# Patient Record
Sex: Male | Born: 1940 | Race: White | Hispanic: No | Marital: Married | Smoking: Former smoker
Health system: Southern US, Community
[De-identification: ages and names within clinical notes are randomized; demographics above are authoritative.]

## PROBLEM LIST (undated history)

## (undated) DIAGNOSIS — R351 Nocturia: Secondary | ICD-10-CM

## (undated) DIAGNOSIS — L719 Rosacea, unspecified: Secondary | ICD-10-CM

## (undated) DIAGNOSIS — E785 Hyperlipidemia, unspecified: Secondary | ICD-10-CM

## (undated) DIAGNOSIS — I10 Essential (primary) hypertension: Secondary | ICD-10-CM

## (undated) DIAGNOSIS — K7030 Alcoholic cirrhosis of liver without ascites: Secondary | ICD-10-CM

## (undated) DIAGNOSIS — C4490 Unspecified malignant neoplasm of skin, unspecified: Secondary | ICD-10-CM

## (undated) HISTORY — DX: Hyperlipidemia, unspecified: E78.5

## (undated) HISTORY — DX: Rosacea, unspecified: L71.9

## (undated) HISTORY — DX: Essential (primary) hypertension: I10

## (undated) HISTORY — DX: Unspecified malignant neoplasm of skin, unspecified: C44.90

## (undated) HISTORY — DX: Alcoholic cirrhosis of liver without ascites: K70.30

## (undated) HISTORY — DX: Nocturia: R35.1

## (undated) HISTORY — PX: TONSILLECTOMY: SHX5217

---

## 1998-02-15 ENCOUNTER — Ambulatory Visit (HOSPITAL_COMMUNITY): Admission: RE | Admit: 1998-02-15 | Discharge: 1998-02-15 | Payer: Self-pay | Admitting: Gastroenterology

## 2001-08-18 ENCOUNTER — Encounter (INDEPENDENT_AMBULATORY_CARE_PROVIDER_SITE_OTHER): Payer: Self-pay | Admitting: *Deleted

## 2001-08-18 ENCOUNTER — Ambulatory Visit (HOSPITAL_COMMUNITY): Admission: RE | Admit: 2001-08-18 | Discharge: 2001-08-18 | Payer: Self-pay | Admitting: Gastroenterology

## 2007-06-06 ENCOUNTER — Inpatient Hospital Stay (HOSPITAL_BASED_OUTPATIENT_CLINIC_OR_DEPARTMENT_OTHER): Admission: RE | Admit: 2007-06-06 | Discharge: 2007-06-06 | Payer: Self-pay | Admitting: Cardiovascular Disease

## 2009-06-22 ENCOUNTER — Ambulatory Visit: Admission: RE | Admit: 2009-06-22 | Discharge: 2009-07-11 | Payer: Self-pay | Admitting: Radiation Oncology

## 2009-07-18 ENCOUNTER — Inpatient Hospital Stay (HOSPITAL_COMMUNITY): Admission: RE | Admit: 2009-07-18 | Discharge: 2009-07-19 | Payer: Self-pay | Admitting: Urology

## 2009-07-18 ENCOUNTER — Encounter (INDEPENDENT_AMBULATORY_CARE_PROVIDER_SITE_OTHER): Payer: Self-pay | Admitting: Urology

## 2010-01-29 HISTORY — PX: OTHER SURGICAL HISTORY: SHX169

## 2010-04-16 LAB — TYPE AND SCREEN: ABO/RH(D): B POS

## 2010-04-16 LAB — HEMOGLOBIN AND HEMATOCRIT, BLOOD
HCT: 32.6 % — ABNORMAL LOW (ref 39.0–52.0)
Hemoglobin: 13.1 g/dL (ref 13.0–17.0)

## 2010-04-16 LAB — ABO/RH: ABO/RH(D): B POS

## 2010-04-17 LAB — BASIC METABOLIC PANEL
BUN: 18 mg/dL (ref 6–23)
Calcium: 9.8 mg/dL (ref 8.4–10.5)
Glucose, Bld: 106 mg/dL — ABNORMAL HIGH (ref 70–99)
Potassium: 3.7 mEq/L (ref 3.5–5.1)
Sodium: 139 mEq/L (ref 135–145)

## 2010-04-17 LAB — CBC
HCT: 41.7 % (ref 39.0–52.0)
Hemoglobin: 14.7 g/dL (ref 13.0–17.0)
MCHC: 35.4 g/dL (ref 30.0–36.0)
RBC: 4.23 MIL/uL (ref 4.22–5.81)
RDW: 12 % (ref 11.5–15.5)
WBC: 5.5 10*3/uL (ref 4.0–10.5)

## 2010-06-13 NOTE — Cardiovascular Report (Signed)
NAME:  [name], [name] NO.:  [id number]   MEDICAL RECORD NO.:  [id number]          PATIENT TYPE:  OIB   LOCATION:  1965                         FACILITY:  [facility]   PHYSICIAN:  Vesta Mixer, M.D. DATE OF BIRTH:  [birth year]   DATE OF PROCEDURE:  DATE OF DISCHARGE:                            CARDIAC CATHETERIZATION   HISTORY:  Devin Reeves is a 70 year old gentleman with a recent onset  of chest pain with exertion.  He is referred for heart catheterization  for further evaluation.   PROCEDURE:  Left heart catheterization with coronary angiography.   The right femoral artery was easily cannulated using a modified  Seldinger technique.   HEMODYNAMIC RESULTS:  LV pressure was 130/12 with an aortic pressure of  126/60.   Angiography, left main.  The left main is fairly normal.  There is a  small amount of calcification.   Left anterior descending artery is mildly to moderately calcified.  There is mild irregularities in the proximal segment between 20 and 30%.  There is a relatively small diagonal artery, which is unremarkable.  The  remainder of LAD is unremarkable.   The left circumflex artery is large.  There are minor luminal  irregularities.  There is a large first obtuse marginal system.   Right coronary artery is large and dominant.  There is a 20-30% mid  stent stenosis followed by a 20% stenosis more distally.  The posterior  descending artery is a moderate-sized vessel, which is unremarkable.   Left ventriculogram was performed in the 30 RAO position.  It reveals  normal left ventricular systolic function.  Ejection fraction is about  55%.  There is no significant mitral regurgitation.   COMPLICATIONS:  None.   CONCLUSION:  Minimal coronary artery irregularities.  I did not see any  culprit lesion to explain his episodes of chest pain.  We will continue  with medical therapy.           ______________________________  Vesta Mixer,  M.D.     PJN/MEDQ  D:  06/06/2007  T:  06/06/2007  Job:  [id number]   cc:   Marjory Lies, M.D.

## 2010-06-13 NOTE — H&P (Signed)
NAME:  Devin Reeves, Devin Reeves NO.:  [id number]   MEDICAL RECORD NO.:  [id number]           PATIENT TYPE:   LOCATION:                                 FACILITY:   PHYSICIAN:  Vesta Mixer, M.D.      DATE OF BIRTH:   DATE OF ADMISSION:  DATE OF DISCHARGE:                              HISTORY & PHYSICAL   [name] is a middle-aged gentleman with a recent onset of chest  pain.  He has been referred for heart catheterization for further  evaluation.   [name] is a 70 year old gentleman with a history of hypertension  and hypercholesterolemia.  He has been quite active and has been quite  healthy for many years.  He typically walks approximately 4 miles 5 days  a week.  He has done this for a year, tells he has never had any  problems.   Starting this past Sunday, he developed intense chest pressure with  walking.  He walks to the golf course at [facility] and really pushes it  going up to 18th hole.  On Sunday, he developed some intense chest  pressure.  It eased off when he stopped walking.  He has walked several  times since that time, but really has not pushed it quite the same way.  He has noticed similar episodes of chest discomfort, but not as intense.  He does not have any episodes of chest pain at rest.  He denies any PND  or orthopnea.  He denies any heat or cold intolerance.   CURRENT MEDICATIONS:  1. Altace 10 mg a day.  2. Hydrochlorothiazide 25 mg a day.  3. Aspirin 81 mg a day.  4. Multivitamin once a day.   ALLERGIES:  He is allergic to STEROIDS and is allergic to OYSTERS.   PAST MEDICAL HISTORY:  1. Hypertension.  2. Hypercholesterolemia.   SOCIAL HISTORY:  The patient used to smoke, but quit 3-4 years ago.  He  drinks a bottle wine per day.  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  He walks 4 miles a day 5 days a week.   FAMILY HISTORY:  Father died in his 41s due to unknown causes.  His  mother is 60 years  old, who is relatively healthy.   REVIEW OF SYSTEMS:  Reviewed and is essentially negative, except for as  noted in the HPI.   PHYSICAL EXAMINATION:  On exam,  GENERAL:  He is a middle-aged gentleman in no acute distress.  He is  alert and oriented x3, and his mood and affect are normal.  VITAL SIGNS:  His weight is 173, blood pressure 140/70 with a heart rate  of 72.  HEENT EXAM:  Reveals 2+ carotids.  He has no bruits, no JVD, and no  thyromegaly.  LUNGS:  Clear to auscultation.  HEART:  Regular rate S1-S2.  ABDOMINAL EXAM:  Reveals good bowel sounds and is nontender.  EXTREMITIES:  He has no clubbing, cyanosis or edema.  NEUROLOGICAL EXAM:  Nonfocal.   His EKG from Dr. Lucie Leather office reveals sinus bradycardia.  There  are no ST or T-wave changes.   [name] presents with episodes of chest pain that are somewhat worrisome for  unstable angina.  He had intense chest pain while walking briskly and  has had lesser episodes of chest pain while walking slower.  At this  point, I think that we should refer him for heart catheterization.  I do  not think that a stress test would help as much since he is having  angina every time he walks.  We will schedule him for an outpatient  heart catheterization.  We have discussed the risks, benefits and  options of heart catheterization.  He understands and agrees to proceed.  All of his other medical problems remain stable.           ______________________________  Vesta Mixer, M.D.     PJN/MEDQ  D:  06/05/2007  T:  06/06/2007  Job:  [id number]   cc:   Marjory Lies, M.D.

## 2010-06-16 NOTE — Procedures (Signed)
[facility]  Patient:    Devin Reeves, Devin Reeves Visit Number: [id number] MRN: [id number]          Service Type: END Location: ENDO Attending Physician:  Rich Brave Dictated by:   Florencia Reasons, M.D. Proc. Date: 08/18/01 Admit Date:  08/18/2001 Discharge Date: 08/18/2001   CC:         Delorse Lek, M.D.   Procedure Report  PROCEDURE:  Colonoscopy with biopsies.  SURGEON:  Florencia Reasons, M.D.  INDICATIONS:  A 70 year old for follow up of prior history of a small colonic adenoma, having been removed approximately four or five years ago.  His most recent surveillance examination, 3-1/2 years ago, was negative for any adenomas.  FINDINGS:  Scattered pancolonic diverticulosis.  DESCRIPTION OF PROCEDURE:  The nature, purpose, and risks of the procedure were familiar to the patient from prior examination, and he provided written consent.  Sedation was fentanyl 50 mcg and Versed 6 mg IV without arrhythmias or desaturation.  The Olympus adult video colonoscope was advanced to the cecum without difficulty following an unremarkable digital exam of the prostate.  There was some caution required in the sigmoid region due to diverticular there, and on at least one occasion, there was a diverticular orifice that looked like the lumen.  Pull back was then performed.  The quality of the prep was very good and it was felt that all areas were well-seen.  The patient had several (approximately six) diminutive 2 mm sessile polyps in the rectum, starting at 20 cm, and continuing down to the anal canal.  These were removed by one or more cold biopsies each.  No large polyps, cancer, colitis, or vascular malformations were observed.  Antegrade viewing disclosed no large or worrisome distal rectal pathology. Reinspection of the rectosigmoid disclosed no additional findings.  The patient did have moderate sigmoid diverticulosis and mild  right-sided diverticulosis.  The patient tolerated the procedure well and there were no apparent complications.  IMPRESSION: 1. Several diminutive rectal polyps, removed.  None of these were worrisome in    appearance. 2. Pancolonic diverticulosis.  PLAN:  Await pathology.  Probable colonoscopic follow up in five years. Dictated by:   Florencia Reasons, M.D. Attending Physician:  Rich Brave DD:  08/18/01 TD:  08/21/01 Job: [id number]

## 2011-05-10 ENCOUNTER — Encounter: Payer: Self-pay | Admitting: *Deleted

## 2011-05-10 DIAGNOSIS — R351 Nocturia: Secondary | ICD-10-CM | POA: Insufficient documentation

## 2011-07-10 ENCOUNTER — Other Ambulatory Visit: Payer: Self-pay

## 2011-07-16 ENCOUNTER — Other Ambulatory Visit: Payer: Self-pay | Admitting: Dermatology

## 2013-12-02 ENCOUNTER — Emergency Department (HOSPITAL_COMMUNITY)
Admission: EM | Admit: 2013-12-02 | Discharge: 2013-12-02 | Disposition: A | Discharge status: Home / Self Care | Payer: Medicare Other | Attending: Emergency Medicine | Admitting: Emergency Medicine

## 2013-12-02 ENCOUNTER — Encounter (HOSPITAL_COMMUNITY): Payer: Self-pay

## 2013-12-02 DIAGNOSIS — W228XXA Striking against or struck by other objects, initial encounter: Secondary | ICD-10-CM | POA: Insufficient documentation

## 2013-12-02 DIAGNOSIS — Z23 Encounter for immunization: Secondary | ICD-10-CM | POA: Insufficient documentation

## 2013-12-02 DIAGNOSIS — Y9389 Activity, other specified: Secondary | ICD-10-CM | POA: Insufficient documentation

## 2013-12-02 DIAGNOSIS — Z7982 Long term (current) use of aspirin: Secondary | ICD-10-CM | POA: Diagnosis not present

## 2013-12-02 DIAGNOSIS — Z872 Personal history of diseases of the skin and subcutaneous tissue: Secondary | ICD-10-CM | POA: Diagnosis not present

## 2013-12-02 DIAGNOSIS — S0181XA Laceration without foreign body of other part of head, initial encounter: Secondary | ICD-10-CM

## 2013-12-02 DIAGNOSIS — Z8639 Personal history of other endocrine, nutritional and metabolic disease: Secondary | ICD-10-CM | POA: Diagnosis not present

## 2013-12-02 DIAGNOSIS — I10 Essential (primary) hypertension: Secondary | ICD-10-CM | POA: Insufficient documentation

## 2013-12-02 DIAGNOSIS — Y9289 Other specified places as the place of occurrence of the external cause: Secondary | ICD-10-CM | POA: Insufficient documentation

## 2013-12-02 DIAGNOSIS — S0511XA Contusion of eyeball and orbital tissues, right eye, initial encounter: Secondary | ICD-10-CM

## 2013-12-02 DIAGNOSIS — Z87891 Personal history of nicotine dependence: Secondary | ICD-10-CM | POA: Diagnosis not present

## 2013-12-02 DIAGNOSIS — Z79899 Other long term (current) drug therapy: Secondary | ICD-10-CM | POA: Diagnosis not present

## 2013-12-02 DIAGNOSIS — S0591XA Unspecified injury of right eye and orbit, initial encounter: Secondary | ICD-10-CM | POA: Diagnosis present

## 2013-12-02 MED ORDER — TETRACAINE HCL 0.5 % OP SOLN
2.0000 [drp] | Freq: Once | OPHTHALMIC | Status: AC
  Administered 2013-12-02: 2 [drp] via OPHTHALMIC
  Filled 2013-12-02: qty 2

## 2013-12-02 MED ORDER — FLUORESCEIN SODIUM 1 MG OP STRP
1.0000 | ORAL_STRIP | Freq: Once | OPHTHALMIC | Status: AC
  Administered 2013-12-02: 1 via OPHTHALMIC
  Filled 2013-12-02: qty 1

## 2013-12-02 MED ORDER — TETANUS-DIPHTH-ACELL PERTUSSIS 5-2.5-18.5 LF-MCG/0.5 IM SUSP
0.5000 mL | Freq: Once | INTRAMUSCULAR | Status: AC
  Administered 2013-12-02: 0.5 mL via INTRAMUSCULAR
  Filled 2013-12-02: qty 0.5

## 2013-12-02 NOTE — Discharge Instructions (Signed)
Eye Contusion An eye contusion is a deep bruise of the eye. This is often called a "black eye." Contusions are the result of an injury that caused bleeding under the skin. The contusion may turn blue, purple, or yellow. Minor injuries will give you a painless contusion, but more severe contusions may stay painful and swollen for a few weeks. If the eye contusion only involves the eyelids and tissues around the eye, the injured area will get better within a few days to weeks. However, eye contusions can be serious and affect the eyeball and sight. CAUSES   Blunt injury or trauma to the face or eye area.  A forehead injury that causes the blood under the skin to work its way down to the eyelids.  Rubbing the eyes due to irritation. SYMPTOMS   Swelling and redness around the eye.  Bruising around the eye.  Tenderness, soreness, or pain around the eye.  Blurry vision.  Tearing.  Eyeball redness. DIAGNOSIS  A diagnosis is usually based on a thorough exam of the eye and surrounding area. The eye must be looked at carefully to make sure it is not injured and to make sure nothing else will threaten your vision. A vision test may be done. An X-ray or computed tomography (CT) scan may be needed to determine if there are any associated injuries, such as broken bones (fractures). TREATMENT  If there is an injury to the eye, treatment will be determined by the nature of the injury. HOME CARE INSTRUCTIONS   Put ice on the injured area.  Put ice in a plastic bag.  Place a towel between your skin and the bag.  Leave the ice on for 15-20 minutes, 03-04 times a day.  If it is determined that there is no injury to the eye, you may continue normal activities.  Sunglasses may be worn to protect your eyes from bright light if light is uncomfortable.  Sleep with your head elevated. You can put an extra pillow under your head. This may help with discomfort.  Only take over-the-counter or  prescription medicines for pain, discomfort, or fever as directed by your caregiver. Do not take aspirin for the first few days. This may increase bruising. SEEK IMMEDIATE MEDICAL CARE IF:   You have any form of vision loss.  You have double vision.  You feel nauseous.  You feel dizzy, sleepy, or like you will faint.  You have any fluid discharge from the eye or your nose.  You have swelling and discoloration that does not fade. MAKE SURE YOU:   Understand these instructions.  Will watch your condition.  Will get help right away if you are not doing well or get worse. Document Released: 01/13/2000 Document Revised: 04/09/2011 Document Reviewed: 12/01/2010 [facility] Patient Information 2015 [place], [place]. This information is not intended to replace advice given to you by your health care provider. Make sure you discuss any questions you have with your health care provider.  Tissue Adhesive Wound Care Some cuts, wounds, lacerations, and incisions can be repaired by using tissue adhesive. Tissue adhesive is like glue. It holds the skin together, allowing for faster healing. It forms a strong bond on the skin in about 1 minute and reaches its full strength in about 2 or 3 minutes. The adhesive disappears naturally while the wound is healing. It is important to take proper care of your wound at home while it heals.  HOME CARE INSTRUCTIONS   Showers are allowed. Do not soak  the area containing the tissue adhesive. Do not take baths, swim, or use hot tubs. Do not use any soaps or ointments on the wound. Certain ointments can weaken the glue.  If a bandage (dressing) has been applied, follow your health care provider's instructions for how often to change the dressing.   Keep the dressing dry if one has been applied.   Do not scratch, pick, or rub the adhesive.   Do not place tape over the adhesive. The adhesive could come off when pulling the tape off.   Protect the wound from  further injury until it is healed.   Protect the wound from sun and tanning bed exposure while it is healing and for several weeks after healing.   Only take over-the-counter or prescription medicines as directed by your health care provider.   Keep all follow-up appointments as directed by your health care provider. SEEK IMMEDIATE MEDICAL CARE IF:   Your wound becomes red, swollen, hot, or tender.   You develop a rash after the glue is applied.  You have increasing pain in the wound.   You have a red streak that goes away from the wound.   You have pus coming from the wound.   You have increased bleeding.  You have a fever.  You have shaking chills.   You notice a bad smell coming from the wound.   Your wound or adhesive breaks open.  MAKE SURE YOU:   Understand these instructions.  Will watch your condition.  Will get help right away if you are not doing well or get worse. Document Released: 07/11/2000 Document Revised: 11/05/2012 Document Reviewed: 08/06/2012 [facility] Patient Information 2015 [place], [place]. This information is not intended to replace advice given to you by your health care provider. Make sure you discuss any questions you have with your health care provider.

## 2013-12-02 NOTE — ED Notes (Signed)
Pt was working with wood item.  Attempting to pull dowel from wood.  Pulled out and it hit eye and upper cheek area to right face.   Pt states some blurry vision.  Redness noted but no bleeding or laceration visualized.

## 2013-12-02 NOTE — ED Provider Notes (Signed)
CSN: [id number]     Arrival date & time 12/02/13  1237 History   First MD Initiated Contact with Patient 12/02/13 1339     Chief Complaint  Patient presents with  . Facial Injury  . Eye Injury      HPI Comments: Devin Reeves presents for evaluation of eye injury.  Devin Reeves was pulling back on a wooden dowel and it struck him in the right periorbital region. Devin Reeves sustained a laceration to the right infraorbital region and Devin Reeves reports pain on the right upper eye itself not the orbit. Devin Reeves has moderate pain in the eye and pain with range of motion. Devin Reeves denies change in his vision, diplopia, headaches, nausea, vomiting, or any additional symptoms. Symptoms are moderate and constant.  Patient is a 72 y.o. male presenting with facial injury and eye injury. The history is provided by the patient.  Facial Injury Mechanism of injury:  Direct blow Injury location: right periorbital region. Time since incident:  2 hours Pain details:    Severity:  Moderate   Duration:  2 hours   Timing:  Constant   Progression:  Unchanged Chronicity:  New Foreign body present:  No foreign bodies Associated symptoms: no altered mental status, no double vision, no headaches, no loss of consciousness and no neck pain   Eye Injury Pertinent negatives include no headaches.    Past Medical History  Diagnosis Date  . Hypertension   . Hyperlipidemia   . Nocturia   . Acne rosacea    Past Surgical History  Procedure Laterality Date  . Tonsillectomy     History reviewed. No pertinent family history. History  Substance Use Topics  . Smoking status: Former Research scientist (life sciences)  . Smokeless tobacco: Not on file  . Alcohol Use: Yes    Review of Systems  Eyes: Negative for double vision.  Musculoskeletal: Negative for neck pain.  Neurological: Negative for loss of consciousness and headaches.  All other systems reviewed and are negative.     Allergies  Oysters  Home Medications   Prior to Admission medications    Medication Sig Start Date End Date Taking? Authorizing Provider  aspirin 81 MG tablet Take 81 mg by mouth daily.    Historical Provider, MD  hydrochlorothiazide (HYDRODIURIL) 25 MG tablet Take 25 mg by mouth daily.    Historical Provider, MD  Multiple Vitamin (MULTIVITAMIN) capsule Take 1 capsule by mouth daily.    Historical Provider, MD  ramipril (ALTACE) 10 MG capsule Take 10 mg by mouth daily.    Historical Provider, MD   There were no vitals taken for this visit. Physical Exam  Constitutional: Devin Reeves is oriented to person, place, and time. Devin Reeves appears well-developed and well-nourished.  HENT:  There is approximate 1 cm laceration in the right infraorbital region that is hemostatic and well approximated. There is no bony tenderness to the facial region.  Eyes: Pupils are equal, round, and reactive to light.  Ocular pressures on the right 23 and on the left 20.  There is mild conjunctival injection on the right. There are no corneal abrasions on fluorescein staining. Extraocular movements intact. There is no proptosis. Patient did have relief of his eye pain with tetracaine drops.  Neck: Neck supple.  Pulmonary/Chest:  No respiratory distress  Neurological: Devin Reeves is alert and oriented to person, place, and time.  Skin: Skin is warm and dry.  Psychiatric: Devin Reeves has a normal mood and affect.  Nursing note and vitals reviewed.   ED Course  Procedures (including  critical care time) LACERATION REPAIR Performed by: Quintella Reichert Authorized by: Quintella Reichert Consent: Verbal consent obtained. Risks and benefits: risks, benefits and alternatives were discussed Consent given by: patient Patient identity confirmed: provided demographic data Prepped and Draped in normal sterile fashion Wound explored  Laceration Location: right infraorbital region  Laceration Length: 1cm  No Foreign Bodies seen or palpated  Anesthesia: none  Irrigation method: syringe, normal saline.  Wound clean and with  chlorhexidine Amount of cleaning: standard  Skin closure: Dermabond  Patient tolerance: Patient tolerated the procedure well with no immediate complications.  Labs Review Labs Reviewed - No data to display  Imaging Review No results found.   EKG Interpretation None      MDM   Final diagnoses:  Eye contusion, right, initial encounter  Facial laceration, initial encounter    Clinical picture not consistent with significant closed head injury. There is no evidence of hyphema on exam. History and exam is not consistent with retrobulbar hematoma or open globe.  Discussed home care for facial laceration as well as return precautions for eye contusion and importance of outpatient ophtho follow-up.    Quintella Reichert, MD 12/02/13 [phone]

## 2016-01-24 ENCOUNTER — Other Ambulatory Visit: Payer: Self-pay | Admitting: Physician Assistant

## 2016-01-24 DIAGNOSIS — R0989 Other specified symptoms and signs involving the circulatory and respiratory systems: Secondary | ICD-10-CM

## 2016-01-26 ENCOUNTER — Ambulatory Visit
Admission: RE | Admit: 2016-01-26 | Discharge: 2016-01-26 | Disposition: A | Discharge status: Home / Self Care | Payer: Medicare Other | Source: Ambulatory Visit | Attending: Physician Assistant | Admitting: Physician Assistant

## 2016-01-26 DIAGNOSIS — R0989 Other specified symptoms and signs involving the circulatory and respiratory systems: Secondary | ICD-10-CM

## 2017-09-10 ENCOUNTER — Emergency Department (HOSPITAL_COMMUNITY): Payer: Medicare Other

## 2017-09-10 ENCOUNTER — Emergency Department (HOSPITAL_COMMUNITY)
Admission: EM | Admit: 2017-09-10 | Discharge: 2017-09-10 | Disposition: A | Discharge status: Home / Self Care | Payer: Medicare Other | Attending: Emergency Medicine | Admitting: Emergency Medicine

## 2017-09-10 ENCOUNTER — Encounter (HOSPITAL_COMMUNITY): Payer: Self-pay | Admitting: Family Medicine

## 2017-09-10 DIAGNOSIS — T675XXA Heat exhaustion, unspecified, initial encounter: Secondary | ICD-10-CM | POA: Diagnosis not present

## 2017-09-10 DIAGNOSIS — Z87891 Personal history of nicotine dependence: Secondary | ICD-10-CM | POA: Insufficient documentation

## 2017-09-10 DIAGNOSIS — I10 Essential (primary) hypertension: Secondary | ICD-10-CM | POA: Diagnosis not present

## 2017-09-10 DIAGNOSIS — R410 Disorientation, unspecified: Secondary | ICD-10-CM | POA: Diagnosis not present

## 2017-09-10 DIAGNOSIS — Z79899 Other long term (current) drug therapy: Secondary | ICD-10-CM | POA: Diagnosis not present

## 2017-09-10 DIAGNOSIS — R4182 Altered mental status, unspecified: Secondary | ICD-10-CM | POA: Diagnosis present

## 2017-09-10 LAB — RAPID URINE DRUG SCREEN, HOSP PERFORMED
AMPHETAMINES: NOT DETECTED
Barbiturates: NOT DETECTED
Benzodiazepines: NOT DETECTED
Cocaine: NOT DETECTED
Tetrahydrocannabinol: NOT DETECTED

## 2017-09-10 LAB — URINALYSIS, ROUTINE W REFLEX MICROSCOPIC
BACTERIA UA: NONE SEEN
BILIRUBIN URINE: NEGATIVE
Glucose, UA: NEGATIVE mg/dL
Ketones, ur: 5 mg/dL — AB
Leukocytes, UA: NEGATIVE
NITRITE: NEGATIVE
Protein, ur: NEGATIVE mg/dL
Specific Gravity, Urine: 1.015 (ref 1.005–1.030)
pH: 6 (ref 5.0–8.0)

## 2017-09-10 LAB — COMPREHENSIVE METABOLIC PANEL
ALBUMIN: 2.8 g/dL — AB (ref 3.5–5.0)
ALK PHOS: 131 U/L — AB (ref 38–126)
ALT: 99 U/L — ABNORMAL HIGH (ref 0–44)
ANION GAP: 8 (ref 5–15)
AST: 108 U/L — AB (ref 15–41)
BILIRUBIN TOTAL: 2.6 mg/dL — AB (ref 0.3–1.2)
BUN: 17 mg/dL (ref 8–23)
CALCIUM: 9.2 mg/dL (ref 8.9–10.3)
CO2: 29 mmol/L (ref 22–32)
Chloride: 102 mmol/L (ref 98–111)
Creatinine, Ser: 0.9 mg/dL (ref 0.61–1.24)
GFR calc Af Amer: >60 mL/min (ref >60)
GFR calc non Af Amer: >60 mL/min (ref >60)
Glucose, Bld: 90 mg/dL (ref 70–99)
Potassium: 4.2 mmol/L (ref 3.5–5.1)
Sodium: 139 mmol/L (ref 135–145)
TOTAL PROTEIN: 6.5 g/dL (ref 6.5–8.1)

## 2017-09-10 LAB — CBC
HCT: 40.5 % (ref 39.0–52.0)
Hemoglobin: 13.6 g/dL (ref 13.0–17.0)
MCH: 33.8 pg (ref 26.0–34.0)
MCHC: 33.6 g/dL (ref 30.0–36.0)
MCV: 100.7 fL — ABNORMAL HIGH (ref 78.0–100.0)
PLATELETS: 145 10*3/uL — AB (ref 150–400)
RBC: 4.02 MIL/uL — AB (ref 4.22–5.81)
RDW: 14.1 % (ref 11.5–15.5)
WBC: 7.3 10*3/uL (ref 4.0–10.5)

## 2017-09-10 LAB — I-STAT TROPONIN, ED: Troponin i, poc: 0.03 ng/mL (ref 0.00–0.08)

## 2017-09-10 LAB — PROTIME-INR
INR: 1.21
PROTHROMBIN TIME: 15.2 s (ref 11.4–15.2)

## 2017-09-10 LAB — APTT: aPTT: 34 seconds (ref 24–36)

## 2017-09-10 LAB — ETHANOL: Alcohol, Ethyl (B): <10 mg/dL (ref <10)

## 2017-09-10 LAB — DIFFERENTIAL
BASOS ABS: 0 10*3/uL (ref 0.0–0.1)
Basophils Relative: 0 %
EOS ABS: 0 10*3/uL (ref 0.0–0.7)
Eosinophils Relative: 0 %
Lymphocytes Relative: 11 %
Lymphs Abs: 0.8 10*3/uL (ref 0.7–4.0)
Monocytes Absolute: 0.6 10*3/uL (ref 0.1–1.0)
Monocytes Relative: 8 %
Neutro Abs: 5.9 10*3/uL (ref 1.7–7.7)
Neutrophils Relative %: 81 %

## 2017-09-10 LAB — CBG MONITORING, ED: GLUCOSE-CAPILLARY: 76 mg/dL (ref 70–99)

## 2017-09-10 MED ORDER — SODIUM CHLORIDE 0.9 % IV BOLUS
1000.0000 mL | Freq: Once | INTRAVENOUS | Status: AC
  Administered 2017-09-10: 1000 mL via INTRAVENOUS

## 2017-09-10 NOTE — ED Triage Notes (Addendum)
Patient is from home and transported via [facility]. Patient was seen by Harlow Mares, DPM today for left lower extremity pain and swelling. A duplex venous ultrasound performed for possible DVT. Unknown about results. Patient returned home with spouse from appointment. Later, patient was found by his spouse in the car with no air condition about 12:30. At this time, spouse reported to EMS that he was altered, had a shuffling gait, and extremely hot. She placed in bed and attempted to perform cooling measures. At 16:30, patient still was altered. EMS reports he did answer orientation questions appropriate and is alert.

## 2017-09-10 NOTE — ED Notes (Signed)
ED Provider at bedside. 

## 2017-09-10 NOTE — Discharge Instructions (Addendum)
Please read and follow all provided instructions.  Your diagnoses today include:  1. Heat exhaustion, initial encounter   2. Delirium    Work up here was reassuring. Please follow up with your primary care provider. Please make sure you are drinking about 6 glasses of water a day. Make sure you are staying cool in this heat.   Tests performed today include: Vital signs. See below for your results today.   Medications prescribed:  Take as prescribed   Home care instructions:  Follow any educational materials contained in this packet.  Follow-up instructions: Please follow-up with your primary care provider for further evaluation of symptoms and treatment   Return instructions:  Please return to the Emergency Department if you do not get better, if you get worse, or new symptoms OR  - Fever (temperature greater than 101.32F)  - Bleeding that does not stop with holding pressure to the area    -Severe pain (please note that you may be more sore the day after your accident)  - Chest Pain  - Difficulty breathing  - Severe nausea or vomiting  - Inability to tolerate food and liquids  - Passing out  - Skin becoming red around your wounds  - Change in mental status (confusion or lethargy)  - New numbness or weakness    Please return if you have any other emergent concerns.  Additional Information:  Your vital signs today were: BP (!) 147/60    Pulse 76    Temp 99.2 F (37.3 C) (Oral)    Resp (!) 22    SpO2 93%  If your blood pressure (BP) was elevated above 135/85 this visit, please have this repeated by your doctor within one month. ---------------

## 2017-09-10 NOTE — ED Provider Notes (Signed)
Devin Reeves Note   CSN: [id number] Arrival date & time: 09/10/17  1715     History   Chief Complaint Chief Complaint  Patient presents with  . Altered Mental Status    HPI Devin Reeves is a 77 y.o. male.  Devin Reeves is a 77 y.o. Male who presents to the emergency department via EMS with altered mental status and heat exhaustion today.  Patient tells me he went to see his podiatrist today and afterwards was waiting in his car with a car turned off to find a restaurant to eat with his friend.  He became very hot while in the car.  When asked why he was sitting in the car with the vehicle turned off he tells me because he was "being stupid."  His wife reports that she found him in the driveway at home with the car turned off very flushed.  She helped him out of the car and he had trouble walking to the house due to pain all over.  She did not notice any focal weakness.  He was not confused, but she reports he was less talkative than usual.  He had no seizure-like activity. This was all around 12:30 pm today.  The wife brought him inside and placed cold packs on him to cool him off. She also gave him apple juice. She reports he was still very warm to touch around 4 pm today and she decided to call 911. EMS reports he was oriented on arrival. Patient tells me he is feeling much better now. He reports he was feeling very hot earlier.  He denies any complaints currently.  He did have a DVT ultrasound today and according to medical records this was negative.  He reports he had chronic bilateral lower extremity edema for quite a while now.  Patient denies fevers, abdominal pain, nausea, vomiting, diarrhea, headache, numbness, tingling, weakness, dysuria, chest pain or shortness of breath.   The history is provided by the patient, medical records, the spouse and the EMS personnel. No language interpreter was used.  Altered Mental Status   Associated  symptoms include weakness (generalized ).    Past Medical History:  Diagnosis Date  . Acne rosacea   . Hyperlipidemia   . Hypertension   . Nocturia     Patient Active Problem List   Diagnosis Date Noted  . Nocturia     Past Surgical History:  Procedure Laterality Date  . TONSILLECTOMY          Home Medications    Prior to Admission medications   Medication Sig Start Date End Date Taking? Authorizing Reeves  Ascorbic Acid (VITAMIN C) 100 MG tablet Take 300 mg by mouth daily.   Yes Reeves, Historical, MD  furosemide (LASIX) 40 MG tablet Take 40 mg by mouth daily. 08/06/17  Yes Reeves, Historical, MD  hydrochlorothiazide (HYDRODIURIL) 25 MG tablet Take 25 mg by mouth daily.   Yes Reeves, Historical, MD  triamcinolone cream (KENALOG) 0.1 % Apply 1 application topically 2 (two) times daily.   Yes Reeves, Historical, MD    Family History History reviewed. No pertinent family history.  Social History Social History   Tobacco Use  . Smoking status: Former Research scientist (life sciences)  . Smokeless tobacco: Never Used  Substance Use Topics  . Alcohol use: Yes  . Drug use: No     Allergies   Oysters [shellfish allergy]; Doxycycline; and Hydroxyzine hcl   Review of Systems Review of Systems  Constitutional:  Positive for fatigue. Negative for chills and fever.  HENT: Negative for congestion and sore throat.   Eyes: Negative for visual disturbance.  Respiratory: Negative for cough, shortness of breath and wheezing.   Cardiovascular: Negative for chest pain and palpitations.  Gastrointestinal: Negative for abdominal pain, diarrhea, nausea and vomiting.  Genitourinary: Negative for dysuria.  Musculoskeletal: Negative for back pain and neck pain.  Skin: Negative for rash.  Neurological: Positive for weakness (generalized ). Negative for headaches.     Physical Exam Updated Vital Signs BP (!) 147/60   Pulse 76   Temp 99.2 F (37.3 C) (Oral)   Resp (!) 22   SpO2 93%    Physical Exam  Constitutional: He is oriented to person, place, and time. He appears well-developed and well-nourished. No distress.  Nontoxic-appearing.  HENT:  Head: Normocephalic and atraumatic.  Mouth/Throat: Oropharynx is clear and moist.  Eyes: Pupils are equal, round, and reactive to light. Conjunctivae are normal. Right eye exhibits no discharge. Left eye exhibits no discharge.  Neck: Neck supple.  Cardiovascular: Normal rate, regular rhythm, normal heart sounds and intact distal pulses. Exam reveals no gallop and no friction rub.  No murmur heard. Pulmonary/Chest: Effort normal and breath sounds normal. No respiratory distress. He has no wheezes. He has no rales.  Abdominal: Soft. There is no tenderness. There is no guarding.  Musculoskeletal: Normal range of motion. He exhibits edema. He exhibits no tenderness.  Bilateral lower extremity edema.  Lymphadenopathy:    He has no cervical adenopathy.  Neurological: He is alert and oriented to person, place, and time. No cranial nerve deficit or sensory deficit. He exhibits normal muscle tone. Coordination normal.  Patient is alert and oriented 3. Speech is clear and coherent. Cranial nerves are intact. Sensation and strength is intact to bilateral upper and lower extremities. No pronator drift.  Finger-to-nose intact bilaterally.  EOMs are intact.  Vision is grossly intact.  Skin: Skin is warm and dry. Capillary refill takes less than 2 seconds. No rash noted. He is not diaphoretic. No erythema. No pallor.  Psychiatric: He has a normal mood and affect. His behavior is normal.  Nursing note and vitals reviewed.    ED Treatments / Results  Labs (all labs ordered are listed, but only abnormal results are displayed) Labs Reviewed  COMPREHENSIVE METABOLIC PANEL - Abnormal; Notable for the following components:      Result Value   Albumin 2.8 (*)    AST 108 (*)    ALT 99 (*)    Alkaline Phosphatase 131 (*)    Total Bilirubin  2.6 (*)    All other components within normal limits  CBC - Abnormal; Notable for the following components:   RBC 4.02 (*)    MCV 100.7 (*)    Platelets 145 (*)    All other components within normal limits  RAPID URINE DRUG SCREEN, HOSP PERFORMED - Abnormal; Notable for the following components:   Opiates   (*)    Value: Result not available. Reagent lot number recalled by manufacturer.   All other components within normal limits  URINALYSIS, ROUTINE W REFLEX MICROSCOPIC - Abnormal; Notable for the following components:   Hgb urine dipstick SMALL (*)    Ketones, ur 5 (*)    All other components within normal limits  ETHANOL  PROTIME-INR  APTT  DIFFERENTIAL  CBG MONITORING, ED  I-STAT TROPONIN, ED    EKG EKG Interpretation  Date/Time:  Tuesday September 10 2017 17:38:13 EDT Ventricular Rate:  76 PR Interval:    QRS Duration: 105 QT Interval:  414 QTC Calculation: 466 R Axis:   65 Text Interpretation:  Sinus rhythm No significant change since last tracing Normal ECG Confirmed by Carmin Muskrat [phone]) on 09/10/2017 7:26:58 PM   Radiology Ct Head Wo Contrast  Result Date: 09/10/2017 CLINICAL DATA:  Altered level of consciousness and gait disturbance EXAM: CT HEAD WITHOUT CONTRAST TECHNIQUE: Contiguous axial images were obtained from the base of the skull through the vertex without intravenous contrast. COMPARISON:  None. FINDINGS: Brain: There is mild to moderate diffuse atrophy. There is no intracranial mass, hemorrhage, extra-axial fluid collection or midline shift. There is patchy small vessel disease in the centra semiovale bilaterally. Elsewhere gray-white compartments appear normal. No evident acute infarct. Vascular: No hyperdense vessels. There is calcification in each cavernous carotid artery region. Skull: The bony calvarium appears intact. Sinuses/Orbits: There is mucosal thickening in the inferior left maxillary antrum. There is mucosal thickening in several ethmoid air  cells. There is a retention cyst in the anterior left sphenoid sinus. Orbits appear symmetric bilaterally. Other: Mastoid air cells are clear. 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 No acute infarct evident. No mass or hemorrhage. Mild arterial vascular calcification noted. Multiple foci of paranasal sinus disease. Electronically Signed   By: Lowella Grip III M.D.   On: 09/10/2017 18:55    Procedures Procedures (including critical care time)  Medications Ordered in ED Medications  sodium chloride 0.9 % bolus 1,000 mL (0 mLs Intravenous Stopped 09/10/17 2025)     Initial Impression / Assessment and Plan / ED Course  I have reviewed the triage vital signs and the nursing notes.  Pertinent labs & imaging results that were available during my care of the patient were reviewed by me and considered in my medical decision making (see chart for details).     This is a 77 y.o. Male who presents to the emergency department via EMS with altered mental status and heat exhaustion today.  Patient tells me he went to see his podiatrist today and afterwards was waiting in his car with a car turned off to find a restaurant to eat with his friend.  He became very hot while in the car.  When asked why he was sitting in the car with the vehicle turned off he tells me because he was "being stupid."  His wife reports that she found him in the driveway at home with the car turned off very flushed.  She helped him out of the car and he had trouble walking to the house due to pain all over.  She did not notice any focal weakness.  He was not confused, but she reports he was less talkative than usual.  He had no seizure-like activity. This was all around 12:30 pm today.  The wife brought him inside and placed cold packs on him to cool him off. She also gave him apple juice. She reports he was still very warm to touch around 4 pm today and she decided to call 911. EMS reports he was oriented  on arrival. Patient tells me he is feeling much better now. He reports he was feeling very hot earlier.  He denies any complaints currently.  He did have a DVT ultrasound today and according to medical records this was negative.  He reports he had chronic bilateral lower extremity edema for quite a while now. On exam the patient is afebrile and nontoxic-appearing.  His speech is clear  and coherent.  He has no focal neurological deficits.  Lungs are clear to auscultation bilaterally.  Abdomen is soft and nontender to palpation. While patient seems to be at his baseline now my concern is that he was sitting in a hot car in the heat today and this is bizzare behavior. Will check labs and CT head.  EKG shows normal sinus rhythm and is unchanged from his last tracing. Troponin is not elevated.  CMP is normal for mildly elevated AST and ALT of 189 on respectively.  On chart review patient has similar labs on care everywhere.  This is not acute. CBC shows no leukocytosis.  Alcohol level is undetectable.  UDS is unremarkable.  Urinalysis is without sign of infection.  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 No acute infarct evident. No mass or hemorrhage. Mild arterial vascular calcification noted.  At recheck patient is resting comfortably in bed.  He feels ready for discharge.  He has no complaints.  Will discharge with close follow-up with primary care.  I encouraged him to stay cool in the seton to push oral hydration.  Return precautions discussed. I advised the patient to follow-up with their primary care Reeves this week. I advised the patient to return to the emergency department with new or worsening symptoms or new concerns. The patient verbalized understanding and agreement with plan.   This patient was discussed with and evaluated by Dr. Vanita Panda who agrees with assessment and plan.   Final Clinical Impressions(s) / ED Diagnoses   Final diagnoses:  Heat  exhaustion, initial encounter  Delirium    ED Discharge Orders    None       Sharmaine Base 09/10/17 2159    Carmin Muskrat, MD 09/10/17 [phone]

## 2017-09-10 NOTE — ED Notes (Signed)
Patient transported to CT 

## 2017-09-10 NOTE — ED Notes (Signed)
Bed: WA20 Expected date: 09/10/17 Expected time: 5:06 PM Means of arrival: Ambulance Comments: 77 yo found sitting in car, wife concerned

## 2017-09-16 ENCOUNTER — Emergency Department (HOSPITAL_COMMUNITY): Payer: Medicare Other

## 2017-09-16 ENCOUNTER — Inpatient Hospital Stay (HOSPITAL_COMMUNITY): Payer: Medicare Other

## 2017-09-16 ENCOUNTER — Inpatient Hospital Stay (HOSPITAL_COMMUNITY)
Admission: EM | Admit: 2017-09-16 | Discharge: 2017-09-22 | DRG: 871 | Disposition: A | Discharge status: Home Health | Payer: Medicare Other | Attending: Internal Medicine | Admitting: Internal Medicine

## 2017-09-16 ENCOUNTER — Encounter (HOSPITAL_COMMUNITY): Payer: Self-pay | Admitting: Pharmacy Technician

## 2017-09-16 ENCOUNTER — Other Ambulatory Visit: Payer: Self-pay

## 2017-09-16 DIAGNOSIS — Z91013 Allergy to seafood: Secondary | ICD-10-CM

## 2017-09-16 DIAGNOSIS — Z87891 Personal history of nicotine dependence: Secondary | ICD-10-CM

## 2017-09-16 DIAGNOSIS — A419 Sepsis, unspecified organism: Secondary | ICD-10-CM | POA: Diagnosis present

## 2017-09-16 DIAGNOSIS — I2510 Atherosclerotic heart disease of native coronary artery without angina pectoris: Secondary | ICD-10-CM | POA: Diagnosis present

## 2017-09-16 DIAGNOSIS — R4189 Other symptoms and signs involving cognitive functions and awareness: Secondary | ICD-10-CM | POA: Diagnosis present

## 2017-09-16 DIAGNOSIS — Z9089 Acquired absence of other organs: Secondary | ICD-10-CM | POA: Diagnosis not present

## 2017-09-16 DIAGNOSIS — J189 Pneumonia, unspecified organism: Secondary | ICD-10-CM | POA: Diagnosis present

## 2017-09-16 DIAGNOSIS — E785 Hyperlipidemia, unspecified: Secondary | ICD-10-CM | POA: Diagnosis present

## 2017-09-16 DIAGNOSIS — R945 Abnormal results of liver function studies: Secondary | ICD-10-CM

## 2017-09-16 DIAGNOSIS — K7460 Unspecified cirrhosis of liver: Secondary | ICD-10-CM | POA: Diagnosis present

## 2017-09-16 DIAGNOSIS — Z6822 Body mass index (BMI) 22.0-22.9, adult: Secondary | ICD-10-CM | POA: Diagnosis not present

## 2017-09-16 DIAGNOSIS — C220 Liver cell carcinoma: Secondary | ICD-10-CM | POA: Diagnosis present

## 2017-09-16 DIAGNOSIS — I10 Essential (primary) hypertension: Secondary | ICD-10-CM | POA: Diagnosis not present

## 2017-09-16 DIAGNOSIS — E43 Unspecified severe protein-calorie malnutrition: Secondary | ICD-10-CM | POA: Diagnosis present

## 2017-09-16 DIAGNOSIS — Z79899 Other long term (current) drug therapy: Secondary | ICD-10-CM

## 2017-09-16 DIAGNOSIS — R188 Other ascites: Secondary | ICD-10-CM | POA: Diagnosis present

## 2017-09-16 DIAGNOSIS — R7989 Other specified abnormal findings of blood chemistry: Secondary | ICD-10-CM | POA: Diagnosis present

## 2017-09-16 DIAGNOSIS — G929 Unspecified toxic encephalopathy: Secondary | ICD-10-CM | POA: Diagnosis present

## 2017-09-16 DIAGNOSIS — Z888 Allergy status to other drugs, medicaments and biological substances status: Secondary | ICD-10-CM

## 2017-09-16 DIAGNOSIS — L719 Rosacea, unspecified: Secondary | ICD-10-CM | POA: Diagnosis present

## 2017-09-16 DIAGNOSIS — R531 Weakness: Secondary | ICD-10-CM

## 2017-09-16 DIAGNOSIS — I340 Nonrheumatic mitral (valve) insufficiency: Secondary | ICD-10-CM | POA: Diagnosis not present

## 2017-09-16 DIAGNOSIS — K766 Portal hypertension: Secondary | ICD-10-CM | POA: Diagnosis present

## 2017-09-16 DIAGNOSIS — K5730 Diverticulosis of large intestine without perforation or abscess without bleeding: Secondary | ICD-10-CM | POA: Diagnosis present

## 2017-09-16 DIAGNOSIS — Z881 Allergy status to other antibiotic agents status: Secondary | ICD-10-CM

## 2017-09-16 DIAGNOSIS — R160 Hepatomegaly, not elsewhere classified: Secondary | ICD-10-CM | POA: Diagnosis not present

## 2017-09-16 DIAGNOSIS — Z7289 Other problems related to lifestyle: Secondary | ICD-10-CM

## 2017-09-16 DIAGNOSIS — R748 Abnormal levels of other serum enzymes: Secondary | ICD-10-CM | POA: Diagnosis present

## 2017-09-16 DIAGNOSIS — J90 Pleural effusion, not elsewhere classified: Secondary | ICD-10-CM

## 2017-09-16 DIAGNOSIS — I700 Atherosclerosis of aorta: Secondary | ICD-10-CM | POA: Diagnosis present

## 2017-09-16 DIAGNOSIS — G92 Toxic encephalopathy: Secondary | ICD-10-CM | POA: Diagnosis present

## 2017-09-16 DIAGNOSIS — I119 Hypertensive heart disease without heart failure: Secondary | ICD-10-CM | POA: Diagnosis present

## 2017-09-16 LAB — CBC WITH DIFFERENTIAL/PLATELET
Abs Immature Granulocytes: 0 10*3/uL (ref 0.0–0.1)
Basophils Absolute: 0 10*3/uL (ref 0.0–0.1)
Basophils Relative: 1 %
Eosinophils Absolute: 0 10*3/uL (ref 0.0–0.7)
Eosinophils Relative: 0 %
HEMATOCRIT: 40.4 % (ref 39.0–52.0)
HEMOGLOBIN: 13.5 g/dL (ref 13.0–17.0)
IMMATURE GRANULOCYTES: 0 %
LYMPHS ABS: 0.5 10*3/uL — AB (ref 0.7–4.0)
LYMPHS PCT: 7 %
MCH: 34.4 pg — ABNORMAL HIGH (ref 26.0–34.0)
MCHC: 33.4 g/dL (ref 30.0–36.0)
MCV: 103.1 fL — ABNORMAL HIGH (ref 78.0–100.0)
Monocytes Absolute: 0.8 10*3/uL (ref 0.1–1.0)
Monocytes Relative: 10 %
NEUTROS PCT: 82 %
Neutro Abs: 5.9 10*3/uL (ref 1.7–7.7)
Platelets: 157 10*3/uL (ref 150–400)
RBC: 3.92 MIL/uL — AB (ref 4.22–5.81)
RDW: 14.1 % (ref 11.5–15.5)
WBC: 7.3 10*3/uL (ref 4.0–10.5)

## 2017-09-16 LAB — CBC
HCT: 35.2 % — ABNORMAL LOW (ref 39.0–52.0)
Hemoglobin: 11.7 g/dL — ABNORMAL LOW (ref 13.0–17.0)
MCH: 34 pg (ref 26.0–34.0)
MCHC: 33.2 g/dL (ref 30.0–36.0)
MCV: 102.3 fL — ABNORMAL HIGH (ref 78.0–100.0)
PLATELETS: 141 10*3/uL — AB (ref 150–400)
RBC: 3.44 MIL/uL — ABNORMAL LOW (ref 4.22–5.81)
RDW: 14 % (ref 11.5–15.5)
WBC: 5.8 10*3/uL (ref 4.0–10.5)

## 2017-09-16 LAB — CREATININE, SERUM
CREATININE: 0.85 mg/dL (ref 0.61–1.24)
GFR calc Af Amer: >60 mL/min (ref >60)

## 2017-09-16 LAB — URINALYSIS, ROUTINE W REFLEX MICROSCOPIC
Bilirubin Urine: NEGATIVE
Glucose, UA: NEGATIVE mg/dL
Hgb urine dipstick: NEGATIVE
KETONES UR: NEGATIVE mg/dL
LEUKOCYTES UA: NEGATIVE
NITRITE: NEGATIVE
PH: 5 (ref 5.0–8.0)
Protein, ur: NEGATIVE mg/dL
SPECIFIC GRAVITY, URINE: 1.021 (ref 1.005–1.030)

## 2017-09-16 LAB — COMPREHENSIVE METABOLIC PANEL
ALT: 122 U/L — ABNORMAL HIGH (ref 0–44)
ANION GAP: 7 (ref 5–15)
AST: 106 U/L — ABNORMAL HIGH (ref 15–41)
Albumin: 2.4 g/dL — ABNORMAL LOW (ref 3.5–5.0)
Alkaline Phosphatase: 158 U/L — ABNORMAL HIGH (ref 38–126)
BUN: 11 mg/dL (ref 8–23)
CHLORIDE: 105 mmol/L (ref 98–111)
CO2: 27 mmol/L (ref 22–32)
Calcium: 8.7 mg/dL — ABNORMAL LOW (ref 8.9–10.3)
Creatinine, Ser: 0.84 mg/dL (ref 0.61–1.24)
Glucose, Bld: 113 mg/dL — ABNORMAL HIGH (ref 70–99)
Potassium: 4.1 mmol/L (ref 3.5–5.1)
Sodium: 139 mmol/L (ref 135–145)
Total Bilirubin: 2 mg/dL — ABNORMAL HIGH (ref 0.3–1.2)
Total Protein: 6 g/dL — ABNORMAL LOW (ref 6.5–8.1)

## 2017-09-16 LAB — AMMONIA: Ammonia: 20 umol/L (ref 9–35)

## 2017-09-16 LAB — CBG MONITORING, ED: GLUCOSE-CAPILLARY: 106 mg/dL — AB (ref 70–99)

## 2017-09-16 LAB — LIPASE, BLOOD: LIPASE: 68 U/L — AB (ref 11–51)

## 2017-09-16 LAB — BRAIN NATRIURETIC PEPTIDE: B Natriuretic Peptide: 713.9 pg/mL — ABNORMAL HIGH (ref 0.0–100.0)

## 2017-09-16 LAB — I-STAT CG4 LACTIC ACID, ED: LACTIC ACID, VENOUS: 1.84 mmol/L (ref 0.5–1.9)

## 2017-09-16 LAB — TSH: TSH: 1.178 u[IU]/mL (ref 0.350–4.500)

## 2017-09-16 MED ORDER — IBUPROFEN 800 MG PO TABS
800.0000 mg | ORAL_TABLET | Freq: Once | ORAL | Status: AC
  Administered 2017-09-16: 800 mg via ORAL
  Filled 2017-09-16: qty 1

## 2017-09-16 MED ORDER — IBUPROFEN 600 MG PO TABS: 600.0000 mg | ORAL_TABLET | Freq: Four times a day (QID) | ORAL | Status: DC | PRN

## 2017-09-16 MED ORDER — SODIUM CHLORIDE 0.9 % IV SOLN: 500.0000 mg | INTRAVENOUS | Status: DC

## 2017-09-16 MED ORDER — SODIUM CHLORIDE 0.9 % IV SOLN
2.0000 g | INTRAVENOUS | Status: DC
  Administered 2017-09-16: 2 g via INTRAVENOUS
  Filled 2017-09-16: qty 20

## 2017-09-16 MED ORDER — SODIUM CHLORIDE 0.9 % IV SOLN
2.0000 g | INTRAVENOUS | Status: DC
  Administered 2017-09-17 – 2017-09-19 (×3): 2 g via INTRAVENOUS
  Filled 2017-09-16 (×4): qty 20

## 2017-09-16 MED ORDER — SODIUM CHLORIDE 0.9 % IV SOLN
500.0000 mg | INTRAVENOUS | Status: DC
  Administered 2017-09-16 – 2017-09-18 (×3): 500 mg via INTRAVENOUS
  Filled 2017-09-16 (×4): qty 500

## 2017-09-16 MED ORDER — FUROSEMIDE 10 MG/ML IJ SOLN
40.0000 mg | Freq: Two times a day (BID) | INTRAMUSCULAR | Status: DC
  Administered 2017-09-16 – 2017-09-19 (×6): 40 mg via INTRAVENOUS
  Filled 2017-09-16 (×6): qty 4

## 2017-09-16 MED ORDER — ENOXAPARIN SODIUM 40 MG/0.4ML ~~LOC~~ SOLN
40.0000 mg | SUBCUTANEOUS | Status: DC
  Administered 2017-09-16 – 2017-09-21 (×6): 40 mg via SUBCUTANEOUS
  Filled 2017-09-16 (×6): qty 0.4

## 2017-09-16 NOTE — ED Provider Notes (Addendum)
[facility] EMERGENCY DEPARTMENT Provider Note   CSN: [id number] Arrival date & time: 09/16/17  1543     History   Chief Complaint Chief Complaint  Patient presents with  . Weakness    HPI Nahuel Wilbert is a 77 y.o. male.  HPI  Is a 77 year old male, he has a known history of hypertension and hyperlipidemia, he has chronic peripheral edema and in fact had been seen in the emergency department 6 days ago with some transient altered mental status after becoming overheated in a vehicle that he was sitting in.  Review of the medical record shows that at that visit he had a rather unremarkable work-up other than some elevated liver function testing however care everywhere laboratory follow-up showed that this was very close to baseline.  He presents today from the vein clinic where he had a DVT study performed which was negative, this was due to his bilateral lower extremity edema.  Of note the patient has a very low albumin at baseline which is just above 2-1/2.  When the paramedics were called to the facility because the patient had generalized weakness which seem to get worse while he was there.  His blood sugar was 160 and his vital signs were unremarkable except for a fever which they measured at 104.  The patient endorses feeling cold "all damn weak" however he denies any dysuria diarrhea or rashes coughing shortness of breath sore throat headache stiff neck earache or any other symptoms.  He denies any tick bites or sick exposures.  This weakness has been persistent in route to the hospital, nothing seems to make it better or worse, there is no collaborative information at this time as the family member is not here.  The patient states he does not know where he is but otherwise is alert and oriented to all other questions.  Past Medical History:  Diagnosis Date  . Acne rosacea   . Hyperlipidemia   . Hypertension   . Nocturia     Patient Active Problem List    Diagnosis Date Noted  . Nocturia     Past Surgical History:  Procedure Laterality Date  . TONSILLECTOMY          Home Medications    Prior to Admission medications   Medication Sig Start Date End Date Taking? Authorizing Provider  Ascorbic Acid (VITAMIN C) 100 MG tablet Take 300 mg by mouth daily.   Yes [provider]  furosemide (LASIX) 40 MG tablet Take 40 mg by mouth daily. 08/06/17  Yes [provider]  triamcinolone cream (KENALOG) 0.1 % Apply 1 application topically 2 (two) times daily.   Yes [provider]    Family History No family history on file.  Social History Social History   Tobacco Use  . Smoking status: Former Research scientist (life sciences)  . Smokeless tobacco: Never Used  Substance Use Topics  . Alcohol use: Yes  . Drug use: No     Allergies   Oysters [shellfish allergy]; Doxycycline; and Hydroxyzine hcl   Review of Systems Review of Systems  All other systems reviewed and are negative.    Physical Exam Updated Vital Signs BP (!) 118/51   Pulse 69   Temp (!) 102.4 F (39.1 C) (Rectal)   Resp 19   Ht 5\' 8"  (1.727 m)   Wt 68 kg   SpO2 96%   BMI 22.81 kg/m   Physical Exam  Constitutional: He appears well-developed and well-nourished. No distress.  Somnolent  but easily arousable  HENT:  Head: Normocephalic and atraumatic.  Mouth/Throat: No oropharyngeal exudate.  Mucous membranes are dry  Eyes: Pupils are equal, round, and reactive to light. Conjunctivae and EOM are normal. Right eye exhibits no discharge. Left eye exhibits no discharge. No scleral icterus.  Neck: Normal range of motion. Neck supple. No JVD present. No thyromegaly present.  Very supple neck, no lymphadenopathy  Cardiovascular: Normal rate, regular rhythm, normal heart sounds and intact distal pulses. Exam reveals no gallop and no friction rub.  No murmur heard. No tachycardia, normal pulses, no JVD  Pulmonary/Chest: Effort normal. No respiratory distress. He  has no wheezes. He has rales ( Rales at the bases that clear with deep breathing, no increased work of breathing).  Abdominal: Soft. Bowel sounds are normal. He exhibits no distension and no mass. There is tenderness.  Mild diffuse abdominal tenderness but soft without guarding.  No dullness to percussion, no fluid wave  Musculoskeletal: Normal range of motion. He exhibits edema ( He has pitting edema extending up to his proximal thighs, it does not extend onto the abdominal wall.  Symmetrical). He exhibits no tenderness.  Lymphadenopathy:    He has no cervical adenopathy.  Neurological: He is alert. Coordination normal.  Generally weak, the patient is unable to get from the stretcher onto the gurney, that being said he is able to move all 4 extremities with equal strength, just generally weak.  Cranial nerves III through XII appear to be intact, his speech is clear, he appears mildly somnolent  Skin: Skin is warm and dry. No rash noted. No erythema.  Psychiatric: He has a normal mood and affect. His behavior is normal.  Nursing note and vitals reviewed.    ED Treatments / Results  Labs (all labs ordered are listed, but only abnormal results are displayed) Labs Reviewed  COMPREHENSIVE METABOLIC PANEL - Abnormal; Notable for the following components:      Result Value   Glucose, Bld 113 (*)    Calcium 8.7 (*)    Total Protein 6.0 (*)    Albumin 2.4 (*)    AST 106 (*)    ALT 122 (*)    Alkaline Phosphatase 158 (*)    Total Bilirubin 2.0 (*)    All other components within normal limits  CBC WITH DIFFERENTIAL/PLATELET - Abnormal; Notable for the following components:   RBC 3.92 (*)    MCV 103.1 (*)    MCH 34.4 (*)    Lymphs Abs 0.5 (*)    All other components within normal limits  URINALYSIS, ROUTINE W REFLEX MICROSCOPIC - Abnormal; Notable for the following components:   Color, Urine AMBER (*)    All other components within normal limits  BRAIN NATRIURETIC PEPTIDE - Abnormal;  Notable for the following components:   B Natriuretic Peptide 713.9 (*)    All other components within normal limits  LIPASE, BLOOD - Abnormal; Notable for the following components:   Lipase 68 (*)    All other components within normal limits  CBG MONITORING, ED - Abnormal; Notable for the following components:   Glucose-Capillary 106 (*)    All other components within normal limits  CULTURE, BLOOD (ROUTINE X 2)  CULTURE, BLOOD (ROUTINE X 2)  AMMONIA  I-STAT CG4 LACTIC ACID, ED  I-STAT CG4 LACTIC ACID, ED    EKG None  Radiology Dg Chest Port 1 View  Result Date: 09/16/2017 CLINICAL DATA:  Generalized weakness. EXAM: PORTABLE CHEST 1 VIEW COMPARISON:  07/06/2009 FINDINGS:  Artifact overlies the chest. The heart is mildly enlarged. There is aortic atherosclerosis. There is abnormal density in the mid and lower lungs bilaterally. I think this could either be due to congestive heart failure with early pulmonary edema or bilateral pneumonia left worse than right. No significant bone finding. IMPRESSION: Abnormal radiography with interstitial and alveolar density that could be either congestive heart failure/pulmonary edema or infectious pneumonia. Electronically Signed   By: Nelson Chimes M.D.   On: 09/16/2017 16:50    Procedures .Critical Care Performed by: Noemi Chapel, MD Authorized by: Noemi Chapel, MD   Critical care provider statement:    Critical care time (minutes):  35   Critical care time was exclusive of:  Separately billable procedures and treating other patients and teaching time   Critical care was necessary to treat or prevent imminent or life-threatening deterioration of the following conditions:  Sepsis   Critical care was time spent personally by me on the following activities:  Blood draw for specimens, development of treatment plan with patient or surrogate, discussions with consultants, evaluation of patient's response to treatment, examination of patient, obtaining  history from patient or surrogate, ordering and performing treatments and interventions, ordering and review of laboratory studies, ordering and review of radiographic studies, pulse oximetry, re-evaluation of patient's condition and review of old charts   (including critical care time)  Medications Ordered in ED Medications  cefTRIAXone (ROCEPHIN) 2 g in sodium chloride 0.9 % 100 mL IVPB (has no administration in time range)  azithromycin (ZITHROMAX) 500 mg in sodium chloride 0.9 % 250 mL IVPB (has no administration in time range)  ibuprofen (ADVIL,MOTRIN) tablet 800 mg (800 mg Oral Given 09/16/17 1631)     Initial Impression / Assessment and Plan / ED Course  I have reviewed the triage vital signs and the nursing notes.  Pertinent labs & imaging results that were available during my care of the patient were reviewed by me and considered in my medical decision making (see chart for details).  Clinical Course as of Sep 16 1740  Mon Sep 16, 2017  1556 On repeat exam, the patient has been verified to have a fever of 102.4, he does not have a tachycardia his pulse is only at 80, he is not hypotensive however he is still somnolent but arousable.  He is able to tell me that he is been seen at [facility], he does not have a history of hepatitis, cirrhosis or significant alcohol use since his 2s or 30s.  Reinspection of his entire skin exam shows no rash, no hernias in the groin.  No obvious source of fever on exam.  We will continue with urinalysis by in and out catheterization, chest x-ray and labs.   [BM]    Clinical Course User Index [BM] Noemi Chapel, MD   The patient will need formal vital signs, EKG, work-up including a chest x-ray to urinalysis.  He recently had a CT scan of the brain less than 1 week ago due to altered mental status which did not show any acute findings.  He does not appear formally jaundiced but if he has trending upwards of his liver function testing he may  need further evaluation of his abdomen.  Rule out pneumonia, urinary infection or other source of fever.  The patient has a pneumonia on his x-ray, I have personally viewed the images and there does appear to be some infiltrates.  Thankfully the rest of the lab work is unremarkable but he  continues to be altered making this a clinical description of sepsis.  He has been given antibiotics to treat for pneumonia, his lactic acid and blood pressure are reassuring and he does not need a fluid bolus at this time.  I discussed his care with the hospitalist, Dr. Wyline Copas who will admit.  Family was updated and states that he has been off of all alcohol for the last 6 weeks though he does have a history of chronic alcohol intake.  Final Clinical Impressions(s) / ED Diagnoses   Final diagnoses:  Sepsis, due to unspecified organism [facility])      Noemi Chapel, MD 09/16/17 1742    Noemi Chapel, MD 09/28/17 [phone]

## 2017-09-16 NOTE — ED Notes (Signed)
X-ray at bedside

## 2017-09-16 NOTE — ED Notes (Signed)
Patient transported to Ultrasound 

## 2017-09-16 NOTE — ED Triage Notes (Signed)
Pt arrives via EMS with reports of generalized weakness from vein specialist. Per EMS DVT study negative. EMS was called due to fatigue and weakness. Pt a&oX3. 104F, 70, 145/59. 98% RA, cbg 160.

## 2017-09-16 NOTE — H&P (Signed)
History and Physical    Linton Stolp LEX:[id number] DOB: 1940-06-14 DOA: 09/16/2017  PCP: System, Pcp Not In  Patient coming from: Home  Chief Complaint: Weakness  HPI: Devin Reeves is a 77 y.o. male with medical history significant of HTN, HLD who presents to the ED with increased weakness and fevers. Patient was seen in the ED 6 days prior to this admission for complaints of confusion and weakness. Work up in ED was unremarkable with exception of mildly elevated LFT's and patient was discharged home. Confusion and weakness worsened gradually. Patient also noted to have worsening bilateral LE edema over the past several weeks prior to ED visit. Patient was seen by PCP who recommended "fluid pill" with little resultant urine output. Patient was then instructed to increase fluid intake in addition to "fluid pill." Patient later noted to have worsening bilateral edema and was referred to a "vein specialist" by patient's wife. Per wife, vascular work up was found to be unremarkable. While at vein specialist, patient became acutely more confused and markedly weak. EMS was called and pt was found to be febrile in the field. Patient brought to ED for further work up.  On further questioning, patient's wife reports gradually worsening confusion and forgetfulness over the past several months at least, associated with shuffling gait.  ED Course: In the ED, pt was noted to be febrile rectally with normal WBC. LFT's were noted to be higher with AST and ALT into the 100's with alk phos up to 158 and bili of 2.0. BNP of 713.9. When seen, patient denied chest pain, orthopnea  Review of Systems:  Review of Systems  Constitutional: Positive for chills, fever and malaise/fatigue.  HENT: Negative for congestion, ear discharge, ear pain and nosebleeds.   Eyes: Negative for double vision, photophobia and pain.  Respiratory: Negative for hemoptysis, sputum production and shortness of breath.   Cardiovascular:  Positive for leg swelling. Negative for chest pain, palpitations, orthopnea and PND.  Gastrointestinal: Negative for abdominal pain, nausea and vomiting.  Genitourinary: Negative for frequency, hematuria and urgency.  Musculoskeletal: Negative for back pain, joint pain and neck pain.  Neurological: Positive for weakness. Negative for tingling, tremors, seizures, loss of consciousness and headaches.  Psychiatric/Behavioral: Negative for substance abuse. The patient is not nervous/anxious and does not have insomnia.     Past Medical History:  Diagnosis Date  . Acne rosacea   . Hyperlipidemia   . Hypertension   . Nocturia     Past Surgical History:  Procedure Laterality Date  . TONSILLECTOMY       reports that he has quit smoking. He has never used smokeless tobacco. He reports that he drinks alcohol. He reports that he does not use drugs.  Allergies  Allergen Reactions  . Oysters [Shellfish Allergy]   . Doxycycline Rash  . Hydroxyzine Hcl Rash    Family hx Grandfather with "bone cancer" States family otherwise "healthy" with no other medical issues known  Prior to Admission medications   Medication Sig Start Date End Date Taking? Authorizing Provider  Ascorbic Acid (VITAMIN C) 100 MG tablet Take 300 mg by mouth daily.   Yes [provider]  furosemide (LASIX) 40 MG tablet Take 40 mg by mouth daily. 08/06/17  Yes [provider]  triamcinolone cream (KENALOG) 0.1 % Apply 1 application topically 2 (two) times daily.   Yes [provider]    Physical Exam: Vitals:   09/16/17 1615 09/16/17 1700 09/16/17 1715 09/16/17 1730  BP: (!) 149/62 Marland Kitchen)  137/54 (!) 124/49 (!) 118/51  Pulse: 76 73 72 69  Resp: _0 Temp:      TempSrc:      SpO2: 96% 97% 96% 96%  Weight:      Height:        Constitutional: NAD, calm, comfortable Vitals:   09/16/17 1615 09/16/17 1700 09/16/17 1715 09/16/17 1730  BP: (!) 149/62 (!) 137/54 (!) 124/49 (!) 118/51    Pulse: 76 73 72 69  Resp: _1 Temp:      TempSrc:      SpO2: 96% 97% 96% 96%  Weight:      Height:       Eyes: PERRL, lids and conjunctivae normal ENMT: Mucous membranes are moist. Posterior pharynx clear of any exudate or lesions.Normal dentition.  Neck: normal, supple, no masses, no thyromegaly Respiratory: clear to auscultation bilaterally, no wheezing, no crackles. Normal respiratory effort. No accessory muscle use.  Cardiovascular: Regular rate and rhythm, B LE edema Abdomen: no tenderness, no masses palpated. No hepatosplenomegaly. Bowel sounds positive.  Musculoskeletal: no clubbing / cyanosis. No joint deformity upper and lower extremities. Good ROM, no contractures. Normal muscle tone.  Skin: generalized rash across face, arms, LE, No induration Neurologic: CN 2-12 grossly intact. Sensation intact, DTR normal. Strength 5/5 in all 4.  Psychiatric: Normal judgment and insight. Alert and oriented x 3. Normal mood.    Labs on Admission: I have personally reviewed following labs and imaging studies  CBC: Recent Labs  Lab 09/10/17 1832 09/16/17 1558  WBC 7.3 7.3  NEUTROABS 5.9 5.9  HGB 13.6 13.5  HCT 40.5 40.4  MCV 100.7* 103.1*  PLT 145* 132   Basic Metabolic Panel: Recent Labs  Lab 09/10/17 1832 09/16/17 1558  NA 139 139  K 4.2 4.1  CL 102 105  CO2 29 27  GLUCOSE 90 113*  BUN 17 11  CREATININE 0.90 0.84  CALCIUM 9.2 8.7*   GFR: Estimated Creatinine Clearance: 70.8 mL/min (by C-G formula based on SCr of 0.84 mg/dL). Liver Function Tests: Recent Labs  Lab 09/10/17 1832 09/16/17 1558  AST 108* 106*  ALT 99* 122*  ALKPHOS 131* 158*  BILITOT 2.6* 2.0*  PROT 6.5 6.0*  ALBUMIN 2.8* 2.4*   Recent Labs  Lab 09/16/17 1558  LIPASE 68*   Recent Labs  Lab 09/16/17 1558  AMMONIA 20   Coagulation Profile: Recent Labs  Lab 09/10/17 1832  INR 1.21   Cardiac Enzymes: No results for input(s): CKTOTAL, CKMB, CKMBINDEX, TROPONINI in the last  168 hours. BNP (last 3 results) No results for input(s): PROBNP in the last 8760 hours. HbA1C: No results for input(s): HGBA1C in the last 72 hours. CBG: Recent Labs  Lab 09/10/17 1750 09/16/17 1553  GLUCAP 76 106*   Lipid Profile: No results for input(s): CHOL, HDL, LDLCALC, TRIG, CHOLHDL, LDLDIRECT in the last 72 hours. Thyroid Function Tests: No results for input(s): TSH, T4TOTAL, FREET4, T3FREE, THYROIDAB in the last 72 hours. Anemia Panel: No results for input(s): VITAMINB12, FOLATE, FERRITIN, TIBC, IRON, RETICCTPCT in the last 72 hours. Urine analysis:    Component Value Date/Time   COLORURINE AMBER (A) 09/16/2017 [place] 09/16/2017 1645   LABSPEC 1.021 09/16/2017 1645   PHURINE 5.0 09/16/2017 1645   GLUCOSEU NEGATIVE 09/16/2017 1645   HGBUR NEGATIVE 09/16/2017 [place] NEGATIVE 09/16/2017 1645   KETONESUR NEGATIVE 09/16/2017 1645   PROTEINUR NEGATIVE 09/16/2017 1645   NITRITE NEGATIVE 09/16/2017  [place] 09/16/2017 1645   Sepsis Labs: !!!!!!!!!!!!!!!!!!!!!!!!!!!!!!!!!!!!!!!!!!!! _0 (procalcitonin:4,lacticidven:4) )No results found for this or any previous visit (from the past 240 hour(s)).   Radiological Exams on Admission: xray personally reviewed Dg Chest Port 1 View  Result Date: 09/16/2017 CLINICAL DATA:  Generalized weakness. EXAM: PORTABLE CHEST 1 VIEW COMPARISON:  07/06/2009 FINDINGS: Artifact overlies the chest. The heart is mildly enlarged. There is aortic atherosclerosis. There is abnormal density in the mid and lower lungs bilaterally. I think this could either be due to congestive heart failure with early pulmonary edema or bilateral pneumonia left worse than right. No significant bone finding. IMPRESSION: Abnormal radiography with interstitial and alveolar density that could be either congestive heart failure/pulmonary edema or infectious pneumonia. Electronically Signed   By: Nelson Chimes M.D.   On:  09/16/2017 16:50    EKG: Independently reviewed. NSR  Assessment/Plan Principal Problem:   Sepsis due to pneumonia [facility]) Active Problems:   Elevated liver function tests   Weakness   Toxic encephalopathy   1. Sepsis with Pneumonia, present on admission 1. Presents with fevers, acutely worsened confusion with CXR findings of possible underlying PNA 2. Blood cx pending 3. UA reviewed, unremarkable 4. Will continue patient on empiric azithromycin and rocephin 5. Lactate <2 at time of presentation 6. Given concerns of volume overload, will hold off on IVF hydration 2. LE edema 1. Elevated BNP on admission 2. BLE pitting edema noted, suspicion for new CHF 3. 2d echo ordered, pending 4. Will check TSH 3. Elevated LFT 1. Uncertain etiology 2. Will check acute hepatitis panel 3. Given concerns of volume overload, can consider hepatic congestion from CHF 4. Will check RUQ [place] 4. Weakness 1. Suspect related to sepsis vs possible acute CHF 5. Toxic metabolic encephalopathy 1. Likely secondary to presenting sepsis 6. Possible underlying dementia 1. Family reports gradually worsening confusion and forgetfulness over the past several months 2. Continue to monitor for now 7. Acne rosacea 1. Patient with generalized rash, noted to be chronic per family with known rosacea. Stable  DVT prophylaxis: Lovenox subq  Code Status: Full Family Communication: Pt in room, family at bedside  Disposition Plan: Uncertain at this time  Consults called:  Admission status: INpatient as would likely require greater than 2 midnight stay to address sepsis and volume overload, CHF work up   Marylu Lund MD [facility] Pager [phone]  If 7PM-7AM, please contact night-coverage [email or web address] Password [email or web address]  09/16/2017, 6:01 PM

## 2017-09-16 NOTE — Progress Notes (Signed)
Report obtained from ED RN. Room ready for patient. Deveon Kisiel Joselita, RN 

## 2017-09-16 NOTE — ED Notes (Signed)
Pt returned from US

## 2017-09-17 ENCOUNTER — Inpatient Hospital Stay (HOSPITAL_COMMUNITY): Payer: Medicare Other

## 2017-09-17 ENCOUNTER — Other Ambulatory Visit: Payer: Self-pay

## 2017-09-17 ENCOUNTER — Encounter (HOSPITAL_COMMUNITY): Payer: Self-pay | Admitting: *Deleted

## 2017-09-17 DIAGNOSIS — R160 Hepatomegaly, not elsewhere classified: Secondary | ICD-10-CM | POA: Diagnosis present

## 2017-09-17 DIAGNOSIS — G92 Toxic encephalopathy: Secondary | ICD-10-CM

## 2017-09-17 DIAGNOSIS — R531 Weakness: Secondary | ICD-10-CM

## 2017-09-17 DIAGNOSIS — I340 Nonrheumatic mitral (valve) insufficiency: Secondary | ICD-10-CM

## 2017-09-17 LAB — COMPREHENSIVE METABOLIC PANEL
ALK PHOS: 140 U/L — AB (ref 38–126)
ALT: 104 U/L — AB (ref 0–44)
ANION GAP: 4 — AB (ref 5–15)
AST: 91 U/L — ABNORMAL HIGH (ref 15–41)
Albumin: 2.2 g/dL — ABNORMAL LOW (ref 3.5–5.0)
BUN: 12 mg/dL (ref 8–23)
CO2: 28 mmol/L (ref 22–32)
Calcium: 8.1 mg/dL — ABNORMAL LOW (ref 8.9–10.3)
Chloride: 105 mmol/L (ref 98–111)
Creatinine, Ser: 0.92 mg/dL (ref 0.61–1.24)
GFR calc non Af Amer: >60 mL/min (ref >60)
GLUCOSE: 108 mg/dL — AB (ref 70–99)
Potassium: 3.7 mmol/L (ref 3.5–5.1)
SODIUM: 137 mmol/L (ref 135–145)
Total Bilirubin: 1.9 mg/dL — ABNORMAL HIGH (ref 0.3–1.2)
Total Protein: 5.5 g/dL — ABNORMAL LOW (ref 6.5–8.1)

## 2017-09-17 LAB — CBC
HEMATOCRIT: 39.7 % (ref 39.0–52.0)
Hemoglobin: 13.5 g/dL (ref 13.0–17.0)
MCH: 34.6 pg — ABNORMAL HIGH (ref 26.0–34.0)
MCHC: 34 g/dL (ref 30.0–36.0)
MCV: 101.8 fL — AB (ref 78.0–100.0)
Platelets: 151 10*3/uL (ref 150–400)
RBC: 3.9 MIL/uL — ABNORMAL LOW (ref 4.22–5.81)
RDW: 14.1 % (ref 11.5–15.5)
WBC: 5.6 10*3/uL (ref 4.0–10.5)

## 2017-09-17 LAB — PROTIME-INR
INR: 1.32
Prothrombin Time: 16.3 seconds — ABNORMAL HIGH (ref 11.4–15.2)

## 2017-09-17 LAB — ECHOCARDIOGRAM COMPLETE
Height: 68 in
WEIGHTICAEL: 2400 [oz_av]

## 2017-09-17 LAB — HIV ANTIBODY (ROUTINE TESTING W REFLEX): HIV SCREEN 4TH GENERATION: NONREACTIVE

## 2017-09-17 LAB — PROCALCITONIN: Procalcitonin: 0.52 ng/mL

## 2017-09-17 MED ORDER — IOPAMIDOL (ISOVUE-300) INJECTION 61%: 100.0000 mL | Freq: Once | INTRAVENOUS | Status: DC | PRN

## 2017-09-17 MED ORDER — IOPAMIDOL (ISOVUE-300) INJECTION 61%: 30.0000 mL | Freq: Once | INTRAVENOUS | Status: DC | PRN

## 2017-09-17 MED ORDER — IOPAMIDOL (ISOVUE-300) INJECTION 61%
INTRAVENOUS | Status: AC
  Filled 2017-09-17: qty 100

## 2017-09-17 MED ORDER — IOPAMIDOL (ISOVUE-300) INJECTION 61%
INTRAVENOUS | Status: AC
  Filled 2017-09-17: qty 30

## 2017-09-17 MED ORDER — IOPAMIDOL (ISOVUE-300) INJECTION 61%
100.0000 mL | Freq: Once | INTRAVENOUS | Status: AC | PRN
  Administered 2017-09-17: 100 mL via INTRAVENOUS

## 2017-09-17 NOTE — Progress Notes (Signed)
09/17/2017 4:15 PM  CT results reviewed with patient.  Will ask IR to consider US thoracentesis and send fluid for cytology testing.     Pt and wife updated and agree to proceed.    Murvin Natal MD

## 2017-09-17 NOTE — Progress Notes (Signed)
PROGRESS NOTE  Devin Reeves  JSH:[id number]  DOB: 1940/08/22  DOA: 09/16/2017 PCP: System, Pcp Not In   Brief Admission Hx: Devin Reeves is a 76 y.o. male with medical history significant of HTN, HLD who presents to the ED with increased weakness and fevers.   MDM/Assessment & Plan:   1. Hepatic masses - these lesions are very concerning for malignancy.  He will need a biopsy done at some point for definitive diagnosis.  Obtain CT chest abdomen pelvis to look for a primary source.  Given his liver cirrhosis this could be a primary liver cancer.  I spoke with patient and wife and they agree to proceed with CT scan studies.   2. Sepsis with pneumonia - resolved now.  He is clinically much improved, continue antibiotics and supportive care.  3. LE edema - improving with IV lasix.  4. Liver cirrhosis - pt has a heavy alcohol history when he was younger, he reports only drinking wine occasionally now.  CT pending.  5. Elevated liver enzymes - likely secondary to chronic liver disease and liver masses.  Following.  6. Generalized weakness - multifactorial.  Treating acute issues now.  PT eval pending.   7. Presumed dementia - Family reports progressive symptoms of memory loss for past several months.  He needs outpatient work up for this.  8. Acne rosacea - stable.    DVT prophylaxis: lovenox Code Status: Full  Family Communication: wife telephone Disposition Plan: home when medically stabilized   Antimicrobials:  Ceftriaxone 8/19  Azithromycin 8/19  Subjective: Pt says that he is feeling a lot better this morning.  He is oriented and cooperative.   Objective: Vitals:   09/16/17 2215 09/16/17 2246 09/17/17 0518 09/17/17 0745  BP: (!) 119/51 (!) 129/55 (!) 124/56 (!) 139/57  Pulse: 62 63 68 (!) 59  Resp: 18 18 18 18   Temp:  97.9 F (36.6 C) 98.2 F (36.8 C) 98.3 F (36.8 C)  TempSrc:  Oral Oral Oral  SpO2: 95% 96% 98% 97%  Weight:      Height:        Intake/Output  Summary (Last 24 hours) at 09/17/2017 0920 Last data filed at 09/17/2017 8502 Gross per 24 hour  Intake 750.22 ml  Output 1325 ml  Net -574.78 ml   Filed Weights   09/16/17 1550  Weight: 68 kg   REVIEW OF SYSTEMS  As per history otherwise all reviewed and reported negative  Exam:  General exam: elderly male, thin, emaciated, NAD, cooperative.  Respiratory system: BBS with some basilar crackles.  No increased work of breathing. Cardiovascular system: normal S1 & S2 heard. No JVD.  Gastrointestinal system: Abdomen is nondistended, soft and RUQ and RLQ tenderness. Hepatomegaly. Normal bowel sounds heard. Central nervous system: Alert and oriented. No focal neurological deficits. Extremities: no CCE.  Data Reviewed: Basic Metabolic Panel: Recent Labs  Lab 09/10/17 1832 09/16/17 1558 09/16/17 2003 09/17/17 0503  NA 139 139  --  137  K 4.2 4.1  --  3.7  CL 102 105  --  105  CO2 29 27  --  28  GLUCOSE 90 113*  --  108*  BUN 17 11  --  12  CREATININE 0.90 0.84 0.85 0.92  CALCIUM 9.2 8.7*  --  8.1*   Liver Function Tests: Recent Labs  Lab 09/10/17 1832 09/16/17 1558 09/17/17 0503  AST 108* 106* 91*  ALT 99* 122* 104*  ALKPHOS 131* 158* 140*  BILITOT 2.6* 2.0* 1.9*  PROT 6.5 6.0* 5.5*  ALBUMIN 2.8* 2.4* 2.2*   Recent Labs  Lab 09/16/17 1558  LIPASE 68*   Recent Labs  Lab 09/16/17 1558  AMMONIA 20   CBC: Recent Labs  Lab 09/10/17 1832 09/16/17 1558 09/16/17 2003 09/17/17 0503  WBC 7.3 7.3 5.8 5.6  NEUTROABS 5.9 5.9  --   --   HGB 13.6 13.5 11.7* 13.5  HCT 40.5 40.4 35.2* 39.7  MCV 100.7* 103.1* 102.3* 101.8*  PLT 145* 157 141* 151   Cardiac Enzymes: No results for input(s): CKTOTAL, CKMB, CKMBINDEX, TROPONINI in the last 168 hours. CBG (last 3)  Recent Labs    09/16/17 1553  GLUCAP 106*   No results found for this or any previous visit (from the past 240 hour(s)).   Studies: Dg Chest Port 1 View  Result Date: 09/16/2017 CLINICAL DATA:   Generalized weakness. EXAM: PORTABLE CHEST 1 VIEW COMPARISON:  07/06/2009 FINDINGS: Artifact overlies the chest. The heart is mildly enlarged. There is aortic atherosclerosis. There is abnormal density in the mid and lower lungs bilaterally. I think this could either be due to congestive heart failure with early pulmonary edema or bilateral pneumonia left worse than right. No significant bone finding. IMPRESSION: Abnormal radiography with interstitial and alveolar density that could be either congestive heart failure/pulmonary edema or infectious pneumonia. Electronically Signed   By: Nelson Chimes M.D.   On: 09/16/2017 16:50   US Abdomen Limited Ruq  Result Date: 09/16/2017 CLINICAL DATA:  Elevated liver enzymes EXAM: ULTRASOUND ABDOMEN LIMITED RIGHT UPPER QUADRANT COMPARISON:  None. FINDINGS: Gallbladder: Stones and sludge are present within the gallbladder, largest measured stone at 8 mm. There is no gallbladder wall thickening. No sonographic Murphy's sign elicited. Common bile duct: Diameter: 4 mm. Liver: Diffusely heterogeneous parenchyma suggesting cirrhosis and/or fatty infiltration. Multiple masses are identified within the liver, the 2 largest are both located within the RIGHT liver lobe measuring 8 cm and 7.3 cm greatest dimension respectively. Portal vein is patent on color Doppler imaging with normal direction of blood flow towards the liver. Small amount of free fluid is seen within the RIGHT upper quadrant. IMPRESSION: 1. At least 2 large hepatic masses, both located within the RIGHT liver lobe, measuring 8 cm and 7.3 cm respectively. These are highly suspicious for neoplastic masses. If/when patient is clinically stable and able to follow directions and hold their breath (preferably as an outpatient) further evaluation with dedicated liver MRI should be considered. 2. Suspect underlying liver cirrhosis. 3. Small amount of ascites within the RIGHT upper quadrant. 4. Cholelithiasis without  convincing evidence of acute cholecystitis. No bile duct dilatation. Electronically Signed   By: Franki Cabot M.D.   On: 09/16/2017 19:26   Scheduled Meds: . enoxaparin (LOVENOX) injection  40 mg Subcutaneous Q24H  . furosemide  40 mg Intravenous Q12H   Continuous Infusions: . azithromycin Stopped (09/16/17 2203)  . cefTRIAXone (ROCEPHIN)  IV      Principal Problem:   Sepsis due to pneumonia [facility]) Active Problems:   Elevated liver function tests   Weakness   Toxic encephalopathy  Time spent:   Irwin Brakeman, MD, FAAFP [facility] Pager [phone]  If 7PM-7AM, please contact night-coverage [email or web address] Password [email or web address] 09/17/2017, 9:20 AM    LOS: 1 day

## 2017-09-17 NOTE — Progress Notes (Signed)
  Echocardiogram 2D Echocardiogram has been performed.  Gillian Kluever L Androw 09/17/2017, 3:15 PM

## 2017-09-18 ENCOUNTER — Inpatient Hospital Stay (HOSPITAL_COMMUNITY): Payer: Medicare Other

## 2017-09-18 DIAGNOSIS — R945 Abnormal results of liver function studies: Secondary | ICD-10-CM

## 2017-09-18 DIAGNOSIS — R748 Abnormal levels of other serum enzymes: Secondary | ICD-10-CM

## 2017-09-18 LAB — AMYLASE, PLEURAL OR PERITONEAL FLUID: AMYLASE FL: 21 U/L

## 2017-09-18 LAB — BODY FLUID CELL COUNT WITH DIFFERENTIAL
Eos, Fluid: 0 %
Lymphs, Fluid: 44 %
Monocyte-Macrophage-Serous Fluid: 37 % — ABNORMAL LOW (ref 50–90)
NEUTROPHIL FLUID: 19 % (ref 0–25)
Total Nucleated Cell Count, Fluid: 47 cu mm (ref 0–1000)

## 2017-09-18 LAB — BASIC METABOLIC PANEL
Anion gap: 5 (ref 5–15)
BUN: 11 mg/dL (ref 8–23)
CHLORIDE: 103 mmol/L (ref 98–111)
CO2: 27 mmol/L (ref 22–32)
Calcium: 7.9 mg/dL — ABNORMAL LOW (ref 8.9–10.3)
Creatinine, Ser: 0.82 mg/dL (ref 0.61–1.24)
Glucose, Bld: 85 mg/dL (ref 70–99)
POTASSIUM: 3.5 mmol/L (ref 3.5–5.1)
SODIUM: 135 mmol/L (ref 135–145)

## 2017-09-18 LAB — HEPATITIS PANEL, ACUTE
HCV Ab: <0.1 s/co ratio (ref 0.0–0.9)
HEP B C IGM: NEGATIVE
Hep A IgM: NEGATIVE
Hepatitis B Surface Ag: NEGATIVE

## 2017-09-18 LAB — TSH: TSH: 2.61 u[IU]/mL (ref 0.350–4.500)

## 2017-09-18 LAB — CBC
HEMATOCRIT: 38 % — AB (ref 39.0–52.0)
Hemoglobin: 13 g/dL (ref 13.0–17.0)
MCH: 34 pg (ref 26.0–34.0)
MCHC: 34.2 g/dL (ref 30.0–36.0)
MCV: 99.5 fL (ref 78.0–100.0)
PLATELETS: 155 10*3/uL (ref 150–400)
RBC: 3.82 MIL/uL — AB (ref 4.22–5.81)
RDW: 13.6 % (ref 11.5–15.5)
WBC: 6 10*3/uL (ref 4.0–10.5)

## 2017-09-18 LAB — MAGNESIUM: Magnesium: 1.9 mg/dL (ref 1.7–2.4)

## 2017-09-18 LAB — GRAM STAIN

## 2017-09-18 LAB — PROTEIN, PLEURAL OR PERITONEAL FLUID

## 2017-09-18 LAB — GLUCOSE, PLEURAL OR PERITONEAL FLUID: Glucose, Fluid: 118 mg/dL

## 2017-09-18 LAB — ALBUMIN, PLEURAL OR PERITONEAL FLUID: Albumin, Fluid: <1 g/dL

## 2017-09-18 LAB — VITAMIN B12: VITAMIN B 12: 1155 pg/mL — AB (ref 180–914)

## 2017-09-18 MED ORDER — POTASSIUM CHLORIDE CRYS ER 20 MEQ PO TBCR
40.0000 meq | EXTENDED_RELEASE_TABLET | Freq: Two times a day (BID) | ORAL | Status: DC
  Administered 2017-09-18 – 2017-09-21 (×7): 40 meq via ORAL
  Filled 2017-09-18 (×7): qty 2

## 2017-09-18 MED ORDER — LIDOCAINE HCL (PF) 1 % IJ SOLN
INTRAMUSCULAR | Status: AC
  Filled 2017-09-18: qty 30

## 2017-09-18 NOTE — Procedures (Signed)
PROCEDURE SUMMARY:  Successful image-guided right thoracentesis. Yielded 680 milliliters of clear yellow fluid. Patient tolerated procedure well. No immediate complications.  Specimen was sent for labs. CXR ordered.  Joaquim Nam PA-C 09/18/2017 10:36 AM

## 2017-09-18 NOTE — Progress Notes (Addendum)
PROGRESS NOTE  Edyn Qazi  UXN:[id number]  DOB: May 02, 1940  DOA: 09/16/2017 PCP: System, Pcp Not In   Brief Admission Hx: Rachel Samples is a 77 y.o. male with medical history significant of HTN, HLD who presents to the ED with increased weakness and fevers.   MDM/Assessment & Plan:   1. Large right liver mass    -Highly concerning for HCC   -CT chest abdomen pelvis noted, nodular opacity noted in the lung which is concerning for distant metastasis - Underwent paracentesis today, await cytology -If this is negative will order a CT-guided liver biopsy, also check alpha-fetoprotein  2. Fever/pleural effusions -could be tumor fever, for now continue empiric antibiotics for pneumonia. After 5 day course -CT chest without convincing evidence for pneumonia  3. Pleural effusions/edema - likely secondary to liver cirrhosis, worsened by hypoalbuminemia from ? Malignancy -continue IV Lasix today -2-D echocardiogram with EF of 50-55%, unable to assess diastolic parameters  4. Liver cirrhosis - pt has a heavy alcohol history when he was younger, he reports only drinking wine occasionally now.        -Will need diuretics, check hepatitis C serology  5. Generalized weakness - multifactorial.  Treating acute issues now.  PT eval pending.    6. Cognitive decline - Family reports progressive symptoms of memory loss for past several months.  He needs outpatient work up for this.  - Check B 12, TSH  7. Acne rosacea - stable.    DVT prophylaxis: lovenox Code Status: Full  Family Communication: wife  At bedside Disposition Plan: home when medically stabilized  Procedures: Right thoracentesis 8/21 with 680 mL of clear fluid drained  Antimicrobials:  Ceftriaxone 8/19  Azithromycin 8/19  Subjective: -her breathing better, underwent thoracentesis  Objective: Vitals:   09/18/17 0505 09/18/17 0812 09/18/17 1020 09/18/17 1034  BP: (!) 141/58 139/61 (!) 144/56 (!) 125/54  Pulse:  70 64    Resp: 18 20    Temp: 97.7 F (36.5 C) (!) 97.4 F (36.3 C)    TempSrc: Oral Oral    SpO2: 97% 97%    Weight:      Height:        Intake/Output Summary (Last 24 hours) at 09/18/2017 1429 Last data filed at 09/18/2017 1415 Gross per 24 hour  Intake 580 ml  Output 3700 ml  Net -3120 ml   Filed Weights   09/16/17 1550  Weight: 68 kg   REVIEW OF SYSTEMS  As per history otherwise all reviewed and reported negative  Exam: Gen: Awake, Alert, Oriented X 3, chronically ill-appearing male HEENT: PERRLA, Neck supple, no JVD Lungs: decreased breath sounds both bases, left greater than right CVS: RRR,No Gallops,Rubs or new Murmurs Abd: soft, Non tender, non distended, BS present Extremities: 1+ edema Skin: no new rashes  Data Reviewed: Basic Metabolic Panel: Recent Labs  Lab 09/16/17 1558 09/16/17 2003 09/17/17 0503 09/18/17 0425  NA 139  --  137 135  K 4.1  --  3.7 3.5  CL 105  --  105 103  CO2 27  --  28 27  GLUCOSE 113*  --  108* 85  BUN 11  --  12 11  CREATININE 0.84 0.85 0.92 0.82  CALCIUM 8.7*  --  8.1* 7.9*  MG  --   --   --  1.9   Liver Function Tests: Recent Labs  Lab 09/16/17 1558 09/17/17 0503  AST 106* 91*  ALT 122* 104*  ALKPHOS 158* 140*  BILITOT 2.0* 1.9*  PROT 6.0* 5.5*  ALBUMIN 2.4* 2.2*   Recent Labs  Lab 09/16/17 1558  LIPASE 68*   Recent Labs  Lab 09/16/17 1558  AMMONIA 20   CBC: Recent Labs  Lab 09/16/17 1558 09/16/17 2003 09/17/17 0503 09/18/17 0425  WBC 7.3 5.8 5.6 6.0  NEUTROABS 5.9  --   --   --   HGB 13.5 11.7* 13.5 13.0  HCT 40.4 35.2* 39.7 38.0*  MCV 103.1* 102.3* 101.8* 99.5  PLT 157 141* 151 155   Cardiac Enzymes: No results for input(s): CKTOTAL, CKMB, CKMBINDEX, TROPONINI in the last 168 hours. CBG (last 3)  Recent Labs    09/16/17 1553  GLUCAP 106*   Recent Results (from the past 240 hour(s))  Blood Culture (routine x 2)     Status: None (Preliminary result)   Collection Time: 09/16/17  3:58  PM  Result Value Ref Range Status   Specimen Description BLOOD LEFT ANTECUBITAL  Final   Special Requests   Final    BOTTLES DRAWN AEROBIC AND ANAEROBIC Blood Culture results may not be optimal due to an inadequate volume of blood received in culture bottles   Culture   Final    NO GROWTH 2 DAYS Performed at [facility] [address] [place], [place] [address]    Report Status PENDING  Incomplete  Blood Culture (routine x 2)     Status: None (Preliminary result)   Collection Time: 09/16/17  4:16 PM  Result Value Ref Range Status   Specimen Description BLOOD RIGHT ANTECUBITAL  Final   Special Requests   Final    BOTTLES DRAWN AEROBIC AND ANAEROBIC Blood Culture results may not be optimal due to an inadequate volume of blood received in culture bottles   Culture   Final    NO GROWTH 2 DAYS Performed at [facility], [place] [address] [place], [place] [address]    Report Status PENDING  Incomplete  Gram stain     Status: None   Collection Time: 09/18/17 10:52 AM  Result Value Ref Range Status   Specimen Description PLEURAL RIGHT  Final   Special Requests NONE  Final   Gram Stain   Final    FEW WBC PRESENT, PREDOMINANTLY MONONUCLEAR NO ORGANISMS SEEN Performed at [facility], [address] [place], [place] [address]    Report Status 09/18/2017 FINAL  Final     Studies: Dg Chest 1 View  Result Date: 09/18/2017 CLINICAL DATA:  Post right thoracentesis EXAM: CHEST  1 VIEW COMPARISON:  09/16/2017 FINDINGS: Layering left pleural effusion. Left lower lobe atelectasis or infiltrate. Heart is normal size. Right lung clear. No acute bony abnormality. IMPRESSION: Layering left pleural effusion with left lower lobe atelectasis or infiltrate. Electronically Signed   By: Rolm Baptise M.D.   On: 09/18/2017 10:54   Ct Chest W Contrast  Result Date: 09/17/2017 CLINICAL DATA:  77 year old male with history of abdominal pain. Cirrhosis. Known hepatic mass. Sepsis with  pneumonia. Remote history of prostate cancer status post prostatectomy. EXAM: CT CHEST, ABDOMEN, AND PELVIS WITH CONTRAST TECHNIQUE: Multidetector CT imaging of the chest, abdomen and pelvis was performed following the standard protocol during bolus administration of intravenous contrast. CONTRAST:  111mL ISOVUE-300 IOPAMIDOL (ISOVUE-300) INJECTION 61%, <See Chart> ISOVUE-300 IOPAMIDOL (ISOVUE-300) INJECTION 61% COMPARISON:  None. FINDINGS: CT CHEST FINDINGS Cardiovascular: Heart size is mildly enlarged. There is no significant pericardial fluid, thickening or pericardial calcification. There is aortic atherosclerosis, as well as atherosclerosis of  the great vessels of the mediastinum and the coronary arteries, including calcified atherosclerotic plaque in the left main, left anterior descending, left circumflex and right coronary arteries. Severe thickening calcifications of the aortic valve. Calcifications of the mitral annulus. Mediastinum/Nodes: No pathologically enlarged mediastinal or hilar lymph nodes. Esophagus is unremarkable in appearance. No axillary lymphadenopathy. Lungs/Pleura: Moderate to large bilateral pleural effusions lying dependently. This is associated with areas of passive atelectasis in the lower lobes of both lungs. No consolidative airspace disease. In the posterior aspect of the left upper lobe (axial image 53 of series 4) there is a 1.3 x 0.7 x 1.3 cm nodular area of architectural distortion. A few patchy areas of ground-glass attenuation are also noted in the lungs, most evident in the left upper lobe on axial image 49 of series 4. Musculoskeletal: There are no aggressive appearing lytic or blastic lesions noted in the visualized portions of the skeleton. CT ABDOMEN PELVIS FINDINGS Hepatobiliary: In the right lobe of the liver there is a very large heterogeneously enhancing mass which appears bilobed, and overall measures 7.5 x 10.0 x 12.7 cm (axial image 63 of series 3 and coronal image  84 of series 6). Liver has a shrunken appearance and nodular contour, indicative of cirrhosis. No intra or extrahepatic biliary ductal dilatation. 7 mm calcified gallstone lying dependently in the gallbladder. Pancreas: No pancreatic mass. No pancreatic ductal dilatation. No pancreatic or peripancreatic fluid or inflammatory changes. Spleen: Unremarkable. Adrenals/Urinary Tract: Multiple well-defined low-attenuation nonenhancing lesions in both kidneys are compatible with simple cysts, largest of which measures 3.9 cm in the interpolar region of the left kidney. No hydroureteronephrosis. In the dependent portion of the urinary bladder there is a 7 mm calculus. Urinary bladder is otherwise unremarkable. Stomach/Bowel: Normal appearance of the stomach. No pathologic dilatation of small bowel or colon. Several colonic diverticulae are noted. Appendix is not confidently identified and may be surgically absent. Vascular/Lymphatic: Aortic atherosclerosis, without evidence of aneurysm or dissection in the abdominal or pelvic vasculature. Portal vein is dilated measuring 16 mm in the porta hepatis. Intrahepatic branches of the portal vein are diminutive. Numerous portosystemic collateral pathways are noted, most evident by a large left splenorenal shunt. Borderline enlarged and mildly enlarged aortocaval lymph nodes noted, measuring up to 11 mm in short axis (axial image 76 of series 3). Reproductive: Status post radical prostatectomy. Other: Small to moderate volume of ascites.  No pneumoperitoneum. Musculoskeletal: There are no aggressive appearing lytic or blastic lesions noted in the visualized portions of the skeleton. IMPRESSION: 1. Large malignant-appearing mass in the right lobe of the liver measuring 7.5 x 10.0 x 12.7 cm. Given the underlying cirrhosis, this presumably reflects a primary hepatocellular carcinoma. 2. 1.3 x 0.7 x 1.3 cm nodule in the left upper lobe posteriorly. The possibility of a metastatic lesion  should be considered, however, this may alternatively reflect an infectious or inflammatory nodule. Close attention on future follow-up imaging is recommended. 3. Small to moderate volume of ascites. 4. Moderate to large bilateral pleural effusions lying dependently with associated passive subsegmental atelectasis in the lower lobes of the lungs bilaterally. 5. Mild cardiomegaly. 6. Aortic atherosclerosis, in addition to left main and 3 vessel coronary artery disease. Assessment for potential risk factor modification, dietary therapy or pharmacologic therapy may be warranted, if clinically indicated. 7. There are calcifications of the aortic valve and mitral annulus. Echocardiographic correlation for evaluation of potential valvular dysfunction may be warranted if clinically indicated. 8. Colonic diverticulosis. 9. Additional incidental findings, as  above. Electronically Signed   By: Vinnie Langton M.D.   On: 09/17/2017 15:07   Ct Abdomen Pelvis W Contrast  Result Date: 09/17/2017 CLINICAL DATA:  77 year old male with history of abdominal pain. Cirrhosis. Known hepatic mass. Sepsis with pneumonia. Remote history of prostate cancer status post prostatectomy. EXAM: CT CHEST, ABDOMEN, AND PELVIS WITH CONTRAST TECHNIQUE: Multidetector CT imaging of the chest, abdomen and pelvis was performed following the standard protocol during bolus administration of intravenous contrast. CONTRAST:  111mL ISOVUE-300 IOPAMIDOL (ISOVUE-300) INJECTION 61%, <See Chart> ISOVUE-300 IOPAMIDOL (ISOVUE-300) INJECTION 61% COMPARISON:  None. FINDINGS: CT CHEST FINDINGS Cardiovascular: Heart size is mildly enlarged. There is no significant pericardial fluid, thickening or pericardial calcification. There is aortic atherosclerosis, as well as atherosclerosis of the great vessels of the mediastinum and the coronary arteries, including calcified atherosclerotic plaque in the left main, left anterior descending, left circumflex and right  coronary arteries. Severe thickening calcifications of the aortic valve. Calcifications of the mitral annulus. Mediastinum/Nodes: No pathologically enlarged mediastinal or hilar lymph nodes. Esophagus is unremarkable in appearance. No axillary lymphadenopathy. Lungs/Pleura: Moderate to large bilateral pleural effusions lying dependently. This is associated with areas of passive atelectasis in the lower lobes of both lungs. No consolidative airspace disease. In the posterior aspect of the left upper lobe (axial image 53 of series 4) there is a 1.3 x 0.7 x 1.3 cm nodular area of architectural distortion. A few patchy areas of ground-glass attenuation are also noted in the lungs, most evident in the left upper lobe on axial image 49 of series 4. Musculoskeletal: There are no aggressive appearing lytic or blastic lesions noted in the visualized portions of the skeleton. CT ABDOMEN PELVIS FINDINGS Hepatobiliary: In the right lobe of the liver there is a very large heterogeneously enhancing mass which appears bilobed, and overall measures 7.5 x 10.0 x 12.7 cm (axial image 63 of series 3 and coronal image 84 of series 6). Liver has a shrunken appearance and nodular contour, indicative of cirrhosis. No intra or extrahepatic biliary ductal dilatation. 7 mm calcified gallstone lying dependently in the gallbladder. Pancreas: No pancreatic mass. No pancreatic ductal dilatation. No pancreatic or peripancreatic fluid or inflammatory changes. Spleen: Unremarkable. Adrenals/Urinary Tract: Multiple well-defined low-attenuation nonenhancing lesions in both kidneys are compatible with simple cysts, largest of which measures 3.9 cm in the interpolar region of the left kidney. No hydroureteronephrosis. In the dependent portion of the urinary bladder there is a 7 mm calculus. Urinary bladder is otherwise unremarkable. Stomach/Bowel: Normal appearance of the stomach. No pathologic dilatation of small bowel or colon. Several colonic  diverticulae are noted. Appendix is not confidently identified and may be surgically absent. Vascular/Lymphatic: Aortic atherosclerosis, without evidence of aneurysm or dissection in the abdominal or pelvic vasculature. Portal vein is dilated measuring 16 mm in the porta hepatis. Intrahepatic branches of the portal vein are diminutive. Numerous portosystemic collateral pathways are noted, most evident by a large left splenorenal shunt. Borderline enlarged and mildly enlarged aortocaval lymph nodes noted, measuring up to 11 mm in short axis (axial image 76 of series 3). Reproductive: Status post radical prostatectomy. Other: Small to moderate volume of ascites.  No pneumoperitoneum. Musculoskeletal: There are no aggressive appearing lytic or blastic lesions noted in the visualized portions of the skeleton. IMPRESSION: 1. Large malignant-appearing mass in the right lobe of the liver measuring 7.5 x 10.0 x 12.7 cm. Given the underlying cirrhosis, this presumably reflects a primary hepatocellular carcinoma. 2. 1.3 x 0.7 x 1.3 cm nodule in  the left upper lobe posteriorly. The possibility of a metastatic lesion should be considered, however, this may alternatively reflect an infectious or inflammatory nodule. Close attention on future follow-up imaging is recommended. 3. Small to moderate volume of ascites. 4. Moderate to large bilateral pleural effusions lying dependently with associated passive subsegmental atelectasis in the lower lobes of the lungs bilaterally. 5. Mild cardiomegaly. 6. Aortic atherosclerosis, in addition to left main and 3 vessel coronary artery disease. Assessment for potential risk factor modification, dietary therapy or pharmacologic therapy may be warranted, if clinically indicated. 7. There are calcifications of the aortic valve and mitral annulus. Echocardiographic correlation for evaluation of potential valvular dysfunction may be warranted if clinically indicated. 8. Colonic diverticulosis. 9.  Additional incidental findings, as above. Electronically Signed   By: Vinnie Langton M.D.   On: 09/17/2017 15:07   Dg Chest Port 1 View  Result Date: 09/16/2017 CLINICAL DATA:  Generalized weakness. EXAM: PORTABLE CHEST 1 VIEW COMPARISON:  07/06/2009 FINDINGS: Artifact overlies the chest. The heart is mildly enlarged. There is aortic atherosclerosis. There is abnormal density in the mid and lower lungs bilaterally. I think this could either be due to congestive heart failure with early pulmonary edema or bilateral pneumonia left worse than right. No significant bone finding. IMPRESSION: Abnormal radiography with interstitial and alveolar density that could be either congestive heart failure/pulmonary edema or infectious pneumonia. Electronically Signed   By: Nelson Chimes M.D.   On: 09/16/2017 16:50   US Abdomen Limited Ruq  Result Date: 09/16/2017 CLINICAL DATA:  Elevated liver enzymes EXAM: ULTRASOUND ABDOMEN LIMITED RIGHT UPPER QUADRANT COMPARISON:  None. FINDINGS: Gallbladder: Stones and sludge are present within the gallbladder, largest measured stone at 8 mm. There is no gallbladder wall thickening. No sonographic Murphy's sign elicited. Common bile duct: Diameter: 4 mm. Liver: Diffusely heterogeneous parenchyma suggesting cirrhosis and/or fatty infiltration. Multiple masses are identified within the liver, the 2 largest are both located within the RIGHT liver lobe measuring 8 cm and 7.3 cm greatest dimension respectively. Portal vein is patent on color Doppler imaging with normal direction of blood flow towards the liver. Small amount of free fluid is seen within the RIGHT upper quadrant. IMPRESSION: 1. At least 2 large hepatic masses, both located within the RIGHT liver lobe, measuring 8 cm and 7.3 cm respectively. These are highly suspicious for neoplastic masses. If/when patient is clinically stable and able to follow directions and hold their breath (preferably as an outpatient) further  evaluation with dedicated liver MRI should be considered. 2. Suspect underlying liver cirrhosis. 3. Small amount of ascites within the RIGHT upper quadrant. 4. Cholelithiasis without convincing evidence of acute cholecystitis. No bile duct dilatation. Electronically Signed   By: Franki Cabot M.D.   On: 09/16/2017 19:26   US Thoracentesis Asp Pleural Space W/img Guide  Result Date: 09/18/2017 INDICATION: Bilateral asymptomatic pleural effusions - recent history of sepsis due to PNA; worsening BLE edema, hepatic masses on recent CT. Request for diagnostic thoracentesis. EXAM: ULTRASOUND GUIDED RIGHT THORACENTESIS MEDICATIONS: 10 mL 1% lidocaine. COMPLICATIONS: None immediate. PROCEDURE: An ultrasound guided thoracentesis was thoroughly discussed with the patient and questions answered. The benefits, risks, alternatives and complications were also discussed. The patient understands and wishes to proceed with the procedure. Written consent was obtained. Ultrasound was performed to localize and mark an adequate pocket of fluid in the right chest. The area was then prepped and draped in the normal sterile fashion. 1% Lidocaine was used for local anesthesia. Under ultrasound guidance  a 6 Fr Safe-T-Centesis catheter was introduced. Thoracentesis was performed. The catheter was removed and a dressing applied. FINDINGS: A total of approximately 680 mL of clear yellow fluid was removed. Samples were sent to the laboratory as requested by the clinical team. IMPRESSION: Successful ultrasound guided right thoracentesis yielding 680 mL of pleural fluid. Read by Candiss Norse, PA-C Electronically Signed   By: Jacqulynn Cadet M.D.   On: 09/18/2017 11:04   Scheduled Meds: . enoxaparin (LOVENOX) injection  40 mg Subcutaneous Q24H  . furosemide  40 mg Intravenous Q12H  . lidocaine (PF)       Continuous Infusions: . azithromycin 500 mg (09/17/17 1858)  . cefTRIAXone (ROCEPHIN)  IV 2 g (09/17/17 1812)    Principal  Problem:   Sepsis due to pneumonia ([place]) Active Problems:   Elevated liver function tests   Weakness   Toxic encephalopathy   Liver masses  Time spent:   Domenic Polite, MD,  [facility]  Please page via [email or web address]  If 7PM-7AM, please contact night-coverage [email or web address] Password [email or web address] 09/18/2017, 2:29 PM    LOS: 2 days

## 2017-09-19 LAB — CBC
HCT: 42.4 % (ref 39.0–52.0)
Hemoglobin: 14.5 g/dL (ref 13.0–17.0)
MCH: 34.3 pg — ABNORMAL HIGH (ref 26.0–34.0)
MCHC: 34.2 g/dL (ref 30.0–36.0)
MCV: 100.2 fL — ABNORMAL HIGH (ref 78.0–100.0)
PLATELETS: 182 10*3/uL (ref 150–400)
RBC: 4.23 MIL/uL (ref 4.22–5.81)
RDW: 13.7 % (ref 11.5–15.5)
WBC: 5.8 10*3/uL (ref 4.0–10.5)

## 2017-09-19 LAB — BASIC METABOLIC PANEL
ANION GAP: 6 (ref 5–15)
BUN: 12 mg/dL (ref 8–23)
CALCIUM: 8.4 mg/dL — AB (ref 8.9–10.3)
CO2: 30 mmol/L (ref 22–32)
Chloride: 102 mmol/L (ref 98–111)
Creatinine, Ser: 0.8 mg/dL (ref 0.61–1.24)
GFR calc Af Amer: >60 mL/min (ref >60)
Glucose, Bld: 95 mg/dL (ref 70–99)
Potassium: 4 mmol/L (ref 3.5–5.1)
SODIUM: 138 mmol/L (ref 135–145)

## 2017-09-19 LAB — AFP TUMOR MARKER: AFP, Serum, Tumor Marker: 78.6 ng/mL — ABNORMAL HIGH (ref 0.0–8.3)

## 2017-09-19 MED ORDER — AZITHROMYCIN 500 MG PO TABS
500.0000 mg | ORAL_TABLET | Freq: Every day | ORAL | Status: DC
  Administered 2017-09-19: 500 mg via ORAL
  Filled 2017-09-19: qty 1

## 2017-09-19 MED ORDER — FUROSEMIDE 10 MG/ML IJ SOLN
20.0000 mg | Freq: Two times a day (BID) | INTRAMUSCULAR | Status: DC
  Administered 2017-09-19 – 2017-09-20 (×2): 20 mg via INTRAVENOUS
  Filled 2017-09-19 (×2): qty 2

## 2017-09-19 NOTE — Progress Notes (Signed)
PROGRESS NOTE  Devin Reeves  ENI:[id number]  DOB: December 21, 1940  DOA: 09/16/2017 PCP: System, Pcp Not In   Brief Admission Hx: Devin Reeves is a 77 y.o. male with medical history significant of HTN, HLD who presented to the ED with increased weakness and fevers.   MDM/Assessment & Plan:   1. Large right liver mass    -Highly concerning for [facility]   -CT chest abdomen pelvis noted, nodular opacity noted in the lung which is concerning for distant metastasis and bilateral pleural effusions - Underwent thoracentesis 8/21, cytology negative for malignancy - AFP high, suggesting HCC, ordered CT-guided liver biopsy  2. Fever/pleural effusions -could be tumor fever, for now continue empiric antibiotics for pneumonia, stop after 5 days -CT chest without convincing evidence for pneumonia  3. Pleural effusions/edema - likely secondary to liver cirrhosis, worsened by hypoalbuminemia from ? Malignancy -continue IV Lasix , cut down dose -2-D echocardiogram with EF of 50-55%, unable to assess diastolic parameters  4. Liver cirrhosis - pt has a heavy alcohol history when he was younger, he reports only drinking wine occasionally now.        -Will need diuretics at DC, check hepatitis C serology  5. Generalized weakness - multifactorial -improving with diuresis -PT eval completed  6. Cognitive decline - Family reports progressive symptoms of memory loss for past several months.  He needs outpatient work up for this.  - B12, TSH -unremarkable  7. Acne rosacea - stable.    DVT prophylaxis: lovenox Code Status: Full  Family Communication: wife  At bedside Disposition Plan: home when medically stabilized  Procedures: Right thoracentesis 8/21 with 680 mL of clear fluid drained  Antimicrobials:  Ceftriaxone 8/19  Azithromycin 8/19  Subjective: Feels better, breathing improving  Objective: Vitals:   09/18/17 1709 09/18/17 2111 09/19/17 0511 09/19/17 0903  BP: 130/62 (!) 111/47 (!)  138/59 (!) 141/57  Pulse: 67 69 62 69  Resp: 20  18 18   Temp: 98.1 F (36.7 C) 97.8 F (36.6 C) 97.6 F (36.4 C) (!) 97.5 F (36.4 C)  TempSrc: Oral Oral Oral Oral  SpO2: 98% 97% 96% 98%  Weight:      Height:        Intake/Output Summary (Last 24 hours) at 09/19/2017 1415 Last data filed at 09/19/2017 1001 Gross per 24 hour  Intake 450 ml  Output 2800 ml  Net -2350 ml   Filed Weights   09/16/17 1550  Weight: 68 kg   REVIEW OF SYSTEMS  As per history otherwise all reviewed and reported negative  Exam: Gen: Awake, Alert, Oriented X 3, chronically ill-appearing male HEENT: PERRLA, Neck supple, no JVD Lungs: decreased breath sounds both bases, left greater than right CVS: RRR,No Gallops,Rubs or new Murmurs Abd: soft, Non tender, non distended, BS present Extremities: 1+ edema Skin: no new rashes  Data Reviewed: Basic Metabolic Panel: Recent Labs  Lab 09/16/17 1558 09/16/17 2003 09/17/17 0503 09/18/17 0425 09/19/17 0651  NA 139  --  137 135 138  K 4.1  --  3.7 3.5 4.0  CL 105  --  105 103 102  CO2 27  --  28 27 30   GLUCOSE 113*  --  108* 85 95  BUN 11  --  12 11 12   CREATININE 0.84 0.85 0.92 0.82 0.80  CALCIUM 8.7*  --  8.1* 7.9* 8.4*  MG  --   --   --  1.9  --    Liver Function Tests: Recent Labs  Lab 09/16/17  1558 09/17/17 0503  AST 106* 91*  ALT 122* 104*  ALKPHOS 158* 140*  BILITOT 2.0* 1.9*  PROT 6.0* 5.5*  ALBUMIN 2.4* 2.2*   Recent Labs  Lab 09/16/17 1558  LIPASE 68*   Recent Labs  Lab 09/16/17 1558  AMMONIA 20   CBC: Recent Labs  Lab 09/16/17 1558 09/16/17 2003 09/17/17 0503 09/18/17 0425 09/19/17 0651  WBC 7.3 5.8 5.6 6.0 5.8  NEUTROABS 5.9  --   --   --   --   HGB 13.5 11.7* 13.5 13.0 14.5  HCT 40.4 35.2* 39.7 38.0* 42.4  MCV 103.1* 102.3* 101.8* 99.5 100.2*  PLT 157 141* 151 155 182   Cardiac Enzymes: No results for input(s): CKTOTAL, CKMB, CKMBINDEX, TROPONINI in the last 168 hours. CBG (last 3)  Recent Labs     09/16/17 1553  GLUCAP 106*   Recent Results (from the past 240 hour(s))  Blood Culture (routine x 2)     Status: None (Preliminary result)   Collection Time: 09/16/17  3:58 PM  Result Value Ref Range Status   Specimen Description BLOOD LEFT ANTECUBITAL  Final   Special Requests   Final    BOTTLES DRAWN AEROBIC AND ANAEROBIC Blood Culture results may not be optimal due to an inadequate volume of blood received in culture bottles   Culture   Final    NO GROWTH 3 DAYS Performed at [facility] [address], [place], [place] [address]    Report Status PENDING  Incomplete  Blood Culture (routine x 2)     Status: None (Preliminary result)   Collection Time: 09/16/17  4:16 PM  Result Value Ref Range Status   Specimen Description BLOOD RIGHT ANTECUBITAL  Final   Special Requests   Final    BOTTLES DRAWN AEROBIC AND ANAEROBIC Blood Culture results may not be optimal due to an inadequate volume of blood received in culture bottles   Culture   Final    NO GROWTH 3 DAYS Performed at [facility], [place] [address], [place], [place] [address]    Report Status PENDING  Incomplete  Culture, body fluid-bottle     Status: None (Preliminary result)   Collection Time: 09/18/17 10:52 AM  Result Value Ref Range Status   Specimen Description PLEURAL RIGHT  Final   Special Requests NONE  Final   Culture   Final    NO GROWTH < 24 HOURS Performed at [facility], [place] [address], [place],  [address]    Report Status PENDING  Incomplete  Gram stain     Status: None   Collection Time: 09/18/17 10:52 AM  Result Value Ref Range Status   Specimen Description PLEURAL RIGHT  Final   Special Requests NONE  Final   Gram Stain   Final    FEW WBC PRESENT, PREDOMINANTLY MONONUCLEAR NO ORGANISMS SEEN Performed at [facility], [address] [place],  [address]    Report Status 09/18/2017 FINAL  Final     Studies: Dg Chest 1 View  Result Date: 09/18/2017 CLINICAL  DATA:  Post right thoracentesis EXAM: CHEST  1 VIEW COMPARISON:  09/16/2017 FINDINGS: Layering left pleural effusion. Left lower lobe atelectasis or infiltrate. Heart is normal size. Right lung clear. No acute bony abnormality. IMPRESSION: Layering left pleural effusion with left lower lobe atelectasis or infiltrate. Electronically Signed   By: Rolm Baptise M.D.   On: 09/18/2017 10:54   US Thoracentesis Asp Pleural Space  W/img Guide  Result Date: 09/18/2017 INDICATION: Bilateral asymptomatic pleural effusions - recent history of sepsis due to PNA; worsening BLE edema, hepatic masses on recent CT. Request for diagnostic thoracentesis. EXAM: ULTRASOUND GUIDED RIGHT THORACENTESIS MEDICATIONS: 10 mL 1% lidocaine. COMPLICATIONS: None immediate. PROCEDURE: An ultrasound guided thoracentesis was thoroughly discussed with the patient and questions answered. The benefits, risks, alternatives and complications were also discussed. The patient understands and wishes to proceed with the procedure. Written consent was obtained. Ultrasound was performed to localize and mark an adequate pocket of fluid in the right chest. The area was then prepped and draped in the normal sterile fashion. 1% Lidocaine was used for local anesthesia. Under ultrasound guidance a 6 Fr Safe-T-Centesis catheter was introduced. Thoracentesis was performed. The catheter was removed and a dressing applied. FINDINGS: A total of approximately 680 mL of clear yellow fluid was removed. Samples were sent to the laboratory as requested by the clinical team. IMPRESSION: Successful ultrasound guided right thoracentesis yielding 680 mL of pleural fluid. Read by Candiss Norse, PA-C Electronically Signed   By: Jacqulynn Cadet M.D.   On: 09/18/2017 11:04   Scheduled Meds: . enoxaparin (LOVENOX) injection  40 mg Subcutaneous Q24H  . furosemide  20 mg Intravenous Q12H  . potassium chloride  40 mEq Oral BID   Continuous Infusions: . azithromycin 500 mg  (09/18/17 1828)  . cefTRIAXone (ROCEPHIN)  IV 2 g (09/18/17 1742)    Principal Problem:   Sepsis due to pneumonia ([place]) Active Problems:   Elevated liver function tests   Weakness   Toxic encephalopathy   Liver masses  Time spent:   Domenic Polite, MD,  [facility]  Please page via [email or web address]  If 7PM-7AM, please contact night-coverage [email or web address] Password [email or web address] 09/19/2017, 2:15 PM    LOS: 3 days

## 2017-09-19 NOTE — Evaluation (Signed)
Physical Therapy Evaluation Patient Details Name: Devin Reeves MRN: [id number] DOB: 10/14/1940 Today's Date: 09/19/2017   History of Present Illness  77 y.o. male with medical history significant of HTN, HLD who presented to the ED with increased weakness and fevers. CT revealed large malignant-appearing mass in the right lobe of the liver.     Clinical Impression  Pt admitted with above diagnosis. Pt currently with functional limitations due to the deficits listed below (see PT Problem List). On eval,pt required min assist bed mobility, min assist sit to stand, and min to min guard assist ambulation 200 feet with RW. Pt will benefit from skilled PT to increase their independence and safety with mobility to allow discharge to the venue listed below.       Follow Up Recommendations Home health PT;Supervision/Assistance - 24 hour    Equipment Recommendations  Rolling walker with 5" wheels    Recommendations for Other Services       Precautions / Restrictions Precautions Precautions: Fall      Mobility  Bed Mobility Overal bed mobility: Needs Assistance Bed Mobility: Supine to Sit     Supine to sit: Min assist;HOB elevated     General bed mobility comments: +rail, assist to elevate trunk  Transfers Overall transfer level: Needs assistance Equipment used: Rolling walker (2 wheeled) Transfers: Sit to/from Stand Sit to Stand: Min assist         General transfer comment: cues for hand placement, assist to power up  Ambulation/Gait Ambulation/Gait assistance: Min guard;Min assist Gait Distance (Feet): 200 Feet Assistive device: Rolling walker (2 wheeled) Gait Pattern/deviations: Step-through pattern;Decreased stride length Gait velocity: decreased Gait velocity interpretation: 1.31 - 2.62 ft/sec, indicative of limited community ambulator General Gait Details: Initially pt required min assist and presented with shuffle gait. With distance, gait pattern became more  fluid with improved foot clearance bilat and assist decreased to min guard assist.  Stairs            Wheelchair Mobility    Modified Rankin (Stroke Patients Only)       Balance Overall balance assessment: Needs assistance Sitting-balance support: No upper extremity supported;Feet supported Sitting balance-Leahy Scale: Good     Standing balance support: Bilateral upper extremity supported;During functional activity Standing balance-Leahy Scale: Fair                               Pertinent Vitals/Pain Pain Assessment: No/denies pain    Home Living Family/patient expects to be discharged to:: Private residence Living Arrangements: Spouse/significant other Available Help at Discharge: Family;Available 24 hours/day Type of Home: House Home Access: Stairs to enter Entrance Stairs-Rails: None Entrance Stairs-Number of Steps: 1 Home Layout: Two level;Able to live on main level with bedroom/bathroom Home Equipment: None      Prior Function Level of Independence: Independent               Hand Dominance        Extremity/Trunk Assessment   Upper Extremity Assessment Upper Extremity Assessment: Defer to OT evaluation    Lower Extremity Assessment Lower Extremity Assessment: Generalized weakness    Cervical / Trunk Assessment Cervical / Trunk Assessment: Normal  Communication   Communication: No difficulties  Cognition Arousal/Alertness: Awake/alert Behavior During Therapy: WFL for tasks assessed/performed Overall Cognitive Status: Impaired/Different from baseline Area of Impairment: Attention;Memory;Following commands;Problem solving                   Current  Attention Level: Alternating Memory: Decreased short-term memory Following Commands: Follows multi-step commands with increased time     Problem Solving: Slow processing;Requires verbal cues        General Comments      Exercises     Assessment/Plan    PT  Assessment Patient needs continued PT services  PT Problem List Decreased strength;Decreased mobility;Decreased activity tolerance;Decreased balance;Decreased knowledge of use of DME       PT Treatment Interventions DME instruction;Therapeutic activities;Gait training;Therapeutic exercise;Patient/family education;Balance training;Stair training;Functional mobility training    PT Goals (Current goals can be found in the Care Plan section)  Acute Rehab PT Goals Patient Stated Goal: home PT Goal Formulation: With patient/family Time For Goal Achievement: 10/03/17 Potential to Achieve Goals: Good    Frequency Min 3X/week   Barriers to discharge        Co-evaluation               AM-PAC PT "6 Clicks" Daily Activity  Outcome Measure Difficulty turning over in bed (including adjusting bedclothes, sheets and blankets)?: A Little Difficulty moving from lying on back to sitting on the side of the bed? : A Lot Difficulty sitting down on and standing up from a chair with arms (e.g., wheelchair, bedside commode, etc,.)?: A Lot Help needed moving to and from a bed to chair (including a wheelchair)?: A Little Help needed walking in hospital room?: A Little Help needed climbing 3-5 steps with a railing? : A Lot 6 Click Score: 15    End of Session Equipment Utilized During Treatment: Gait belt Activity Tolerance: Patient tolerated treatment well Patient left: in chair;with call bell/phone within reach;with family/visitor present Nurse Communication: Mobility status PT Visit Diagnosis: Muscle weakness (generalized) (M62.81);Difficulty in walking, not elsewhere classified (R26.2)    Time: 6568-1275 PT Time Calculation (min) (ACUTE ONLY): 24 min   Charges:   PT Evaluation $PT Eval Low Complexity: 1 Low PT Treatments $Gait Training: 8-22 mins        Lorrin Goodell, PT  Office # [phone] Pager [phone]   Devin Reeves 09/19/2017, 11:41 AM

## 2017-09-19 NOTE — Care Management Important Message (Signed)
Important Message  Patient Details  Name: Devin Reeves MRN: [id number] Date of Birth: 03-Nov-1940   Medicare Important Message Given:  Yes    Byanka Landrus 09/19/2017, 2:02 PM

## 2017-09-19 NOTE — Progress Notes (Signed)
PHARMACIST - PHYSICIAN COMMUNICATION DR:   Broadus John CONCERNING: Antibiotic IV to Oral Route Change Policy  RECOMMENDATION: This patient is receiving Azithromycin by the intravenous route.  Based on criteria approved by the Pharmacy and Therapeutics Committee, the antibiotic(s) is/are being converted to the equivalent oral dose form(s).   DESCRIPTION: These criteria include:  Patient being treated for a respiratory tract infection, urinary tract infection, cellulitis or clostridium difficile associated diarrhea if on metronidazole  The patient is not neutropenic and does not exhibit a GI malabsorption state  The patient is eating (either orally or via tube) and/or has been taking other orally administered medications for a least 24 hours  The patient is improving clinically and has a Tmax < 100.5  If you have questions about this conversion, please contact the Pharmacy Department  []   [phone] )  [facility] []   [phone] )  [facility] [x]   [phone] )  [facility] []   [phone] )  [facility] []   [phone] )  [place], PharmD, BCPS Pager: [phone] 2:24 PM

## 2017-09-19 NOTE — Evaluation (Signed)
Occupational Therapy Evaluation Patient Details Name: Devin Reeves MRN: [id number] DOB: October 24, 1940 Today's Date: 09/19/2017    History of Present Illness 77 y.o. male with medical history significant of HTN, HLD who presented to the ED with increased weakness and fevers +PNA. CT revealed large malignant-appearing mass in the right lobe of the liver.    Clinical Impression   Pt was independent prior to admission and lives with his wife. He presents with generalized weakness, decreased activity tolerance and impaired standing balance. He requires min assist for mobility and ADL with use of RW. Pt is motivated to return to independence and does not wish to be a burden to his wife. Will follow acutely.    Follow Up Recommendations  Home health OT    Equipment Recommendations  3 in 1 bedside commode    Recommendations for Other Services       Precautions / Restrictions Precautions Precautions: Fall Restrictions Weight Bearing Restrictions: No      Mobility Bed Mobility      General bed mobility comments: pt seated in chair  Transfers Overall transfer level: Needs assistance Equipment used: Rolling walker (2 wheeled) Transfers: Sit to/from Stand Sit to Stand: Min assist         General transfer comment: cues for hand placement, assist to power up    Balance Overall balance assessment: Needs assistance Sitting-balance support: No upper extremity supported;Feet supported Sitting balance-Leahy Scale: Good     Standing balance support: Bilateral upper extremity supported;During functional activity Standing balance-Leahy Scale: Fair                             ADL either performed or assessed with clinical judgement   ADL Overall ADL's : Needs assistance/impaired Eating/Feeding: Independent;Sitting   Grooming: Wash/dry hands;Standing;Min guard   Upper Body Bathing: Minimal assistance;Sitting   Lower Body Bathing: Moderate assistance;Sit to/from  stand   Upper Body Dressing : Minimal assistance;Sitting   Lower Body Dressing: Sit to/from stand;Minimal assistance   Toilet Transfer: Minimal assistance;Ambulation;BSC;RW   Toileting- Clothing Manipulation and Hygiene: Minimal assistance;Sit to/from stand       Functional mobility during ADLs: Minimal assistance;Rolling walker General ADL Comments: Educated pt in availability and multiple uses of 3 in 1.      Vision Patient Visual Report: No change from baseline       Perception     Praxis      Pertinent Vitals/Pain Pain Assessment: No/denies pain     Hand Dominance Right   Extremity/Trunk Assessment Upper Extremity Assessment Upper Extremity Assessment: Generalized weakness   Lower Extremity Assessment Lower Extremity Assessment: Defer to PT evaluation   Cervical / Trunk Assessment Cervical / Trunk Assessment: Normal   Communication Communication Communication: No difficulties   Cognition Arousal/Alertness: Awake/alert Behavior During Therapy: WFL for tasks assessed/performed Overall Cognitive Status: Within Functional Limits for tasks assessed          General Comments       Exercises     Shoulder Instructions      Home Living Family/patient expects to be discharged to:: Private residence Living Arrangements: Spouse/significant other Available Help at Discharge: Family;Available 24 hours/day Type of Home: House Home Access: Stairs to enter [employer] of Steps: 1 Entrance Stairs-Rails: None Home Layout: Two level;Able to live on main level with bedroom/bathroom Alternate Level Stairs-Number of Steps: flight   Bathroom Shower/Tub: Occupational psychologist: Standard     Home Equipment: None  Prior Functioning/Environment Level of Independence: Independent                 OT Problem List: Decreased strength;Decreased activity tolerance;Impaired balance (sitting and/or standing);Decreased knowledge of  use of DME or AE      OT Treatment/Interventions: Self-care/ADL training;DME and/or AE instruction;Patient/family education;Balance training;Therapeutic activities    OT Goals(Current goals can be found in the care plan section) Acute Rehab OT Goals Patient Stated Goal: home OT Goal Formulation: With patient Time For Goal Achievement: 10/03/17 Potential to Achieve Goals: Good  OT Frequency: Min 2X/week   Barriers to D/C:            Co-evaluation              AM-PAC PT "6 Clicks" Daily Activity     Outcome Measure Help from another person eating meals?: None Help from another person taking care of personal grooming?: A Little Help from another person toileting, which includes using toliet, bedpan, or urinal?: A Little Help from another person bathing (including washing, rinsing, drying)?: A Little Help from another person to put on and taking off regular upper body clothing?: A Little Help from another person to put on and taking off regular lower body clothing?: A Little 6 Click Score: 19   End of Session Equipment Utilized During Treatment: Rolling walker;Gait belt  Activity Tolerance: Patient tolerated treatment well Patient left: in chair;with call bell/phone within reach  OT Visit Diagnosis: Unsteadiness on feet (R26.81);Other abnormalities of gait and mobility (R26.89);Muscle weakness (generalized) (M62.81)                Time: 9381-8299 OT Time Calculation (min): 24 min Charges:  OT General Charges $OT Visit: 1 Visit OT Evaluation $OT Eval Moderate Complexity: 1 Mod OT Treatments $Self Care/Home Management : 8-22 mins  Malka So 09/19/2017, 1:24 PM  09/19/2017 Nestor Lewandowsky, OTR/L Pager: [phone]

## 2017-09-19 NOTE — Progress Notes (Addendum)
Patient ID: Devin Reeves, male   DOB: 05-Apr-1940, 77 y.o.   MRN: [id number]   Request made for liver lesion bx  AFP 78.6 + cirrhosis  IMPRESSION: 1. Large malignant-appearing mass in the right lobe of the liver measuring 7.5 x 10.0 x 12.7 cm. Given the underlying cirrhosis, this presumably reflects a primary hepatocellular carcinoma. 2. 1.3 x 0.7 x 1.3 cm nodule in the left upper lobe posteriorly. The possibility of a metastatic lesion should be considered, however, this may alternatively reflect an infectious or inflammatory nodule. Close attention on future follow-up imaging is recommended. 3. Small to moderate volume of ascites. 4. Moderate to large bilateral pleural effusions lying dependently with associated passive subsegmental atelectasis in the lower lobes of the lungs bilaterally. 5. Mild cardiomegaly. 6. Aortic atherosclerosis, in addition to left main and 3 vessel coronary artery disease. Assessment for potential risk factor modification, dietary therapy or pharmacologic therapy may be warranted, if clinically indicated. 7. There are calcifications of the aortic valve and mitral annulus. Echocardiographic correlation for evaluation of potential valvular dysfunction may be warranted if clinically indicated. 8. Colonic diverticulosis. 9. Additional incidental findings, as above.  Discussed with Dr Anselm Pancoast With known elevated AFP; Cirrhosis; and CT findings--- probable HCC No need for biopsy Rec:  MRI  Liver protocol  (with and without Cx) for diagnosis  Dr Broadus John aware and agreeable

## 2017-09-20 ENCOUNTER — Inpatient Hospital Stay (HOSPITAL_COMMUNITY): Payer: Medicare Other

## 2017-09-20 DIAGNOSIS — J90 Pleural effusion, not elsewhere classified: Secondary | ICD-10-CM

## 2017-09-20 DIAGNOSIS — J189 Pneumonia, unspecified organism: Secondary | ICD-10-CM

## 2017-09-20 DIAGNOSIS — R160 Hepatomegaly, not elsewhere classified: Secondary | ICD-10-CM

## 2017-09-20 DIAGNOSIS — Z87891 Personal history of nicotine dependence: Secondary | ICD-10-CM

## 2017-09-20 DIAGNOSIS — A419 Sepsis, unspecified organism: Principal | ICD-10-CM

## 2017-09-20 DIAGNOSIS — Z91013 Allergy to seafood: Secondary | ICD-10-CM

## 2017-09-20 DIAGNOSIS — K7460 Unspecified cirrhosis of liver: Secondary | ICD-10-CM

## 2017-09-20 DIAGNOSIS — Z888 Allergy status to other drugs, medicaments and biological substances status: Secondary | ICD-10-CM

## 2017-09-20 DIAGNOSIS — I10 Essential (primary) hypertension: Secondary | ICD-10-CM

## 2017-09-20 LAB — COMPREHENSIVE METABOLIC PANEL
ALK PHOS: 142 U/L — AB (ref 38–126)
ALT: 118 U/L — ABNORMAL HIGH (ref 0–44)
ANION GAP: 7 (ref 5–15)
AST: 97 U/L — ABNORMAL HIGH (ref 15–41)
Albumin: 2.1 g/dL — ABNORMAL LOW (ref 3.5–5.0)
BUN: 14 mg/dL (ref 8–23)
CALCIUM: 8.6 mg/dL — AB (ref 8.9–10.3)
CHLORIDE: 106 mmol/L (ref 98–111)
CO2: 25 mmol/L (ref 22–32)
Creatinine, Ser: 0.69 mg/dL (ref 0.61–1.24)
GFR calc Af Amer: >60 mL/min (ref >60)
GFR calc non Af Amer: >60 mL/min (ref >60)
GLUCOSE: 84 mg/dL (ref 70–99)
Potassium: 4.3 mmol/L (ref 3.5–5.1)
SODIUM: 138 mmol/L (ref 135–145)
Total Bilirubin: 1.6 mg/dL — ABNORMAL HIGH (ref 0.3–1.2)
Total Protein: 5.8 g/dL — ABNORMAL LOW (ref 6.5–8.1)

## 2017-09-20 LAB — CBC
HCT: 43.3 % (ref 39.0–52.0)
HEMOGLOBIN: 14.7 g/dL (ref 13.0–17.0)
MCH: 34.3 pg — ABNORMAL HIGH (ref 26.0–34.0)
MCHC: 33.9 g/dL (ref 30.0–36.0)
MCV: 101.2 fL — ABNORMAL HIGH (ref 78.0–100.0)
PLATELETS: 176 10*3/uL (ref 150–400)
RBC: 4.28 MIL/uL (ref 4.22–5.81)
RDW: 13.8 % (ref 11.5–15.5)
WBC: 6.3 10*3/uL (ref 4.0–10.5)

## 2017-09-20 LAB — HEPATITIS C ANTIBODY: HCV Ab: <0.1 s/co ratio (ref 0.0–0.9)

## 2017-09-20 MED ORDER — GADOBENATE DIMEGLUMINE 529 MG/ML IV SOLN
15.0000 mL | Freq: Once | INTRAVENOUS | Status: AC
  Administered 2017-09-20: 14 mL via INTRAVENOUS

## 2017-09-20 MED ORDER — FUROSEMIDE 40 MG PO TABS
40.0000 mg | ORAL_TABLET | Freq: Two times a day (BID) | ORAL | Status: DC
  Administered 2017-09-20 – 2017-09-22 (×4): 40 mg via ORAL
  Filled 2017-09-20 (×4): qty 1

## 2017-09-20 MED ORDER — ENSURE ENLIVE PO LIQD
237.0000 mL | Freq: Two times a day (BID) | ORAL | Status: DC
  Administered 2017-09-21 – 2017-09-22 (×2): 237 mL via ORAL

## 2017-09-20 NOTE — Progress Notes (Signed)
PROGRESS NOTE  Devin Reeves  YKD:[id number]  DOB: 1940-12-03  DOA: 09/16/2017 PCP: System, Pcp Not In   Brief Admission Hx: Devin Reeves is a 77 y.o. male with medical history significant of HTN, HLD who presented to the ED with increased weakness and fevers.   MDM/Assessment & Plan:   1. Hepatocellular carcinoma-New diagnosis    -CT chest abdomen pelvis noted, nodular opacity noted in the lung which is concerning for distant metastasis and bilateral pleural effusions - Underwent thoracentesis 8/21, cytology negative for malignancy - AFP high, suggesting HCC, discussed with interventional radiology regarding biopsy who felt MRI liver would suffice, MRI liver completed this morning, suggestive of [facility] -Oncology consult requested -Hep C serology negative  2. Fever/pleural effusions -could be tumor fever, for now continue empiric antibiotics for pneumonia, discontinue antibiotics will complete 5 day course today -CT chest without convincing evidence for pneumonia, cultures negative  3. Pleural effusions/edema - likely secondary to liver cirrhosis, worsened by hypoalbuminemia from Malignancy -diuresed with IV Lasix, getting close to euvolemic at this time -2-D echocardiogram with EF of 50-55%, unable to assess diastolic parameters -transition to oral Lasix  4. Liver cirrhosis - pt has a heavy alcohol history when he was younger, he reports only drinking wine occasionally now.        -Will need diuretics at DC, hepatitis serology negative  5. Generalized weakness - multifactorial -improving with diuresis -PT eval completed, home health PT recommended  6. Cognitive decline - Family reports progressive symptoms of memory loss for past several months.  He needs outpatient work up for this.  - B12, TSH -unremarkable  7. Acne rosacea - stable.    DVT prophylaxis: lovenox Code Status: Full  Family Communication: wife  At bedside Disposition Plan: home soon, perhaps  saturday  Procedures: Right thoracentesis 8/21 with 680 mL of clear fluid drained  Antimicrobials:  Ceftriaxone 8/19  Azithromycin 8/19  Subjective: -feels okay, denies any shortness of breath -Denies nausea vomiting or abdominal pain  Objective: Vitals:   09/19/17 1700 09/19/17 2119 09/20/17 0503 09/20/17 0910  BP: 138/68 136/60 (!) 135/55 (!) 137/51  Pulse: 63 66 63 64  Resp: 18 18 16 18   Temp: 97.8 F (36.6 C) (!) 97.5 F (36.4 C) 97.7 F (36.5 C) 97.8 F (36.6 C)  TempSrc: Oral Oral Oral Oral  SpO2: 96% 96% 97% 99%  Weight:      Height:        Intake/Output Summary (Last 24 hours) at 09/20/2017 1355 Last data filed at 09/20/2017 0911 Gross per 24 hour  Intake 965.22 ml  Output 1500 ml  Net -534.78 ml   Filed Weights   09/16/17 1550  Weight: 68 kg   REVIEW OF SYSTEMS  As per history otherwise all reviewed and reported negative  Exam: Gen: Awake, Alert, Oriented X 3, thinly built ill-appearing male HEENT: PERRLA, Neck supple, no JVD Lungs: improved air movement, decreased breath sounds at both bases CVS: RRR,No Gallops,Rubs or new Murmurs Abd: soft, Non tender, non distended, BS present Extremities: 1+ edema Skin: no new rashes  Data Reviewed: Basic Metabolic Panel: Recent Labs  Lab 09/16/17 1558 09/16/17 2003 09/17/17 0503 09/18/17 0425 09/19/17 0651 09/20/17 0656  NA 139  --  137 135 138 138  K 4.1  --  3.7 3.5 4.0 4.3  CL 105  --  105 103 102 106  CO2 27  --  28 27 30 25   GLUCOSE 113*  --  108* 85 95 84  BUN  11  --  12 11 12 14   CREATININE 0.84 0.85 0.92 0.82 0.80 0.69  CALCIUM 8.7*  --  8.1* 7.9* 8.4* 8.6*  MG  --   --   --  1.9  --   --    Liver Function Tests: Recent Labs  Lab 09/16/17 1558 09/17/17 0503 09/20/17 0656  AST 106* 91* 97*  ALT 122* 104* 118*  ALKPHOS 158* 140* 142*  BILITOT 2.0* 1.9* 1.6*  PROT 6.0* 5.5* 5.8*  ALBUMIN 2.4* 2.2* 2.1*   Recent Labs  Lab 09/16/17 1558  LIPASE 68*   Recent Labs  Lab  09/16/17 1558  AMMONIA 20   CBC: Recent Labs  Lab 09/16/17 1558 09/16/17 2003 09/17/17 0503 09/18/17 0425 09/19/17 0651 09/20/17 0656  WBC 7.3 5.8 5.6 6.0 5.8 6.3  NEUTROABS 5.9  --   --   --   --   --   HGB 13.5 11.7* 13.5 13.0 14.5 14.7  HCT 40.4 35.2* 39.7 38.0* 42.4 43.3  MCV 103.1* 102.3* 101.8* 99.5 100.2* 101.2*  PLT 157 141* 151 155 182 176   Cardiac Enzymes: No results for input(s): CKTOTAL, CKMB, CKMBINDEX, TROPONINI in the last 168 hours. CBG (last 3)  No results for input(s): GLUCAP in the last 72 hours. Recent Results (from the past 240 hour(s))  Blood Culture (routine x 2)     Status: None (Preliminary result)   Collection Time: 09/16/17  3:58 PM  Result Value Ref Range Status   Specimen Description BLOOD LEFT ANTECUBITAL  Final   Special Requests   Final    BOTTLES DRAWN AEROBIC AND ANAEROBIC Blood Culture results may not be optimal due to an inadequate volume of blood received in culture bottles   Culture   Final    NO GROWTH 4 DAYS Performed at [facility], [place] [address] [place], [place] [address]    Report Status PENDING  Incomplete  Blood Culture (routine x 2)     Status: None (Preliminary result)   Collection Time: 09/16/17  4:16 PM  Result Value Ref Range Status   Specimen Description BLOOD RIGHT ANTECUBITAL  Final   Special Requests   Final    BOTTLES DRAWN AEROBIC AND ANAEROBIC Blood Culture results may not be optimal due to an inadequate volume of blood received in culture bottles   Culture   Final    NO GROWTH 4 DAYS Performed at [facility], [place] [address] [place], [place] [address]    Report Status PENDING  Incomplete  Culture, body fluid-bottle     Status: None (Preliminary result)   Collection Time: 09/18/17 10:52 AM  Result Value Ref Range Status   Specimen Description PLEURAL RIGHT  Final   Special Requests NONE  Final   Culture   Final    NO GROWTH 2 DAYS Performed at [facility], [place] [address]  [place], [place] [address]    Report Status PENDING  Incomplete  Gram stain     Status: None   Collection Time: 09/18/17 10:52 AM  Result Value Ref Range Status   Specimen Description PLEURAL RIGHT  Final   Special Requests NONE  Final   Gram Stain   Final    FEW WBC PRESENT, PREDOMINANTLY MONONUCLEAR NO ORGANISMS SEEN Performed at [facility], [address] [place], [place] [address]    Report Status 09/18/2017 FINAL  Final     Studies: Mr Liver W Wo Contrast  Result Date:  09/20/2017 CLINICAL DATA:  Known liver masses suspicious for hepatocellular carcinoma. Increasing weakness and fevers. Recent thoracentesis. EXAM: MRI ABDOMEN WITHOUT AND WITH CONTRAST TECHNIQUE: Multiplanar multisequence MR imaging of the abdomen was performed both before and after the administration of intravenous contrast. CONTRAST:  27mL MULTIHANCE GADOBENATE DIMEGLUMINE 529 MG/ML IV SOLN COMPARISON:  Abdominal ultrasound 09/16/2017. CT of the chest, abdomen and pelvis 09/17/2017. FINDINGS: Lower chest: The right pleural effusion appears decreased in volume. There are persistent left-greater-than-right pleural effusions with associated bibasilar atelectasis. The heart is enlarged. Hepatobiliary: Underlying morphologic changes of cirrhosis. Again demonstrated is a very large mass occupying most of the right hepatic lobe. This measures at least 13.0 x 9.2 cm transverse (image 16/6) and 12.8 cm cephalocaudad (image 14/4). This has an exophytic component superiorly. There is extension into the left hepatic lobe, probably within the portal vein, best seen on the T2 weighted images. Tumor also extends into the middle hepatic vein and IVC. The lesion demonstrates heterogeneous T2 hyperintensity, arterial phase enhancement and mild delayed washout, consistent with hepatocellular carcinoma. Cholelithiasis without gallbladder wall thickening or significant biliary dilatation. Pancreas: Unremarkable. No pancreatic ductal dilatation  or surrounding inflammatory changes. Spleen: Normal in size without focal abnormality. Adrenals/Urinary Tract: Both adrenal glands appear normal. Bilateral renal cysts. No evidence of enhancing renal mass or hydronephrosis. Stomach/Bowel: No evidence of bowel wall thickening, distention or surrounding inflammatory change. Vascular/Lymphatic: There are small lymph nodes within the porta hepatis and retroperitoneum, including an 8 mm aortocaval node on image 74/1002. Aortic and branch vessel atherosclerosis, better seen on CT. There are large portosystemic collateral vessels in the left abdomen, inferior to the spleen related to a splenorenal shunt. Other: There is generalized soft tissue edema throughout the subcutaneous and intra-abdominal fat. A small to moderate amount of ascites is present. Musculoskeletal: No acute or significant osseous findings. IMPRESSION: 1. Again demonstrated is a large hepatic mass with intravascular invasion involving the hepatic veins, IVC and portal vein. This remains most consistent with hepatocellular carcinoma. 2. Generalized soft tissue edema with ascites and bilateral pleural effusions. Right pleural effusion has decreased in volume from previous CT. 3. Underlying hepatic cirrhosis with multiple portosystemic collaterals consistent with portal hypertension. Electronically Signed   By: Richardean Sale M.D.   On: 09/20/2017 12:27   Scheduled Meds: . enoxaparin (LOVENOX) injection  40 mg Subcutaneous Q24H  . furosemide  40 mg Oral BID  . potassium chloride  40 mEq Oral BID   Continuous Infusions:   Principal Problem:   Sepsis due to pneumonia ([place]) Active Problems:   Elevated liver function tests   Weakness   Toxic encephalopathy   Liver masses  Time spent:   Domenic Polite, MD,  [facility]  Please page via [email or web address]  If 7PM-7AM, please contact night-coverage [email or web address] Password [email or web address] 09/20/2017, 1:55 PM    LOS: 4 days

## 2017-09-20 NOTE — Progress Notes (Signed)
Initial Nutrition Assessment  DOCUMENTATION CODES:   Severe malnutrition in context of acute illness/injury  INTERVENTION:  - Will order Ensure Enlive po BID, each supplement provides 350 kcal and 20 grams of protein - Provided low sodium diet education.  - Continue to encourage PO intakes.   NUTRITION DIAGNOSIS:   Severe Malnutrition related to acute illness, catabolic illness, cancer and cancer related treatments as evidenced by moderate muscle depletion, moderate fat depletion.  GOAL:   Patient will meet greater than or equal to 90% of their needs  MONITOR:   PO intake, Supplement acceptance, Weight trends, Labs  REASON FOR ASSESSMENT:   Consult Poor PO  ASSESSMENT:   77 y.o. male with medical history significant of HTN, HLD, and cirrhosis. He presented to the ED with increased weakness and fevers.  New dx of hepatocellular carcinoma this admission.   BMI indicates normal weight. Patient and two daughters at bedside. Patient lives with his wife, who patient reports is a Systems developer. They mainly eat healthy options. Patient currently on Heart Healthy diet but sometimes does not like the taste of foods provided here. Family asking about bringing in foods. Talked with patient and family about focusing on </= 2000 mg sodium/day and provided explanation for low sodium diet. Provided handouts from the Academy of [employer] Dietetics: Low Sodium Nutrition Therapy, Sodium Content of Foods list, and Sodium-Free Flavoring Tips. Family asked questions concerning kcal and protein goals, asked about specific foods, and asked about meal frequency. All questions answered. Family also asked about fluid restriction; deferred to MDs.   Patient reports UBW of 155 lb and he believes that he has lost weight over the past 2 months. Current weight of 155 lb. Patient had R thoracentesis on 8/21 and 680 mL removed. Per family report, patient ate ~25% of lunch today and they plan to get him items from  [facility] for dinner. RN reported that patient drank a full chocolate Ensure this afternoon.    Medications reviewed; 40 mg IV Lasix BID, 40 mEq oral KCl BID.  Labs reviewed; Ca: 8.6 mg/dL, Alk Phos elevated, LFTs elevated.     NUTRITION - FOCUSED PHYSICAL EXAM:    Most Recent Value  Orbital Region  Mild depletion  Upper Arm Region  Moderate depletion  Thoracic and Lumbar Region  Unable to assess  Buccal Region  Moderate depletion  Temple Region  Mild depletion  Clavicle Bone Region  Moderate depletion  Clavicle and Acromion Bone Region  Moderate depletion  Scapular Bone Region  Moderate depletion  Dorsal Hand  Mild depletion  Patellar Region  Moderate depletion  Anterior Thigh Region  Moderate depletion  Posterior Calf Region  Moderate depletion  Edema (RD Assessment)  Mild  Hair  Reviewed  Eyes  Reviewed  Mouth  Reviewed  Skin  Reviewed  Nails  Reviewed       Diet Order:   Diet Order            Diet Heart Room service appropriate? Yes; Fluid consistency: Thin  Diet effective now              EDUCATION NEEDS:   Education needs have been addressed  Skin:  Skin Assessment: Reviewed RN Assessment  Last BM:  8/23  Height:   Ht Readings from Last 1 Encounters:  09/16/17 5' 8"  (1.727 m)    Weight:   Wt Readings from Last 1 Encounters:  09/16/17 68 kg    Ideal Body Weight:  70 kg  BMI:  Body mass index is 22.81 kg/m.  Estimated Nutritional Needs:   Kcal:  3174-0992 (30-33 kcal/kg)  Protein:  95-110 grams (1.4-1.6 grams/kg)  Fluid:  >/= 1.5 L/day     Jarome Matin, MS, RD, LDN, [facility] Inpatient Clinical Dietitian Pager # [phone] After hours/weekend pager # [phone]

## 2017-09-20 NOTE — Consult Note (Signed)
Reason for the request: Liver mass  HPI: I was asked by Dr. Broadus John  to evaluate Devin Reeves for new finding of liver mass in the setting of cirrhosis of the liver.  He is a 77 year old gentleman native of [place] currently resides in this area with his wife.  His children predominate live in [place].  He has a history of hypertension as well as heavy alcohol use in the past who was hospitalized on 09/16/2017 with complaints of fevers and bilateral pulmonary infiltrates as well as effusion.  He was treated empirically for possible pneumonia and presumed sepsis.  He underwent thoracentesis on the right side of the lung which yielded 680 cc with a cytology did not reveal any malignancy.  Imaging study of the chest abdomen and pelvis obtained on 09/17/2017 showed a large hepatic mass measuring 7.5 x 10.0 x 12.7 cm with cirrhosis findings associated with it.  There is a 1.3 x 0.7 x 1.3 cm nodule area of the left upper lobe of the lung was also noted.  Moderate to large bilateral effusion were noted as well.  His alpha-fetoprotein was elevated at 78.6.  MRI of the liver obtained on 09/20/2017 showed the same large hepatic mass with intravascular invasion into the hepatic veins, IVC and portal vein which is consistent with hepatocellular carcinoma.  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  Clinically, he is still quite debilitated although he is eating slightly better.  He still overall weak and reporting his respiratory symptoms slightly improved.  He still has intermittent fevers.  He denies any abdominal discomfort.  He does not report any headaches, blurry vision, syncope or seizures.  Memory issues has been reported by his family.  Does not report any fevers, chills or sweats.  Does not report any cough, wheezing or hemoptysis.  Does not report any chest pain, palpitation, orthopnea or leg edema.  Does not report any nausea, vomiting or abdominal pain.  Does not report any  constipation or diarrhea.  Does not report any skeletal complaints.    Does not report frequency, urgency or hematuria.  Does not report any skin rashes or lesions. Does not report any heat or cold intolerance.  Does not report any lymphadenopathy or petechiae.  Remaining review of systems is negative.    Past Medical History:  Diagnosis Date  . Acne rosacea   . Hyperlipidemia   . Hypertension   . Nocturia   :  Past Surgical History:  Procedure Laterality Date  . TONSILLECTOMY    :   Current Facility-Administered Medications:  .  enoxaparin (LOVENOX) injection 40 mg, 40 mg, Subcutaneous, Q24H, Devin Hazel, MD, 40 mg at 09/19/17 1803 .  furosemide (LASIX) tablet 40 mg, 40 mg, Oral, BID, Devin Polite, MD .  iopamidol (ISOVUE-300) 61 % injection 100 mL, 100 mL, Intravenous, Once PRN, Devin Reeves, Devin L, MD .  potassium chloride SA (K-DUR,KLOR-CON) CR tablet 40 mEq, 40 mEq, Oral, BID, Devin Polite, MD, 40 mEq at 09/20/17 1202:  Allergies  Allergen Reactions  . Oysters [Shellfish Allergy]   . Doxycycline Rash  . Hydroxyzine Hcl Rash  :  History reviewed. No pertinent family history.:  Social History   Socioeconomic History  . Marital status: Married    Spouse name: Not on file  . Number of children: Not on file  . Years of education: Not on file  . Highest education level: Not on file  Occupational History  . Not on file  Social Needs  .  Financial resource strain: Not on file  . Food insecurity:    Worry: Not on file    Inability: Not on file  . Transportation needs:    Medical: Not on file    Non-medical: Not on file  Tobacco Use  . Smoking status: Former Research scientist (life sciences)  . Smokeless tobacco: Never Used  Substance and Sexual Activity  . Alcohol use: Yes  . Drug use: No  . Sexual activity: Not on file  Lifestyle  . Physical activity:    Days per week: Not on file    Minutes per session: Not on file  . Stress: Not on file  Relationships  . Social  connections:    Talks on phone: Not on file    Gets together: Not on file    Attends religious service: Not on file    Active member of club or organization: Not on file    Attends meetings of clubs or organizations: Not on file    Relationship status: Not on file  . Intimate partner violence:    Fear of current or ex partner: Not on file    Emotionally abused: Not on file    Physically abused: Not on file    Forced sexual activity: Not on file  Other Topics Concern  . Not on file  Social History Narrative  . Not on file  :  Pertinent items are noted in HPI.  Exam: Blood pressure (!) 137/51, pulse 64, temperature 97.8 F (36.6 C), temperature source Oral, resp. rate 18, height 5\' 8"  (1.727 m), weight 150 lb (68 kg), SpO2 99 %.  ECOG 2 General appearance: Chronically ill-appearing gentleman without distress. Head: atraumatic without any abnormalities. Eyes: conjunctivae/corneas clear. PERRL.  Sclera anicteric. Throat: lips, mucosa, and tongue normal; without oral thrush or ulcers. Resp: clear to auscultation bilaterally without rhonchi, wheezes or dullness to percussion. Cardio: regular rate and rhythm, S1, S2 normal, no murmur, click, rub or gallop GI: soft, non-tender; bowel sounds normal; no masses,  no organomegaly Skin: Skin color, texture, turgor normal. No rashes or lesions Lymph nodes: Cervical, supraclavicular, and axillary nodes normal. Neurologic: Grossly normal without any motor, sensory or deep tendon reflexes. Musculoskeletal: No joint deformity or effusion.  Recent Labs    09/19/17 0651 09/20/17 0656  WBC 5.8 6.3  HGB 14.5 14.7  HCT 42.4 43.3  PLT 182 176   Recent Labs    09/19/17 0651 09/20/17 0656  NA 138 138  K 4.0 4.3  CL 102 106  CO2 30 25  GLUCOSE 95 84  BUN 12 14  CREATININE 0.80 0.69  CALCIUM 8.4* 8.6*          Ct Chest W Contrast  Result Date: 09/17/2017 CLINICAL DATA:  77 year old male with history of abdominal pain.  Cirrhosis. Known hepatic mass. Sepsis with pneumonia. Remote history of prostate cancer status post prostatectomy. EXAM: CT CHEST, ABDOMEN, AND PELVIS WITH CONTRAST TECHNIQUE: Multidetector CT imaging of the chest, abdomen and pelvis was performed following the standard protocol during bolus administration of intravenous contrast. CONTRAST:  144mL ISOVUE-300 IOPAMIDOL (ISOVUE-300) INJECTION 61%, <See Chart> ISOVUE-300 IOPAMIDOL (ISOVUE-300) INJECTION 61% COMPARISON:  None. FINDINGS: CT CHEST FINDINGS Cardiovascular: Heart size is mildly enlarged. There is no significant pericardial fluid, thickening or pericardial calcification. There is aortic atherosclerosis, as well as atherosclerosis of the great vessels of the mediastinum and the coronary arteries, including calcified atherosclerotic plaque in the left main, left anterior descending, left circumflex and right coronary arteries. Severe thickening calcifications of  the aortic valve. Calcifications of the mitral annulus. Mediastinum/Nodes: No pathologically enlarged mediastinal or hilar lymph nodes. Esophagus is unremarkable in appearance. No axillary lymphadenopathy. Lungs/Pleura: Moderate to large bilateral pleural effusions lying dependently. This is associated with areas of passive atelectasis in the lower lobes of both lungs. No consolidative airspace disease. In the posterior aspect of the left upper lobe (axial image 53 of series 4) there is a 1.3 x 0.7 x 1.3 cm nodular area of architectural distortion. A few patchy areas of ground-glass attenuation are also noted in the lungs, most evident in the left upper lobe on axial image 49 of series 4. Musculoskeletal: There are no aggressive appearing lytic or blastic lesions noted in the visualized portions of the skeleton. CT ABDOMEN PELVIS FINDINGS Hepatobiliary: In the right lobe of the liver there is a very large heterogeneously enhancing mass which appears bilobed, and overall measures 7.5 x 10.0 x 12.7 cm  (axial image 63 of series 3 and coronal image 84 of series 6). Liver has a shrunken appearance and nodular contour, indicative of cirrhosis. No intra or extrahepatic biliary ductal dilatation. 7 mm calcified gallstone lying dependently in the gallbladder. Pancreas: No pancreatic mass. No pancreatic ductal dilatation. No pancreatic or peripancreatic fluid or inflammatory changes. Spleen: Unremarkable. Adrenals/Urinary Tract: Multiple well-defined low-attenuation nonenhancing lesions in both kidneys are compatible with simple cysts, largest of which measures 3.9 cm in the interpolar region of the left kidney. No hydroureteronephrosis. In the dependent portion of the urinary bladder there is a 7 mm calculus. Urinary bladder is otherwise unremarkable. Stomach/Bowel: Normal appearance of the stomach. No pathologic dilatation of small bowel or colon. Several colonic diverticulae are noted. Appendix is not confidently identified and may be surgically absent. Vascular/Lymphatic: Aortic atherosclerosis, without evidence of aneurysm or dissection in the abdominal or pelvic vasculature. Portal vein is dilated measuring 16 mm in the porta hepatis. Intrahepatic branches of the portal vein are diminutive. Numerous portosystemic collateral pathways are noted, most evident by a large left splenorenal shunt. Borderline enlarged and mildly enlarged aortocaval lymph nodes noted, measuring up to 11 mm in short axis (axial image 76 of series 3). Reproductive: Status post radical prostatectomy. Other: Small to moderate volume of ascites.  No pneumoperitoneum. Musculoskeletal: There are no aggressive appearing lytic or blastic lesions noted in the visualized portions of the skeleton. IMPRESSION: 1. Large malignant-appearing mass in the right lobe of the liver measuring 7.5 x 10.0 x 12.7 cm. Given the underlying cirrhosis, this presumably reflects a primary hepatocellular carcinoma. 2. 1.3 x 0.7 x 1.3 cm nodule in the left upper lobe  posteriorly. The possibility of a metastatic lesion should be considered, however, this may alternatively reflect an infectious or inflammatory nodule. Close attention on future follow-up imaging is recommended. 3. Small to moderate volume of ascites. 4. Moderate to large bilateral pleural effusions lying dependently with associated passive subsegmental atelectasis in the lower lobes of the lungs bilaterally. 5. Mild cardiomegaly. 6. Aortic atherosclerosis, in addition to left main and 3 vessel coronary artery disease. Assessment for potential risk factor modification, dietary therapy or pharmacologic therapy may be warranted, if clinically indicated. 7. There are calcifications of the aortic valve and mitral annulus. Echocardiographic correlation for evaluation of potential valvular dysfunction may be warranted if clinically indicated. 8. Colonic diverticulosis. 9. Additional incidental findings, as above. Electronically Signed   By: Devin Reeves M.D.   On: 09/17/2017 15:07   Ct Abdomen Pelvis W Contrast  Result Date: 09/17/2017 CLINICAL DATA:  77 year old male  with history of abdominal pain. Cirrhosis. Known hepatic mass. Sepsis with pneumonia. Remote history of prostate cancer status post prostatectomy. EXAM: CT CHEST, ABDOMEN, AND PELVIS WITH CONTRAST TECHNIQUE: Multidetector CT imaging of the chest, abdomen and pelvis was performed following the standard protocol during bolus administration of intravenous contrast. CONTRAST:  164mL ISOVUE-300 IOPAMIDOL (ISOVUE-300) INJECTION 61%, <See Chart> ISOVUE-300 IOPAMIDOL (ISOVUE-300) INJECTION 61% COMPARISON:  None. FINDINGS: CT CHEST FINDINGS Cardiovascular: Heart size is mildly enlarged. There is no significant pericardial fluid, thickening or pericardial calcification. There is aortic atherosclerosis, as well as atherosclerosis of the great vessels of the mediastinum and the coronary arteries, including calcified atherosclerotic plaque in the left main, left  anterior descending, left circumflex and right coronary arteries. Severe thickening calcifications of the aortic valve. Calcifications of the mitral annulus. Mediastinum/Nodes: No pathologically enlarged mediastinal or hilar lymph nodes. Esophagus is unremarkable in appearance. No axillary lymphadenopathy. Lungs/Pleura: Moderate to large bilateral pleural effusions lying dependently. This is associated with areas of passive atelectasis in the lower lobes of both lungs. No consolidative airspace disease. In the posterior aspect of the left upper lobe (axial image 53 of series 4) there is a 1.3 x 0.7 x 1.3 cm nodular area of architectural distortion. A few patchy areas of ground-glass attenuation are also noted in the lungs, most evident in the left upper lobe on axial image 49 of series 4. Musculoskeletal: There are no aggressive appearing lytic or blastic lesions noted in the visualized portions of the skeleton. CT ABDOMEN PELVIS FINDINGS Hepatobiliary: In the right lobe of the liver there is a very large heterogeneously enhancing mass which appears bilobed, and overall measures 7.5 x 10.0 x 12.7 cm (axial image 63 of series 3 and coronal image 84 of series 6). Liver has a shrunken appearance and nodular contour, indicative of cirrhosis. No intra or extrahepatic biliary ductal dilatation. 7 mm calcified gallstone lying dependently in the gallbladder. Pancreas: No pancreatic mass. No pancreatic ductal dilatation. No pancreatic or peripancreatic fluid or inflammatory changes. Spleen: Unremarkable. Adrenals/Urinary Tract: Multiple well-defined low-attenuation nonenhancing lesions in both kidneys are compatible with simple cysts, largest of which measures 3.9 cm in the interpolar region of the left kidney. No hydroureteronephrosis. In the dependent portion of the urinary bladder there is a 7 mm calculus. Urinary bladder is otherwise unremarkable. Stomach/Bowel: Normal appearance of the stomach. No pathologic dilatation  of small bowel or colon. Several colonic diverticulae are noted. Appendix is not confidently identified and may be surgically absent. Vascular/Lymphatic: Aortic atherosclerosis, without evidence of aneurysm or dissection in the abdominal or pelvic vasculature. Portal vein is dilated measuring 16 mm in the porta hepatis. Intrahepatic branches of the portal vein are diminutive. Numerous portosystemic collateral pathways are noted, most evident by a large left splenorenal shunt. Borderline enlarged and mildly enlarged aortocaval lymph nodes noted, measuring up to 11 mm in short axis (axial image 76 of series 3). Reproductive: Status post radical prostatectomy. Other: Small to moderate volume of ascites.  No pneumoperitoneum. Musculoskeletal: There are no aggressive appearing lytic or blastic lesions noted in the visualized portions of the skeleton. IMPRESSION: 1. Large malignant-appearing mass in the right lobe of the liver measuring 7.5 x 10.0 x 12.7 cm. Given the underlying cirrhosis, this presumably reflects a primary hepatocellular carcinoma. 2. 1.3 x 0.7 x 1.3 cm nodule in the left upper lobe posteriorly. The possibility of a metastatic lesion should be considered, however, this may alternatively reflect an infectious or inflammatory nodule. Close attention on future follow-up imaging is  recommended. 3. Small to moderate volume of ascites. 4. Moderate to large bilateral pleural effusions lying dependently with associated passive subsegmental atelectasis in the lower lobes of the lungs bilaterally. 5. Mild cardiomegaly. 6. Aortic atherosclerosis, in addition to left main and 3 vessel coronary artery disease. Assessment for potential risk factor modification, dietary therapy or pharmacologic therapy may be warranted, if clinically indicated. 7. There are calcifications of the aortic valve and mitral annulus. Echocardiographic correlation for evaluation of potential valvular dysfunction may be warranted if clinically  indicated. 8. Colonic diverticulosis. 9. Additional incidental findings, as above. Electronically Signed   By: Devin Reeves M.D.   On: 09/17/2017 15:07   Mr Liver W Wo Contrast  Result Date: 09/20/2017 CLINICAL DATA:  Known liver masses suspicious for hepatocellular carcinoma. Increasing weakness and fevers. Recent thoracentesis. EXAM: MRI ABDOMEN WITHOUT AND WITH CONTRAST TECHNIQUE: Multiplanar multisequence MR imaging of the abdomen was performed both before and after the administration of intravenous contrast. CONTRAST:  53mL MULTIHANCE GADOBENATE DIMEGLUMINE 529 MG/ML IV SOLN COMPARISON:  Abdominal ultrasound 09/16/2017. CT of the chest, abdomen and pelvis 09/17/2017. FINDINGS: Lower chest: The right pleural effusion appears decreased in volume. There are persistent left-greater-than-right pleural effusions with associated bibasilar atelectasis. The heart is enlarged. Hepatobiliary: Underlying morphologic changes of cirrhosis. Again demonstrated is a very large mass occupying most of the right hepatic lobe. This measures at least 13.0 x 9.2 cm transverse (image 16/6) and 12.8 cm cephalocaudad (image 14/4). This has an exophytic component superiorly. There is extension into the left hepatic lobe, probably within the portal vein, best seen on the T2 weighted images. Tumor also extends into the middle hepatic vein and IVC. The lesion demonstrates heterogeneous T2 hyperintensity, arterial phase enhancement and mild delayed washout, consistent with hepatocellular carcinoma. Cholelithiasis without gallbladder wall thickening or significant biliary dilatation. Pancreas: Unremarkable. No pancreatic ductal dilatation or surrounding inflammatory changes. Spleen: Normal in size without focal abnormality. Adrenals/Urinary Tract: Both adrenal glands appear normal. Bilateral renal cysts. No evidence of enhancing renal mass or hydronephrosis. Stomach/Bowel: No evidence of bowel wall thickening, distention or  surrounding inflammatory change. Vascular/Lymphatic: There are small lymph nodes within the porta hepatis and retroperitoneum, including an 8 mm aortocaval node on image 74/1002. Aortic and branch vessel atherosclerosis, better seen on CT. There are large portosystemic collateral vessels in the left abdomen, inferior to the spleen related to a splenorenal shunt. Other: There is generalized soft tissue edema throughout the subcutaneous and intra-abdominal fat. A small to moderate amount of ascites is present. Musculoskeletal: No acute or significant osseous findings. IMPRESSION: 1. Again demonstrated is a large hepatic mass with intravascular invasion involving the hepatic veins, IVC and portal vein. This remains most consistent with hepatocellular carcinoma. 2. Generalized soft tissue edema with ascites and bilateral pleural effusions. Right pleural effusion has decreased in volume from previous CT. 3. Underlying hepatic cirrhosis with multiple portosystemic collaterals consistent with portal hypertension. Electronically Signed   By: Devin Reeves M.D.   On: 09/20/2017 12:27     Assessment and Plan:    77 year old man with the following:  1.  Hepatic mass measuring 13 x 9.2 cm involving the right hepatic lobe with involvement into the portal vein, hepatic vein and IVC documented on MRI in August 2019.  This was detected in the setting of elevated alpha-fetoprotein and cirrhosis of the liver.  These findings are highly suspicious for hepatocellular carcinoma even without tissue biopsy.  The natural course of this disease was discussed today with the patient  and his 2 daughters that were present today.  Given his age, debilitation, the extent of this tumor I do not feel that curative surgery is an option for him.  Any treatment would be directed towards palliating this disease, shrinking the liver mass and potentially delaying progression of disease.  These options would include liver directed therapy,  oral targeted therapy and immunotherapy.  From a management standpoint, his case will be discussed in the GI tumor board tentatively next week and I have arranged a follow-up for him to see my colleague Dr. Burr Reeves who specializes in treating hepatocellular carcinoma.  2.  Bilateral pleural effusion and lung nodule: His pleural effusion did not show any malignant cells although his pulmonary nodule could be a site of metastasis.  I agree with the current management which include diuresis and improving his protein intake.  3.  Prognosis: This was discussed today in detail with the patient and his 2 daughters.  He understands that this disease is likely incurable and he is interested in any treatment to prolong his life.  I have discussed the role of palliative care and hospice potentially if he desires no treatment at any point.  He made it abundantly clear that anything can be done should be done to prolong his life.  His performance status is marginal and his prognosis I fear is overall poor and I urged his daughters who live in [place] to be present in his next outpatient appointment.  4.  Disposition: I have no objections to discharge once medically ready per the primary team.  He has tentative appointment with Dr. Burr Reeves on October 01, 2017.   60  minutes of floor time as well as face-to-face with the patient today.  More than 50% of time was dedicated to reviewing imaging studies, laboratory data, discussing the natural course of this disease, prognosis and treatment options.

## 2017-09-20 NOTE — Progress Notes (Signed)
Occupational Therapy Treatment Patient Details Name: Devin Reeves MRN: [id number] DOB: [birth year] Today's Date: 09/20/2017    History of present illness 77 y.o. male with medical history significant of HTN, HLD who presented to the ED with increased weakness and fevers +PNA. CT revealed large malignant-appearing mass in the right lobe of the liver.    OT comments  Pt stating he plans to have treatment for his cancer as he wants to live 5 years. Pt requiring min assist for ADL and mobility this visit. Daughters participating in session, educated in fall prevention.  Follow Up Recommendations  Home health OT    Equipment Recommendations  3 in 1 bedside commode    Recommendations for Other Services      Precautions / Restrictions Precautions Precautions: Fall Restrictions Weight Bearing Restrictions: No       Mobility Bed Mobility               General bed mobility comments: pt in chair  Transfers Overall transfer level: Needs assistance Equipment used: Rolling walker (2 wheeled) Transfers: Sit to/from Stand Sit to Stand: Min guard;Min assist         General transfer comment: cues for hand placement, min assist to steady    Balance Overall balance assessment: Needs assistance   Sitting balance-Leahy Scale: Good       Standing balance-Leahy Scale: Fair Standing balance comment: statically                           ADL either performed or assessed with clinical judgement   ADL Overall ADL's : Needs assistance/impaired     Grooming: Wash/dry hands;Min guard;Standing           Upper Body Dressing : Minimal assistance;Sitting Upper Body Dressing Details (indicate cue type and reason): front opening gown     Toilet Transfer: Minimal assistance;Ambulation;RW;BSC(over toilet)   Toileting- Clothing Manipulation and Hygiene: Minimal assistance;Sit to/from stand       Functional mobility during ADLs: Minimal assistance;Rolling  walker(min assist for walker management in tight spaces) General ADL Comments: provided daughters with gait belt and instructed in use     Vision       Perception     Praxis      Cognition Arousal/Alertness: Awake/alert Behavior During Therapy: WFL for tasks assessed/performed Overall Cognitive Status: Impaired/Different from baseline Area of Impairment: Memory;Safety/judgement                     Memory: Decreased short-term memory   Safety/Judgement: Decreased awareness of safety   Problem Solving: Slow processing;Requires verbal cues          Exercises     Shoulder Instructions       General Comments      Pertinent Vitals/ Pain       Pain Assessment: No/denies pain  Home Living                                          Prior Functioning/Environment              Frequency  Min 2X/week        Progress Toward Goals  OT Goals(current goals can now be found in the care plan section)  Progress towards OT goals: Progressing toward goals  Acute Rehab OT Goals Patient Stated Goal: home OT Goal Formulation: With  patient Time For Goal Achievement: 10/03/17 Potential to Achieve Goals: Good  Plan Discharge plan remains appropriate    Co-evaluation                 AM-PAC PT "6 Clicks" Daily Activity     Outcome Measure   Help from another person eating meals?: None Help from another person taking care of personal grooming?: A Little Help from another person toileting, which includes using toliet, bedpan, or urinal?: A Little Help from another person bathing (including washing, rinsing, drying)?: A Little Help from another person to put on and taking off regular upper body clothing?: A Little Help from another person to put on and taking off regular lower body clothing?: A Little 6 Click Score: 19    End of Session Equipment Utilized During Treatment: Rolling walker;Gait belt  OT Visit Diagnosis: Unsteadiness on  feet (R26.81);Other abnormalities of gait and mobility (R26.89);Muscle weakness (generalized) (M62.81)   Activity Tolerance Patient tolerated treatment well   Patient Left in chair;with call bell/phone within reach;with chair alarm set;with family/visitor present   Nurse Communication          Time: 1771-1657 OT Time Calculation (min): 17 min  Charges: OT General Charges $OT Visit: 1 Visit OT Treatments $Self Care/Home Management : 8-22 mins  09/20/2017 Devin Reeves, OTR/L Pager: [phone]   Devin Reeves 09/20/2017, 3:57 PM

## 2017-09-20 NOTE — Progress Notes (Signed)
Physical Therapy Treatment Patient Details Name: Devin Reeves MRN: [id number] DOB: 07/07/1940 Today's Date: 09/20/2017    History of Present Illness 77 y.o. male with medical history significant of HTN, HLD who presented to the ED with increased weakness and fevers +PNA. CT revealed large malignant-appearing mass in the right lobe of the liver.     PT Comments    Pt in bed on arrival agreeable to participation in therapy. He states that walking is the best part of his day. He required min assist bed mobility,min assist sit to stand and min guard assist ambulation 225 feet with RW. Pt returned to bed at end of session as transport was waiting to take pt to MRI.    Follow Up Recommendations  Home health PT;Supervision/Assistance - 24 hour     Equipment Recommendations  Rolling walker with 5" wheels    Recommendations for Other Services       Precautions / Restrictions Precautions Precautions: Fall    Mobility  Bed Mobility         Supine to sit: Min assist;HOB elevated     General bed mobility comments: +rail, increased time and effort  Transfers   Equipment used: Rolling walker (2 wheeled)   Sit to Stand: Min assist         General transfer comment: cues for hand placement, assist to power up  Ambulation/Gait Ambulation/Gait assistance: Min guard Gait Distance (Feet): 225 Feet Assistive device: Rolling walker (2 wheeled) Gait Pattern/deviations: Step-through pattern;Decreased stride length Gait velocity: decreased Gait velocity interpretation: 1.31 - 2.62 ft/sec, indicative of limited community ambulator General Gait Details: initial shuffle pattern progressing to improved bilat foot clearance   Stairs             Wheelchair Mobility    Modified Rankin (Stroke Patients Only)       Balance   Sitting-balance support: No upper extremity supported;Feet supported Sitting balance-Leahy Scale: Good     Standing balance support: Bilateral  upper extremity supported;During functional activity Standing balance-Leahy Scale: Fair                              Cognition Arousal/Alertness: Awake/alert Behavior During Therapy: WFL for tasks assessed/performed Overall Cognitive Status: Impaired/Different from baseline                         Following Commands: Follows multi-step commands with increased time     Problem Solving: Slow processing;Requires verbal cues        Exercises      General Comments        Pertinent Vitals/Pain Pain Assessment: No/denies pain    Home Living                      Prior Function            PT Goals (current goals can now be found in the care plan section) Acute Rehab PT Goals Patient Stated Goal: home PT Goal Formulation: With patient/family Time For Goal Achievement: 10/03/17 Potential to Achieve Goals: Good Progress towards PT goals: Progressing toward goals    Frequency    Min 3X/week      PT Plan Current plan remains appropriate    Co-evaluation              AM-PAC PT "6 Clicks" Daily Activity  Outcome Measure  Difficulty turning over in bed (including adjusting bedclothes,  sheets and blankets)?: A Little Difficulty moving from lying on back to sitting on the side of the bed? : A Lot Difficulty sitting down on and standing up from a chair with arms (e.g., wheelchair, bedside commode, etc,.)?: A Lot Help needed moving to and from a bed to chair (including a wheelchair)?: A Little Help needed walking in hospital room?: A Little Help needed climbing 3-5 steps with a railing? : A Lot 6 Click Score: 15    End of Session Equipment Utilized During Treatment: Gait belt Activity Tolerance: Patient tolerated treatment well Patient left: in bed;with family/visitor present;with bed alarm set Nurse Communication: Mobility status PT Visit Diagnosis: Muscle weakness (generalized) (M62.81);Difficulty in walking, not elsewhere  classified (R26.2)     Time: 4469-5072 PT Time Calculation (min) (ACUTE ONLY): 15 min  Charges:  $Gait Training: 8-22 mins                     Lorrin Goodell, PT  Office # [phone] Pager [phone]    Lorriane Shire 09/20/2017, 10:46 AM

## 2017-09-21 DIAGNOSIS — E43 Unspecified severe protein-calorie malnutrition: Secondary | ICD-10-CM

## 2017-09-21 LAB — BASIC METABOLIC PANEL
ANION GAP: 7 (ref 5–15)
BUN: 19 mg/dL (ref 8–23)
CALCIUM: 8.8 mg/dL — AB (ref 8.9–10.3)
CO2: 26 mmol/L (ref 22–32)
CREATININE: 0.72 mg/dL (ref 0.61–1.24)
Chloride: 105 mmol/L (ref 98–111)
GFR calc Af Amer: >60 mL/min (ref >60)
GLUCOSE: 82 mg/dL (ref 70–99)
Potassium: 4.5 mmol/L (ref 3.5–5.1)
Sodium: 138 mmol/L (ref 135–145)

## 2017-09-21 LAB — CBC
HCT: 41.8 % (ref 39.0–52.0)
Hemoglobin: 14.1 g/dL (ref 13.0–17.0)
MCH: 34.4 pg — AB (ref 26.0–34.0)
MCHC: 33.7 g/dL (ref 30.0–36.0)
MCV: 102 fL — AB (ref 78.0–100.0)
PLATELETS: 152 10*3/uL (ref 150–400)
RBC: 4.1 MIL/uL — ABNORMAL LOW (ref 4.22–5.81)
RDW: 13.7 % (ref 11.5–15.5)
WBC: 6.3 10*3/uL (ref 4.0–10.5)

## 2017-09-21 LAB — CULTURE, BLOOD (ROUTINE X 2)
Culture: NO GROWTH
Culture: NO GROWTH

## 2017-09-21 MED ORDER — POTASSIUM CHLORIDE CRYS ER 20 MEQ PO TBCR: 40.0000 meq | EXTENDED_RELEASE_TABLET | Freq: Two times a day (BID) | ORAL | 0 refills | Status: DC

## 2017-09-21 MED ORDER — POTASSIUM CHLORIDE CRYS ER 20 MEQ PO TBCR
40.0000 meq | EXTENDED_RELEASE_TABLET | Freq: Every day | ORAL | Status: DC
  Administered 2017-09-22: 40 meq via ORAL
  Filled 2017-09-21: qty 2

## 2017-09-21 MED ORDER — ENSURE ENLIVE PO LIQD: 237.0000 mL | Freq: Two times a day (BID) | ORAL | 2 refills | Status: DC

## 2017-09-21 MED ORDER — FUROSEMIDE 40 MG PO TABS: 40.0000 mg | ORAL_TABLET | Freq: Two times a day (BID) | ORAL | Status: DC

## 2017-09-21 NOTE — Care Management Note (Addendum)
Case Management Note  Patient Details  Name: Devin Reeves MRN: [id number] Date of Birth: Nov 19, 1940  Subjective/Objective:                Pt presented for weakness and fevers from home with wife.  Pt newly diagnosed with hepatocarcinoma.  Pt to return home with 3n1, RW and HH PT, OT, NA.   Pt and family want to set up personal care services.  Discussed options at length and role of HH.  Pt lives with wife, who may not be able to lift him. His daughter is visiting from [place].  They plan to arrange PCS ASAP and for family to stay until then.  They wanted to explore SNF options but patient has been walking well with PT and staff.  Daughter states patient gets anxious when family tries to help him.  Pt [facility] with [facility].   Action/Plan: Discussed DME needs with patient and family.  RW and 3n1 ordered from [place] with [place].    Family took [facility] list to explore options and will f/u with CM tomorrow prior to d/c.   Expected Discharge Date:  09/22/17               Expected Discharge Plan:  [place]  In-House Referral:  NA  Discharge planning Services  CM Consult  Post Acute Care Choice:  Home Health, Durable Medical Equipment Choice offered to:  Patient, Adult Children, Spouse  DME Arranged:  3-N-1, Walker rolling DME Agency:  [place]:  PT, OT, Nurse's Aide [place] Agency:     Status of Service:  In process, will continue to follow  If discussed at Long Length of Stay Meetings, dates discussed:    Additional Comments:  Claudie Leach, RN 09/21/2017, 2:13 PM

## 2017-09-21 NOTE — Progress Notes (Signed)
PROGRESS NOTE  Devin Reeves  YQI:[id number]  DOB: 29-Oct-1940  DOA: 09/16/2017 PCP: System, Pcp Not In   Brief Admission Hx: Devin Reeves is a 77 y.o. male with medical history significant of HTN, HLD who presented to the ED with increased weakness and fevers.   MDM/Assessment & Plan:   1. Hepatocellular carcinoma-New diagnosis    -CT chest abdomen pelvis noted, nodular opacity noted in the lung which is concerning for distant metastasis and bilateral pleural effusions - Underwent thoracentesis 8/21, cytology negative for malignancy - AFP high, suggesting HCC, discussed with interventional radiology regarding biopsy who felt MRI liver would suffice, MRI liver completed, suggestive of [facility] -Oncology consult greatly appreciated, treatment will be not curative, recommended follow-up with Dr.Feng next week at the cancer center -Hep C serology negative  2. Fever/pleural effusions -could be tumor fever, for now continue empiric antibiotics for pneumonia, discontinue antibiotics will complete 5 day course today -CT chest without convincing evidence for pneumonia, cultures negative -stable, continue oral Lasix  3. Pleural effusions/edema - likely secondary to liver cirrhosis, worsened by hypoalbuminemia from Malignancy -diuresed with IV Lasix, getting close to euvolemic at this time -2-D echocardiogram with EF of 50-55%, unable to assess diastolic parameters -continue Lasix 40 mg twice a day with potassium -Electrolytes are stable  4. Liver cirrhosis - pt has a heavy alcohol history when he was younger, he reports only drinking wine occasionally now.        -Lasix twice a day at DC, hepatitis serology negative  5. Generalized weakness - multifactorial -improving with diuresis -PT eval completed, home health PT recommended -home health services set up, family concerned about need for more help, case manager consult requested  6. Cognitive decline - Family reports progressive  symptoms of memory loss for past several months.  He needs outpatient work up for this.  - B12, TSH -unremarkable  7. Acne rosacea - stable.    DVT prophylaxis: lovenox Code Status: Full  Family Communication: no family at bedside called and updated wife Disposition Plan: home tomorrow 8/25  Procedures: Right thoracentesis 8/21 with 680 mL of clear fluid drained  Antimicrobials:  Ceftriaxone 8/19  Azithromycin 8/19  Consults -Oncology  Subjective: -feels better breathing improving, appetite and mobility improving as well  Objective: Vitals:   09/20/17 1708 09/20/17 2055 09/21/17 0520 09/21/17 1012  BP: (!) 122/59 (!) 122/53 (!) 132/42 (!) 136/57  Pulse: 70 67 63 67  Resp: 18 (!) 28 14 18   Temp: 98.1 F (36.7 C) 98.1 F (36.7 C) (!) 97.4 F (36.3 C) 97.6 F (36.4 C)  TempSrc: Oral Oral Oral Oral  SpO2: 100% 96% 97% 98%  Weight:      Height:        Intake/Output Summary (Last 24 hours) at 09/21/2017 1332 Last data filed at 09/21/2017 0900 Gross per 24 hour  Intake 240 ml  Output 851 ml  Net -611 ml   Filed Weights   09/16/17 1550  Weight: 68 kg   REVIEW OF SYSTEMS  As per history otherwise all reviewed and reported negative  Exam: Gen: frail, thinly built elderly male, laying in bed, no distress HEENT: PERRLA, Neck supple, no JVD Lungs: improved air movement, clear bilaterally CVS: RRR,No Gallops,Rubs or new Murmurs Abd: soft, Non tender, non distended, BS present Extremities: trace edema Skin: no new rashes  Data Reviewed: Basic Metabolic Panel: Recent Labs  Lab 09/17/17 0503 09/18/17 0425 09/19/17 0651 09/20/17 0656 09/21/17 0604  NA 137 135 138 138 138  K 3.7 3.5 4.0 4.3 4.5  CL 105 103 102 106 105  CO2 28 27 30 25 26   GLUCOSE 108* 85 95 84 82  BUN 12 11 12 14 19   CREATININE 0.92 0.82 0.80 0.69 0.72  CALCIUM 8.1* 7.9* 8.4* 8.6* 8.8*  MG  --  1.9  --   --   --    Liver Function Tests: Recent Labs  Lab 09/16/17 1558 09/17/17 0503  09/20/17 0656  AST 106* 91* 97*  ALT 122* 104* 118*  ALKPHOS 158* 140* 142*  BILITOT 2.0* 1.9* 1.6*  PROT 6.0* 5.5* 5.8*  ALBUMIN 2.4* 2.2* 2.1*   Recent Labs  Lab 09/16/17 1558  LIPASE 68*   Recent Labs  Lab 09/16/17 1558  AMMONIA 20   CBC: Recent Labs  Lab 09/16/17 1558  09/17/17 0503 09/18/17 0425 09/19/17 0651 09/20/17 0656 09/21/17 0604  WBC 7.3   < > 5.6 6.0 5.8 6.3 6.3  NEUTROABS 5.9  --   --   --   --   --   --   HGB 13.5   < > 13.5 13.0 14.5 14.7 14.1  HCT 40.4   < > 39.7 38.0* 42.4 43.3 41.8  MCV 103.1*   < > 101.8* 99.5 100.2* 101.2* 102.0*  PLT 157   < > 151 155 182 176 152   < > = values in this interval not displayed.   Cardiac Enzymes: No results for input(s): CKTOTAL, CKMB, CKMBINDEX, TROPONINI in the last 168 hours. CBG (last 3)  No results for input(s): GLUCAP in the last 72 hours. Recent Results (from the past 240 hour(s))  Blood Culture (routine x 2)     Status: None   Collection Time: 09/16/17  3:58 PM  Result Value Ref Range Status   Specimen Description BLOOD LEFT ANTECUBITAL  Final   Special Requests   Final    BOTTLES DRAWN AEROBIC AND ANAEROBIC Blood Culture results may not be optimal due to an inadequate volume of blood received in culture bottles   Culture   Final    NO GROWTH 5 DAYS Performed at [facility], [place] [address], [place], [place] [address]    Report Status 09/21/2017 FINAL  Final  Blood Culture (routine x 2)     Status: None   Collection Time: 09/16/17  4:16 PM  Result Value Ref Range Status   Specimen Description BLOOD RIGHT ANTECUBITAL  Final   Special Requests   Final    BOTTLES DRAWN AEROBIC AND ANAEROBIC Blood Culture results may not be optimal due to an inadequate volume of blood received in culture bottles   Culture   Final    NO GROWTH 5 DAYS Performed at [facility], [place] [address], [place], [place] [address]    Report Status 09/21/2017 FINAL  Final  Culture, body fluid-bottle     Status:  None (Preliminary result)   Collection Time: 09/18/17 10:52 AM  Result Value Ref Range Status   Specimen Description PLEURAL RIGHT  Final   Special Requests NONE  Final   Culture   Final    NO GROWTH 3 DAYS Performed at [facility] [address] [place], [place] [address]    Report Status PENDING  Incomplete  Gram stain     Status: None   Collection Time: 09/18/17 10:52 AM  Result Value Ref Range Status   Specimen Description PLEURAL RIGHT  Final   Special Requests NONE  Final  Gram Stain   Final    FEW WBC PRESENT, PREDOMINANTLY MONONUCLEAR NO ORGANISMS SEEN Performed at [facility] [address] [place], [place] [address]    Report Status 09/18/2017 FINAL  Final     Studies: Mr Liver W Wo Contrast  Result Date: 09/20/2017 CLINICAL DATA:  Known liver masses suspicious for hepatocellular carcinoma. Increasing weakness and fevers. Recent thoracentesis. EXAM: MRI ABDOMEN WITHOUT AND WITH CONTRAST TECHNIQUE: Multiplanar multisequence MR imaging of the abdomen was performed both before and after the administration of intravenous contrast. CONTRAST:  83mL MULTIHANCE GADOBENATE DIMEGLUMINE 529 MG/ML IV SOLN COMPARISON:  Abdominal ultrasound 09/16/2017. CT of the chest, abdomen and pelvis 09/17/2017. FINDINGS: Lower chest: The right pleural effusion appears decreased in volume. There are persistent left-greater-than-right pleural effusions with associated bibasilar atelectasis. The heart is enlarged. Hepatobiliary: Underlying morphologic changes of cirrhosis. Again demonstrated is a very large mass occupying most of the right hepatic lobe. This measures at least 13.0 x 9.2 cm transverse (image 16/6) and 12.8 cm cephalocaudad (image 14/4). This has an exophytic component superiorly. There is extension into the left hepatic lobe, probably within the portal vein, best seen on the T2 weighted images. Tumor also extends into the middle hepatic vein and IVC. The lesion demonstrates  heterogeneous T2 hyperintensity, arterial phase enhancement and mild delayed washout, consistent with hepatocellular carcinoma. Cholelithiasis without gallbladder wall thickening or significant biliary dilatation. Pancreas: Unremarkable. No pancreatic ductal dilatation or surrounding inflammatory changes. Spleen: Normal in size without focal abnormality. Adrenals/Urinary Tract: Both adrenal glands appear normal. Bilateral renal cysts. No evidence of enhancing renal mass or hydronephrosis. Stomach/Bowel: No evidence of bowel wall thickening, distention or surrounding inflammatory change. Vascular/Lymphatic: There are small lymph nodes within the porta hepatis and retroperitoneum, including an 8 mm aortocaval node on image 74/1002. Aortic and branch vessel atherosclerosis, better seen on CT. There are large portosystemic collateral vessels in the left abdomen, inferior to the spleen related to a splenorenal shunt. Other: There is generalized soft tissue edema throughout the subcutaneous and intra-abdominal fat. A small to moderate amount of ascites is present. Musculoskeletal: No acute or significant osseous findings. IMPRESSION: 1. Again demonstrated is a large hepatic mass with intravascular invasion involving the hepatic veins, IVC and portal vein. This remains most consistent with hepatocellular carcinoma. 2. Generalized soft tissue edema with ascites and bilateral pleural effusions. Right pleural effusion has decreased in volume from previous CT. 3. Underlying hepatic cirrhosis with multiple portosystemic collaterals consistent with portal hypertension. Electronically Signed   By: Richardean Sale M.D.   On: 09/20/2017 12:27   Scheduled Meds: . enoxaparin (LOVENOX) injection  40 mg Subcutaneous Q24H  . feeding supplement (ENSURE ENLIVE)  237 mL Oral BID BM  . furosemide  40 mg Oral BID  . potassium chloride  40 mEq Oral BID   Continuous Infusions:   Principal Problem:   Sepsis due to pneumonia  ([place]) Active Problems:   Elevated liver function tests   Weakness   Toxic encephalopathy   Liver masses   Protein-calorie malnutrition, severe  Time spent:   Domenic Polite, MD,  [facility]  Please page via [email or web address]  If 7PM-7AM, please contact night-coverage [email or web address] Password [email or web address] 09/21/2017, 1:32 PM    LOS: 5 days

## 2017-09-22 LAB — BASIC METABOLIC PANEL
Anion gap: 6 (ref 5–15)
BUN: 16 mg/dL (ref 8–23)
CO2: 27 mmol/L (ref 22–32)
Calcium: 8.6 mg/dL — ABNORMAL LOW (ref 8.9–10.3)
Chloride: 103 mmol/L (ref 98–111)
Creatinine, Ser: 0.67 mg/dL (ref 0.61–1.24)
GFR calc Af Amer: >60 mL/min (ref >60)
GLUCOSE: 103 mg/dL — AB (ref 70–99)
Potassium: 4.1 mmol/L (ref 3.5–5.1)
Sodium: 136 mmol/L (ref 135–145)

## 2017-09-22 MED ORDER — POTASSIUM CHLORIDE CRYS ER 20 MEQ PO TBCR: 40.0000 meq | EXTENDED_RELEASE_TABLET | Freq: Every day | ORAL | 0 refills | Status: DC

## 2017-09-22 MED ORDER — ENSURE ENLIVE PO LIQD: 237.0000 mL | Freq: Two times a day (BID) | ORAL | 2 refills | Status: DC

## 2017-09-22 MED ORDER — FUROSEMIDE 40 MG PO TABS: 40.0000 mg | ORAL_TABLET | Freq: Two times a day (BID) | ORAL | 0 refills | Status: DC

## 2017-09-22 NOTE — Discharge Summary (Signed)
Physician Discharge Summary  Devin Reeves EYC:[id number] DOB: 21-Jan-1941 DOA: 09/16/2017  PCP: System, Pcp Not In  Admit date: 09/16/2017 Discharge date: 09/22/2017  Time spent: 45 minutes  Recommendations for Outpatient Follow-up:  1. PCP in week 2. Oncology Dr.Feng in 1 week   Discharge Diagnoses:    Hepatocellular Carcinoma   Pleural effusions   Elevated liver function tests   Weakness   Toxic encephalopathy   Liver masses   Protein-calorie malnutrition, severe   Cognitive dysfunction  Discharge Condition: stable  Diet recommendation: heart healthy  Filed Weights   09/16/17 1550 09/22/17 0100  Weight: 68 kg 67.4 kg    History of present illness:  Devin Reeves a 77 y.o.malewith medical history significant ofHTN, HLD who presented to the ED with increased weakness and fevers.  Hospital Course:   1. Hepatocellular carcinoma-New diagnosis    -CT chest abdomen pelvis noted, nodular opacity noted in the lung which is concerning for distant metastasis and bilateral pleural effusions - Underwent thoracentesis 8/21, cytology negative for malignancy - AFP high, suggesting HCC, discussed with interventional radiology regarding biopsy who felt MRI liver would suffice, MRI liver completed, suggestive of [facility] -Oncology consulted, seen by Dr.Shadad, treatment will be not curative, recommended follow-up with Dr.Feng next week at the cancer center -Hep C serology negative  2. Fever -could be tumor fever, treated with empiric antibiotics for pneumonia, completed 5day course -all cultures negative -CT chest without convincing evidence for pneumonia, cultures negative -stable, continue oral Lasix  3. Pleural effusions/edema - likely secondary to liver cirrhosis, worsened by hypoalbuminemia from Malignancy -diuresed with IV Lasix, getting close to euvolemic at this time -2-D echocardiogram with EF of 50-55%, unable to assess diastolic parameters -continue Lasix 40 mg  twice a day with potassium -improved and stable  4. Liver cirrhosis - pt has a heavy alcohol history when he was younger, he reports only drinking wine occasionally now.        -Lasix twice a day at DC, hepatitis serology negative  5. Generalized weakness  - multifactorial -improving with diuresis -PT eval completed, home health PT recommended -home health services set up, family concerned about need for more help, case manager consult requested  6. Cognitive decline - Family reports progressive symptoms of memory loss for past several months.  He needs outpatient work up for this.  - B12, TSH -unremarkable  7. Acne rosacea - stable.    Procedures: Right thoracentesis 8/21 with 680 mL of clear fluid drained  Consults -Oncology   Discharge Exam: Vitals:   09/22/17 0357 09/22/17 0929  BP: (!) 144/54 (!) 125/43  Pulse: 66 (!) 59  Resp: 20 18  Temp:  97.9 F (36.6 C)  SpO2: 99% 99%    General: AAOx3 Cardiovascular: S1S2/RRR Respiratory: CTAB  Discharge Instructions   Discharge Instructions    Diet - low sodium heart healthy   Complete by:  As directed    Increase activity slowly   Complete by:  As directed      Allergies as of 09/22/2017      Reactions   Oysters [shellfish Allergy]    Doxycycline Rash   Hydroxyzine Hcl Rash      Medication List    TAKE these medications   feeding supplement (ENSURE ENLIVE) Liqd Take 237 mLs by mouth 2 (two) times daily between meals.   furosemide 40 MG tablet Commonly known as:  LASIX Take 1 tablet (40 mg total) by mouth 2 (two) times daily. What changed:  when to  take this   potassium chloride SA 20 MEQ tablet Commonly known as:  K-DUR,KLOR-CON Take 2 tablets (40 mEq total) by mouth daily.   triamcinolone cream 0.1 % Commonly known as:  KENALOG Apply 1 application topically 2 (two) times daily.   vitamin C 100 MG tablet Take 300 mg by mouth daily.            Durable Medical Equipment  (From  admission, onward)         Start     Ordered   09/21/17 1119  For home use only DME 3 n 1  Once     09/21/17 1119   09/21/17 1119  For home use only DME Walker rolling  Once    Question:  Patient needs a walker to treat with the following condition  Answer:  Debility   09/21/17 1119         Allergies  Allergen Reactions  . Oysters [Shellfish Allergy]   . Doxycycline Rash  . Hydroxyzine Hcl Rash   Follow-up Information    PCP. Schedule an appointment as soon as possible for a visit in 1 week(s).   Why:  need labs-Bmet at Serita Butcher, MD. Go in 1 week(s).   Specialties:  Hematology, Oncology Why:  Office will call you with appointment Contact information: [place] [address] [phone]        Care, [facility] Follow up.   Specialty:  [place] Why:  HHPT/OT/aide arranged- they will call you to set up sevices for home visits Contact information: [place] [address] [phone]            The results of significant diagnostics from this hospitalization (including imaging, microbiology, ancillary and laboratory) are listed below for reference.    Significant Diagnostic Studies: Dg Chest 1 View  Result Date: 09/18/2017 CLINICAL DATA:  Post right thoracentesis EXAM: CHEST  1 VIEW COMPARISON:  09/16/2017 FINDINGS: Layering left pleural effusion. Left lower lobe atelectasis or infiltrate. Heart is normal size. Right lung clear. No acute bony abnormality. IMPRESSION: Layering left pleural effusion with left lower lobe atelectasis or infiltrate. Electronically Signed   By: Rolm Baptise M.D.   On: 09/18/2017 10:54   Ct Head Wo Contrast  Result Date: 09/10/2017 CLINICAL DATA:  Altered level of consciousness and gait disturbance EXAM: CT HEAD WITHOUT CONTRAST TECHNIQUE: Contiguous axial images were obtained from the base of the skull through the vertex without intravenous contrast.  COMPARISON:  None. FINDINGS: Brain: There is mild to moderate diffuse atrophy. There is no intracranial mass, hemorrhage, extra-axial fluid collection or midline shift. There is patchy small vessel disease in the centra semiovale bilaterally. Elsewhere gray-white compartments appear normal. No evident acute infarct. Vascular: No hyperdense vessels. There is calcification in each cavernous carotid artery region. Skull: The bony calvarium appears intact. Sinuses/Orbits: There is mucosal thickening in the inferior left maxillary antrum. There is mucosal thickening in several ethmoid air cells. There is a retention cyst in the anterior left sphenoid sinus. Orbits appear symmetric bilaterally. Other: Mastoid air cells are clear. 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 No acute infarct evident. No mass or hemorrhage. Mild arterial vascular calcification noted. Multiple foci of paranasal sinus disease. Electronically Signed   By: Lowella Grip III M.D.   On: 09/10/2017 18:55   Ct Chest W Contrast  Result Date: 09/17/2017 CLINICAL DATA:  77 year old male with history of  abdominal pain. Cirrhosis. Known hepatic mass. Sepsis with pneumonia. Remote history of prostate cancer status post prostatectomy. EXAM: CT CHEST, ABDOMEN, AND PELVIS WITH CONTRAST TECHNIQUE: Multidetector CT imaging of the chest, abdomen and pelvis was performed following the standard protocol during bolus administration of intravenous contrast. CONTRAST:  189mL ISOVUE-300 IOPAMIDOL (ISOVUE-300) INJECTION 61%, <See Chart> ISOVUE-300 IOPAMIDOL (ISOVUE-300) INJECTION 61% COMPARISON:  None. FINDINGS: CT CHEST FINDINGS Cardiovascular: Heart size is mildly enlarged. There is no significant pericardial fluid, thickening or pericardial calcification. There is aortic atherosclerosis, as well as atherosclerosis of the great vessels of the mediastinum and the coronary arteries, including calcified atherosclerotic plaque in the  left main, left anterior descending, left circumflex and right coronary arteries. Severe thickening calcifications of the aortic valve. Calcifications of the mitral annulus. Mediastinum/Nodes: No pathologically enlarged mediastinal or hilar lymph nodes. Esophagus is unremarkable in appearance. No axillary lymphadenopathy. Lungs/Pleura: Moderate to large bilateral pleural effusions lying dependently. This is associated with areas of passive atelectasis in the lower lobes of both lungs. No consolidative airspace disease. In the posterior aspect of the left upper lobe (axial image 53 of series 4) there is a 1.3 x 0.7 x 1.3 cm nodular area of architectural distortion. A few patchy areas of ground-glass attenuation are also noted in the lungs, most evident in the left upper lobe on axial image 49 of series 4. Musculoskeletal: There are no aggressive appearing lytic or blastic lesions noted in the visualized portions of the skeleton. CT ABDOMEN PELVIS FINDINGS Hepatobiliary: In the right lobe of the liver there is a very large heterogeneously enhancing mass which appears bilobed, and overall measures 7.5 x 10.0 x 12.7 cm (axial image 63 of series 3 and coronal image 84 of series 6). Liver has a shrunken appearance and nodular contour, indicative of cirrhosis. No intra or extrahepatic biliary ductal dilatation. 7 mm calcified gallstone lying dependently in the gallbladder. Pancreas: No pancreatic mass. No pancreatic ductal dilatation. No pancreatic or peripancreatic fluid or inflammatory changes. Spleen: Unremarkable. Adrenals/Urinary Tract: Multiple well-defined low-attenuation nonenhancing lesions in both kidneys are compatible with simple cysts, largest of which measures 3.9 cm in the interpolar region of the left kidney. No hydroureteronephrosis. In the dependent portion of the urinary bladder there is a 7 mm calculus. Urinary bladder is otherwise unremarkable. Stomach/Bowel: Normal appearance of the stomach. No  pathologic dilatation of small bowel or colon. Several colonic diverticulae are noted. Appendix is not confidently identified and may be surgically absent. Vascular/Lymphatic: Aortic atherosclerosis, without evidence of aneurysm or dissection in the abdominal or pelvic vasculature. Portal vein is dilated measuring 16 mm in the porta hepatis. Intrahepatic branches of the portal vein are diminutive. Numerous portosystemic collateral pathways are noted, most evident by a large left splenorenal shunt. Borderline enlarged and mildly enlarged aortocaval lymph nodes noted, measuring up to 11 mm in short axis (axial image 76 of series 3). Reproductive: Status post radical prostatectomy. Other: Small to moderate volume of ascites.  No pneumoperitoneum. Musculoskeletal: There are no aggressive appearing lytic or blastic lesions noted in the visualized portions of the skeleton. IMPRESSION: 1. Large malignant-appearing mass in the right lobe of the liver measuring 7.5 x 10.0 x 12.7 cm. Given the underlying cirrhosis, this presumably reflects a primary hepatocellular carcinoma. 2. 1.3 x 0.7 x 1.3 cm nodule in the left upper lobe posteriorly. The possibility of a metastatic lesion should be considered, however, this may alternatively reflect an infectious or inflammatory nodule. Close attention on future follow-up imaging is recommended. 3. Small  to moderate volume of ascites. 4. Moderate to large bilateral pleural effusions lying dependently with associated passive subsegmental atelectasis in the lower lobes of the lungs bilaterally. 5. Mild cardiomegaly. 6. Aortic atherosclerosis, in addition to left main and 3 vessel coronary artery disease. Assessment for potential risk factor modification, dietary therapy or pharmacologic therapy may be warranted, if clinically indicated. 7. There are calcifications of the aortic valve and mitral annulus. Echocardiographic correlation for evaluation of potential valvular dysfunction may be  warranted if clinically indicated. 8. Colonic diverticulosis. 9. Additional incidental findings, as above. Electronically Signed   By: Vinnie Langton M.D.   On: 09/17/2017 15:07   Ct Abdomen Pelvis W Contrast  Result Date: 09/17/2017 CLINICAL DATA:  77 year old male with history of abdominal pain. Cirrhosis. Known hepatic mass. Sepsis with pneumonia. Remote history of prostate cancer status post prostatectomy. EXAM: CT CHEST, ABDOMEN, AND PELVIS WITH CONTRAST TECHNIQUE: Multidetector CT imaging of the chest, abdomen and pelvis was performed following the standard protocol during bolus administration of intravenous contrast. CONTRAST:  123mL ISOVUE-300 IOPAMIDOL (ISOVUE-300) INJECTION 61%, <See Chart> ISOVUE-300 IOPAMIDOL (ISOVUE-300) INJECTION 61% COMPARISON:  None. FINDINGS: CT CHEST FINDINGS Cardiovascular: Heart size is mildly enlarged. There is no significant pericardial fluid, thickening or pericardial calcification. There is aortic atherosclerosis, as well as atherosclerosis of the great vessels of the mediastinum and the coronary arteries, including calcified atherosclerotic plaque in the left main, left anterior descending, left circumflex and right coronary arteries. Severe thickening calcifications of the aortic valve. Calcifications of the mitral annulus. Mediastinum/Nodes: No pathologically enlarged mediastinal or hilar lymph nodes. Esophagus is unremarkable in appearance. No axillary lymphadenopathy. Lungs/Pleura: Moderate to large bilateral pleural effusions lying dependently. This is associated with areas of passive atelectasis in the lower lobes of both lungs. No consolidative airspace disease. In the posterior aspect of the left upper lobe (axial image 53 of series 4) there is a 1.3 x 0.7 x 1.3 cm nodular area of architectural distortion. A few patchy areas of ground-glass attenuation are also noted in the lungs, most evident in the left upper lobe on axial image 49 of series 4.  Musculoskeletal: There are no aggressive appearing lytic or blastic lesions noted in the visualized portions of the skeleton. CT ABDOMEN PELVIS FINDINGS Hepatobiliary: In the right lobe of the liver there is a very large heterogeneously enhancing mass which appears bilobed, and overall measures 7.5 x 10.0 x 12.7 cm (axial image 63 of series 3 and coronal image 84 of series 6). Liver has a shrunken appearance and nodular contour, indicative of cirrhosis. No intra or extrahepatic biliary ductal dilatation. 7 mm calcified gallstone lying dependently in the gallbladder. Pancreas: No pancreatic mass. No pancreatic ductal dilatation. No pancreatic or peripancreatic fluid or inflammatory changes. Spleen: Unremarkable. Adrenals/Urinary Tract: Multiple well-defined low-attenuation nonenhancing lesions in both kidneys are compatible with simple cysts, largest of which measures 3.9 cm in the interpolar region of the left kidney. No hydroureteronephrosis. In the dependent portion of the urinary bladder there is a 7 mm calculus. Urinary bladder is otherwise unremarkable. Stomach/Bowel: Normal appearance of the stomach. No pathologic dilatation of small bowel or colon. Several colonic diverticulae are noted. Appendix is not confidently identified and may be surgically absent. Vascular/Lymphatic: Aortic atherosclerosis, without evidence of aneurysm or dissection in the abdominal or pelvic vasculature. Portal vein is dilated measuring 16 mm in the porta hepatis. Intrahepatic branches of the portal vein are diminutive. Numerous portosystemic collateral pathways are noted, most evident by a large left splenorenal shunt. Borderline  enlarged and mildly enlarged aortocaval lymph nodes noted, measuring up to 11 mm in short axis (axial image 76 of series 3). Reproductive: Status post radical prostatectomy. Other: Small to moderate volume of ascites.  No pneumoperitoneum. Musculoskeletal: There are no aggressive appearing lytic or blastic  lesions noted in the visualized portions of the skeleton. IMPRESSION: 1. Large malignant-appearing mass in the right lobe of the liver measuring 7.5 x 10.0 x 12.7 cm. Given the underlying cirrhosis, this presumably reflects a primary hepatocellular carcinoma. 2. 1.3 x 0.7 x 1.3 cm nodule in the left upper lobe posteriorly. The possibility of a metastatic lesion should be considered, however, this may alternatively reflect an infectious or inflammatory nodule. Close attention on future follow-up imaging is recommended. 3. Small to moderate volume of ascites. 4. Moderate to large bilateral pleural effusions lying dependently with associated passive subsegmental atelectasis in the lower lobes of the lungs bilaterally. 5. Mild cardiomegaly. 6. Aortic atherosclerosis, in addition to left main and 3 vessel coronary artery disease. Assessment for potential risk factor modification, dietary therapy or pharmacologic therapy may be warranted, if clinically indicated. 7. There are calcifications of the aortic valve and mitral annulus. Echocardiographic correlation for evaluation of potential valvular dysfunction may be warranted if clinically indicated. 8. Colonic diverticulosis. 9. Additional incidental findings, as above. Electronically Signed   By: Vinnie Langton M.D.   On: 09/17/2017 15:07   Mr Liver W Wo Contrast  Result Date: 09/20/2017 CLINICAL DATA:  Known liver masses suspicious for hepatocellular carcinoma. Increasing weakness and fevers. Recent thoracentesis. EXAM: MRI ABDOMEN WITHOUT AND WITH CONTRAST TECHNIQUE: Multiplanar multisequence MR imaging of the abdomen was performed both before and after the administration of intravenous contrast. CONTRAST:  13mL MULTIHANCE GADOBENATE DIMEGLUMINE 529 MG/ML IV SOLN COMPARISON:  Abdominal ultrasound 09/16/2017. CT of the chest, abdomen and pelvis 09/17/2017. FINDINGS: Lower chest: The right pleural effusion appears decreased in volume. There are persistent  left-greater-than-right pleural effusions with associated bibasilar atelectasis. The heart is enlarged. Hepatobiliary: Underlying morphologic changes of cirrhosis. Again demonstrated is a very large mass occupying most of the right hepatic lobe. This measures at least 13.0 x 9.2 cm transverse (image 16/6) and 12.8 cm cephalocaudad (image 14/4). This has an exophytic component superiorly. There is extension into the left hepatic lobe, probably within the portal vein, best seen on the T2 weighted images. Tumor also extends into the middle hepatic vein and IVC. The lesion demonstrates heterogeneous T2 hyperintensity, arterial phase enhancement and mild delayed washout, consistent with hepatocellular carcinoma. Cholelithiasis without gallbladder wall thickening or significant biliary dilatation. Pancreas: Unremarkable. No pancreatic ductal dilatation or surrounding inflammatory changes. Spleen: Normal in size without focal abnormality. Adrenals/Urinary Tract: Both adrenal glands appear normal. Bilateral renal cysts. No evidence of enhancing renal mass or hydronephrosis. Stomach/Bowel: No evidence of bowel wall thickening, distention or surrounding inflammatory change. Vascular/Lymphatic: There are small lymph nodes within the porta hepatis and retroperitoneum, including an 8 mm aortocaval node on image 74/1002. Aortic and branch vessel atherosclerosis, better seen on CT. There are large portosystemic collateral vessels in the left abdomen, inferior to the spleen related to a splenorenal shunt. Other: There is generalized soft tissue edema throughout the subcutaneous and intra-abdominal fat. A small to moderate amount of ascites is present. Musculoskeletal: No acute or significant osseous findings. IMPRESSION: 1. Again demonstrated is a large hepatic mass with intravascular invasion involving the hepatic veins, IVC and portal vein. This remains most consistent with hepatocellular carcinoma. 2. Generalized soft tissue  edema with ascites and  bilateral pleural effusions. Right pleural effusion has decreased in volume from previous CT. 3. Underlying hepatic cirrhosis with multiple portosystemic collaterals consistent with portal hypertension. Electronically Signed   By: Richardean Sale M.D.   On: 09/20/2017 12:27   Dg Chest Port 1 View  Result Date: 09/16/2017 CLINICAL DATA:  Generalized weakness. EXAM: PORTABLE CHEST 1 VIEW COMPARISON:  07/06/2009 FINDINGS: Artifact overlies the chest. The heart is mildly enlarged. There is aortic atherosclerosis. There is abnormal density in the mid and lower lungs bilaterally. I think this could either be due to congestive heart failure with early pulmonary edema or bilateral pneumonia left worse than right. No significant bone finding. IMPRESSION: Abnormal radiography with interstitial and alveolar density that could be either congestive heart failure/pulmonary edema or infectious pneumonia. Electronically Signed   By: Nelson Chimes M.D.   On: 09/16/2017 16:50   US Abdomen Limited Ruq  Result Date: 09/16/2017 CLINICAL DATA:  Elevated liver enzymes EXAM: ULTRASOUND ABDOMEN LIMITED RIGHT UPPER QUADRANT COMPARISON:  None. FINDINGS: Gallbladder: Stones and sludge are present within the gallbladder, largest measured stone at 8 mm. There is no gallbladder wall thickening. No sonographic Murphy's sign elicited. Common bile duct: Diameter: 4 mm. Liver: Diffusely heterogeneous parenchyma suggesting cirrhosis and/or fatty infiltration. Multiple masses are identified within the liver, the 2 largest are both located within the RIGHT liver lobe measuring 8 cm and 7.3 cm greatest dimension respectively. Portal vein is patent on color Doppler imaging with normal direction of blood flow towards the liver. Small amount of free fluid is seen within the RIGHT upper quadrant. IMPRESSION: 1. At least 2 large hepatic masses, both located within the RIGHT liver lobe, measuring 8 cm and 7.3 cm respectively.  These are highly suspicious for neoplastic masses. If/when patient is clinically stable and able to follow directions and hold their breath (preferably as an outpatient) further evaluation with dedicated liver MRI should be considered. 2. Suspect underlying liver cirrhosis. 3. Small amount of ascites within the RIGHT upper quadrant. 4. Cholelithiasis without convincing evidence of acute cholecystitis. No bile duct dilatation. Electronically Signed   By: Franki Cabot M.D.   On: 09/16/2017 19:26   US Thoracentesis Asp Pleural Space W/img Guide  Result Date: 09/18/2017 INDICATION: Bilateral asymptomatic pleural effusions - recent history of sepsis due to PNA; worsening BLE edema, hepatic masses on recent CT. Request for diagnostic thoracentesis. EXAM: ULTRASOUND GUIDED RIGHT THORACENTESIS MEDICATIONS: 10 mL 1% lidocaine. COMPLICATIONS: None immediate. PROCEDURE: An ultrasound guided thoracentesis was thoroughly discussed with the patient and questions answered. The benefits, risks, alternatives and complications were also discussed. The patient understands and wishes to proceed with the procedure. Written consent was obtained. Ultrasound was performed to localize and mark an adequate pocket of fluid in the right chest. The area was then prepped and draped in the normal sterile fashion. 1% Lidocaine was used for local anesthesia. Under ultrasound guidance a 6 Fr Safe-T-Centesis catheter was introduced. Thoracentesis was performed. The catheter was removed and a dressing applied. FINDINGS: A total of approximately 680 mL of clear yellow fluid was removed. Samples were sent to the laboratory as requested by the clinical team. IMPRESSION: Successful ultrasound guided right thoracentesis yielding 680 mL of pleural fluid. Read by Candiss Norse, PA-C Electronically Signed   By: Jacqulynn Cadet M.D.   On: 09/18/2017 11:04    Microbiology: Recent Results (from the past 240 hour(s))  Blood Culture (routine x 2)      Status: None   Collection Time: 09/16/17  3:58  PM  Result Value Ref Range Status   Specimen Description BLOOD LEFT ANTECUBITAL  Final   Special Requests   Final    BOTTLES DRAWN AEROBIC AND ANAEROBIC Blood Culture results may not be optimal due to an inadequate volume of blood received in culture bottles   Culture   Final    NO GROWTH 5 DAYS Performed at [facility] [address], [place], [place] [address]    Report Status 09/21/2017 FINAL  Final  Blood Culture (routine x 2)     Status: None   Collection Time: 09/16/17  4:16 PM  Result Value Ref Range Status   Specimen Description BLOOD RIGHT ANTECUBITAL  Final   Special Requests   Final    BOTTLES DRAWN AEROBIC AND ANAEROBIC Blood Culture results may not be optimal due to an inadequate volume of blood received in culture bottles   Culture   Final    NO GROWTH 5 DAYS Performed at [facility], [place] [facility] [address] [place], [place] [address]    Report Status 09/21/2017 FINAL  Final  Culture, body fluid-bottle     Status: None (Preliminary result)   Collection Time: 09/18/17 10:52 AM  Result Value Ref Range Status   Specimen Description PLEURAL RIGHT  Final   Special Requests NONE  Final   Culture   Final    NO GROWTH 3 DAYS Performed at [facility] [address] [place], [place] [address]    Report Status PENDING  Incomplete  Gram stain     Status: None   Collection Time: 09/18/17 10:52 AM  Result Value Ref Range Status   Specimen Description PLEURAL RIGHT  Final   Special Requests NONE  Final   Gram Stain   Final    FEW WBC PRESENT, PREDOMINANTLY MONONUCLEAR NO ORGANISMS SEEN Performed at [facility], [address] [employer], [place] [address]    Report Status 09/18/2017 FINAL  Final     Labs: Basic Metabolic Panel: Recent Labs  Lab 09/18/17 0425 09/19/17 0651 09/20/17 0656 09/21/17 0604 09/22/17 0405  NA 135 138 138 138 136  K 3.5 4.0 4.3 4.5 4.1  CL 103 102 106 105 103  CO2 27  30 25 26 27   GLUCOSE 85 95 84 82 103*  BUN 11 12 14 19 16   CREATININE 0.82 0.80 0.69 0.72 0.67  CALCIUM 7.9* 8.4* 8.6* 8.8* 8.6*  MG 1.9  --   --   --   --    Liver Function Tests: Recent Labs  Lab 09/16/17 1558 09/17/17 0503 09/20/17 0656  AST 106* 91* 97*  ALT 122* 104* 118*  ALKPHOS 158* 140* 142*  BILITOT 2.0* 1.9* 1.6*  PROT 6.0* 5.5* 5.8*  ALBUMIN 2.4* 2.2* 2.1*   Recent Labs  Lab 09/16/17 1558  LIPASE 68*   Recent Labs  Lab 09/16/17 1558  AMMONIA 20   CBC: Recent Labs  Lab 09/16/17 1558  09/17/17 0503 09/18/17 0425 09/19/17 0651 09/20/17 0656 09/21/17 0604  WBC 7.3   < > 5.6 6.0 5.8 6.3 6.3  NEUTROABS 5.9  --   --   --   --   --   --   HGB 13.5   < > 13.5 13.0 14.5 14.7 14.1  HCT 40.4   < > 39.7 38.0* 42.4 43.3 41.8  MCV 103.1*   < > 101.8* 99.5 100.2* 101.2* 102.0*  PLT 157   < > 151 155 182 176 152   < > =  values in this interval not displayed.   Cardiac Enzymes: No results for input(s): CKTOTAL, CKMB, CKMBINDEX, TROPONINI in the last 168 hours. BNP: BNP (last 3 results) Recent Labs    09/16/17 1558  BNP 713.9*    ProBNP (last 3 results) No results for input(s): PROBNP in the last 8760 hours.  CBG: Recent Labs  Lab 09/16/17 1553  GLUCAP 106*       Signed:  Domenic Polite MD.  [facility] 09/22/2017, 2:38 PM

## 2017-09-22 NOTE — Care Management Note (Signed)
Case Management Note Previous CM note completed by Claudie Leach RN, CM  Patient Details  Name: Devin Reeves MRN: [id number] Date of Birth: 1940/05/09  Subjective/Objective:                Pt presented for weakness and fevers from home with wife.  Pt newly diagnosed with hepatocarcinoma.  Pt to return home with 3n1, RW and HH PT, OT, NA.   Pt and family want to set up personal care services.  Discussed options at length and role of HH.  Pt lives with wife, who may not be able to lift him. His daughter is visiting from [place].  They plan to arrange PCS ASAP and for family to stay until then.  They wanted to explore SNF options but patient has been walking well with PT and staff.  Daughter states patient gets anxious when family tries to help him.  [facility].   Action/Plan: Discussed DME needs with patient and family.  RW and 3n1 ordered from [place] with [facility].    Family took [facility] list to explore options and will f/u with CM tomorrow prior to d/c.   Expected Discharge Date:  09/22/17               Expected Discharge Plan:  [place]  In-House Referral:  NA  Discharge planning Services  CM Consult  Post Acute Care Choice:  Home Health, Durable Medical Equipment Choice offered to:  Patient, Adult Children, Spouse  DME Arranged:  3-N-1, [place] DME Agency:  [place]:  PT, OT, Nurse's Aide [place] Agency:  [place]  Status of Service:  Completed, signed off  If discussed at [place] of Stay Meetings, dates discussed:    Discharge Disposition: home/home health   Additional Comments:  09/22/17- 1400- Nalleli Largent RN, CM- pt for transition to home today- f/u done with pt's wife and daughter for [facility] agency of choice- per wife they would like to use [facility] for [facility] needs- they also plan to set up Private Duty with [facility]. Pt to return home with family- call made to [facility] with [facility] for [facility] referral=  referral has been accepted.   Dawayne Patricia, RN 09/22/2017, 1:59 PM CM weekend coverage for 40M [phone]

## 2017-09-23 LAB — CULTURE, BODY FLUID W GRAM STAIN -BOTTLE

## 2017-09-23 LAB — CULTURE, BODY FLUID-BOTTLE: CULTURE: NO GROWTH

## 2017-09-24 ENCOUNTER — Telehealth: Payer: Self-pay | Admitting: Hematology

## 2017-09-24 ENCOUNTER — Telehealth: Payer: Self-pay

## 2017-09-24 NOTE — Telephone Encounter (Signed)
Patient's wife calls regarding concerns over the color of his urine (dark) and frequency at night. The need for refill on Lasix and potassium.   I explained to her to contact his PCP in the interim until can be seen by Dr. Burr Medico next week.  She verbalized an understanding and will contact PCP

## 2017-09-24 NOTE — Telephone Encounter (Signed)
Scheduled appt per 8/27 sch message - left message for patient with appt date and time and sent reminder letter in the mail

## 2017-09-30 DIAGNOSIS — C220 Liver cell carcinoma: Secondary | ICD-10-CM | POA: Insufficient documentation

## 2017-09-30 NOTE — Progress Notes (Signed)
Devin Reeves  [phone] [phone]  Clinic Follow up Note   Patient Care Team: System, Pcp Not In as PCP - General 10/01/2017  REFERRAL PHYSICIAN: Dr. Alen Blew   Chief complaint: Newly diagnosed Lapel   HISTORY OF PRESENTING ILLNESS (According to Dr. Alen Blew on 09/20/2017) I was asked by Dr. Broadus John  to evaluate Devin Reeves for new finding of liver mass in the setting of cirrhosis of the liver.  He is a 77 year old gentleman native of [place] currently resides in this area with his wife.  His children predominate live in [place].  He has a history of hypertension as well as heavy alcohol use in the past who was hospitalized on 09/16/2017 with complaints of fevers and bilateral pulmonary infiltrates as well as effusion.  He was treated empirically for possible pneumonia and presumed sepsis.  He underwent thoracentesis on the right side of the lung which yielded 680 cc with a cytology did not reveal any malignancy.  Imaging study of the chest abdomen and pelvis obtained on 09/17/2017 showed a large hepatic mass measuring 7.5 x 10.0 x 12.7 cm with cirrhosis findings associated with it.  There is a 1.3 x 0.7 x 1.3 cm nodule area of the left upper lobe of the lung was also noted.  Moderate to large bilateral effusion were noted as well.  His alpha-fetoprotein was elevated at 78.6.  MRI of the liver obtained on 09/20/2017 showed the same large hepatic mass with intravascular invasion into the hepatic veins, IVC and portal vein which is consistent with hepatocellular carcinoma.  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  Clinically, he is still quite debilitated although he is eating slightly better.  He still overall weak and reporting his respiratory symptoms slightly improved.  He still has intermittent fevers.  He denies any abdominal discomfort.  He does not report any headaches, blurry vision, syncope or seizures.  Memory issues has been  reported by his family.  Does not report any fevers, chills or sweats. Does not report any cough, wheezing or hemoptysis. Does not report any chest pain, palpitation, orthopnea or leg edema. Does not report any nausea, vomiting or abdominal pain. Does not report any constipation or diarrhea. Does not report any skeletal complaints.   Does not report frequency, urgency or hematuria.  Does not report any skin rashes or lesions. Does not report any heat or cold intolerance.  Does not report any lymphadenopathy or petechiae.  Remaining review of systems is negative.   CURRENT THERAPY: pending    INTERVAL HISTORY:  Devin Reeves is a 77 y.o. male who is here for follow-up. He was hospitalized on 09/16/2017 for weakness and fever, and was treated empirically for possible pneumonia and presumed sepsis. Abdominal US showed multiple liver lesions suspicious of malignancy and Dr. Alen Blew was consulted. Patient was discharged on 09/22/2017. Today, he is here with his family members. His wife states that he had bilateral leg swelling and breathing difficulty before his recent hospitalization and this swelling is worsening and now involves his abdomen. He states that he knew he had liver disease before and was advised by his PCP  to stop drinking 2 months ago. He went to see his PCP due to drinking problems. He denies being diagnosed with liver cirrhosis. He's been drinking his whole life, but quit now. He used to drink 2 glasses of wine a day and used to drink liquor years ago. He retired almost 10 years ago. His wife states that  he is getting more confused and is becoming more physically slow now. He now has someone with him all the time to help him with his daily activities. He states having more watery stools recently. No constipation.   He smoked on and off for almost 20 years almost 2 packs a year, but quit 20 years ago.  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 He is following up with his  urologist for this.    REVIEW OF SYSTEMS:   Constitutional: Denies fevers, chills or abnormal weight loss (+) wheelchair (+) confsion Eyes: Denies blurriness of vision Ears, nose, mouth, throat, and face: Denies mucositis or sore throat Respiratory: Denies cough or wheezes (+) SOB Cardiovascular: Denies palpitation, chest discomfort (+) lower extremity swelling up to his groin (+) compression stocking  Gastrointestinal:  Denies nausea, heartburn or change in bowel habits (+) asitics (+) recent watery stools  Skin: Denies abnormal skin rashes (+) multiple skin bruises  Lymphatics: Denies new lymphadenopathy or easy bruising Neurological:Denies numbness, tingling or new weaknesses Behavioral/Psych: Mood is stable, no new changes  All other systems were reviewed with the patient and are negative.  MEDICAL HISTORY:  Past Medical History:  Diagnosis Date  . Acne rosacea   . Hyperlipidemia   . Hypertension   . Liver cirrhosis, alcoholic ([place])   . Nocturia   . Skin cancer     SURGICAL HISTORY: Past Surgical History:  Procedure Laterality Date  . prostectomy  2012  . TONSILLECTOMY      I have reviewed the social history and family history with the patient and they are unchanged from previous note.  ALLERGIES:  is allergic to oysters [shellfish allergy]; doxycycline; and hydroxyzine hcl.  MEDICATIONS:  Current Outpatient Medications  Medication Sig Dispense Refill  . Ascorbic Acid (VITAMIN C) 100 MG tablet Take 300 mg by mouth daily.    . feeding supplement, ENSURE ENLIVE, (ENSURE ENLIVE) LIQD Take 237 mLs by mouth 2 (two) times daily between meals. 237 mL 2  . furosemide (LASIX) 40 MG tablet Take 1 tablet (40 mg total) by mouth 2 (two) times daily. 60 tablet 0  . potassium chloride SA (K-DUR,KLOR-CON) 20 MEQ tablet Take 2 tablets (40 mEq total) by mouth daily. 30 tablet 0  . triamcinolone cream (KENALOG) 0.1 % Apply 1 application topically 2 (two) times daily.     No current  facility-administered medications for this visit.     PHYSICAL EXAMINATION:  ECOG PERFORMANCE STATUS: 3 - Symptomatic, >50% confined to bed  Vitals:   10/01/17 1046  BP: (!) 145/61  Pulse: 67  Resp: 17  Temp: 98.4 F (36.9 C)  SpO2: 100%   Filed Weights   10/01/17 1046  Weight: 155 lb 1.6 oz (70.4 kg)    GENERAL:alert, no distress and comfortable SKIN: skin color, texture, turgor are normal, no rashes or significant lesions (+) multiple skin bruises  EYES: normal, Conjunctiva are pink and non-injected, sclera clear OROPHARYNX:no exudate, no erythema and lips, buccal mucosa, and tongue normal  NECK: supple, thyroid normal size, non-tender, without nodularity LYMPH:  no palpable lymphadenopathy in the cervical, axillary or inguinal LUNGS: clear to auscultation and percussion with normal breathing effort (+) diminished air entry at lung bases likely due to pleural effusion  HEART: regular rate & rhythm and no lower extremity edema (+) heart murmur ABDOMEN:abdomen soft, non-tender and normal bowel sounds (+) liver 10 cm below costal margin (+) distended abdomen  Musculoskeletal:no cyanosis of digits and no clubbing  NEURO: alert & oriented x 3 with fluent speech, no focal motor/sensory deficits Exam was performed on a wheelchair today.  LABORATORY DATA:  I have reviewed the data as listed CBC Latest Ref Rng & Units 10/01/2017 09/21/2017 09/20/2017  WBC 4.0 - 10.3 K/uL 6.8 6.3 6.3  Hemoglobin 13.0 - 17.1 g/dL 13.0 14.1 14.7  Hematocrit 38.4 - 49.9 % 38.8 41.8 43.3  Platelets 140 - 400 K/uL 194 152 176     CMP Latest Ref Rng & Units 10/01/2017 09/22/2017 09/21/2017  Glucose 70 - 99 mg/dL 121(H) 103(H) 82  BUN 8 - 23 mg/dL 19 16 19   Creatinine 0.61 - 1.24 mg/dL 0.76 0.67 0.72  Sodium 135 - 145 mmol/L 135 136 138  Potassium 3.5 - 5.1 mmol/L 4.3 4.1 4.5  Chloride 98 - 111 mmol/L 100 103 105  CO2 22 - 32 mmol/L 29 27 26   Calcium 8.9 - 10.3 mg/dL 9.1 8.6(L) 8.8(L)  Total Protein 6.5  - 8.1 g/dL 6.8 - -  Total Bilirubin 0.3 - 1.2 mg/dL 1.5(H) - -  Alkaline Phos 38 - 126 U/L 266(H) - -  AST 15 - 41 U/L 109(H) - -  ALT 0 - 44 U/L 146(H) - -   Tumor Markers AFP Results for Devin Reeves, Devin Reeves (MRN [id number]) as of 09/30/2017 11:42  Ref. Range 09/18/2017 14:34  AFP, Serum, Tumor Marker Latest Ref Range: 0.0 - 8.3 ng/mL 78.6 (H)    Procedures  09/18/2017 Thoracocentesis  IMPRESSION: Successful ultrasound guided right thoracentesis yielding 680 mL of pleural fluid.   Pathology 09/18/2017 Cytology Diagnosis PLEURAL FLUID, RIGHT (SPECIMEN 1 OF 1, COLLECTED ON 09/18/17): NO MALIGNANT CELLS IDENTIFIED.    RADIOGRAPHIC STUDIES: I have personally reviewed the radiological images as listed and agreed with the findings in the report.  09/20/2017 MRI Liver IMPRESSION: 1. Again demonstrated is a large hepatic mass with intravascular invasion involving the hepatic veins, IVC and portal vein. This remains most consistent with hepatocellular carcinoma. 2. Generalized soft tissue edema with ascites and bilateral pleural effusions. Right pleural effusion has decreased in volume from previous CT. 3. Underlying hepatic cirrhosis with multiple portosystemic collaterals consistent with portal hypertension.  09/17/2017 CT CAP IMPRESSION: 1. Large malignant-appearing mass in the right lobe of the liver measuring 7.5 x 10.0 x 12.7 cm. Given the underlying cirrhosis, this presumably reflects a primary hepatocellular carcinoma. 2. 1.3 x 0.7 x 1.3 cm nodule in the left upper lobe posteriorly. The possibility of a metastatic lesion should be considered, however, this may alternatively reflect an infectious or inflammatory nodule. Close attention on future follow-up imaging is recommended. 3. Small to moderate volume of ascites. 4. Moderate to large bilateral pleural effusions lying dependently with associated passive subsegmental atelectasis in the lower lobes of the lungs  bilaterally. 5. Mild cardiomegaly. 6. Aortic atherosclerosis, in addition to left main and 3 vessel coronary artery disease. Assessment for potential risk factor modification, dietary therapy or pharmacologic therapy may be warranted, if clinically indicated. 7. There are calcifications of the aortic valve and mitral annulus. Echocardiographic correlation for evaluation of potential valvular dysfunction may be warranted if clinically indicated. 8. Colonic diverticulosis. 9. Additional incidental findings, as above.  09/16/2017 Abdominal US IMPRESSION: 1. At least 2 large hepatic masses, both located within the RIGHT liver lobe, measuring 8 cm and 7.3 cm respectively. These are highly suspicious for neoplastic masses. If/when patient is clinically stable and able to follow directions and hold their breath (preferably as an outpatient) further evaluation with dedicated liver MRI  should be considered. 2. Suspect underlying liver cirrhosis. 3. Small amount of ascites within the RIGHT upper quadrant. 4. Cholelithiasis without convincing evidence of acute cholecystitis. No bile duct dilatation.   ASSESSMENT & PLAN:  Devin Reeves is a 77 y.o. male with history of HTN and HLD, heavy smoking and alcohol abuse history, presented with 2 months history of leg swelling, abdominal discomfort, weakness and intermittent confusion.  1. 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  Image showed evidence of liver cirrhosis with portal hypertension, and a large hepatic mass measuring 7.5 x 10.0 x 12.7 cm, a 1.3 x 0.7 x 1.3 cm with intravascular invasion typical image findings of hepatocellular carcinoma,, a nodule area of the left  upper lobe of the lung which is suspicious for metastasis.  I personally reviewed his image with patient and his family members in details.  -Given his typical image findings, and elevated AFP, he does not need tissue biopsy to confirm the diagnosis. -Hep C serology negative -We discussed that his liver is not curable at this stage, and the goal of therapy is palliative. -Given the probable lung metastasis, he is not a candidate for liver targeted therapy -Discussed systemic options, including oral TKIs such as sorafenib, lenvatinib, cabozantinib, regorafenib, immunotherapy, and the recently new data of atezolizumab and bevacizumab in [facility] which showed great response (up to 60-70% partial response). -Given his decompensated liver function, with child Pugh score 9 (class B), and overall very poor performance status, in may not be able to tolerate sorafenib or Lenvatinib  -I recommend him to start with atezo and beva if I am able to get his insurance to approve it, or get free drug replacement, due to higher response rate and overall good tolerance  -I discussed the potential side effect, which includes but not limited to, fatigue, autoimmune related disorders, pneumonitis, colitis, skin rash, hemorrhage, thrombosis, bowel perforation, hypertension, proteinuria, renal failure, liver dysfunction, etc. -The goal of therapy is palliative, to prolong his life and improve his quality of life. -We also discussed palliative care alone, due to his advanced age, poor performance status, and severely decompensated liver cirrhosis.  His overall prognosis is guarded, expected life expectancy is likely a few months to several months. -After lengthy discussion, patient and his family members would like him to try atezo and beva first  - I will refer to liver clinic NP Dawn Drazak to manage his liver cirrhosis -Lab, f/u with lacie and atezolizumab and bevacizumab in 2 weeks, or sooner if the drugs are approved earlier     2. Fever and recent pneumonia  -He initially presented with fever and bilateral effusion, treated with empiric antibiotics for pneumonia, completed 5day course -all cultures negative -No recurrent fever lately  3. Pleural effusions and leg edema - likely secondary to liver cirrhosis, worsened by hypoalbuminemia from Malignancy -2-D echocardiogram with EF of 50-55%, unable to assess diastolic parameters -continue Lasix 40 mg twice a day with potassium - Underwent thoracentesis on 8/21, which yielded 680 cc and cytology negative for malignancy -improved and stable  4. Liver cirrhosis, child Pugh class B,  likely related to alcohol, portal hypertension and metabolic encephalopathy  - pt has a heavy alcohol history when he was younger, he reports only drinking wine occasionally now.  -Lasix twice  a dayat DC, hepatitis serology negative -He is not constipated, has bowel movement 2-3 times a day, not on lactulose  5. Generalized weakness  - multifactorial -improving with diuresis -PT eval completed, home health PT recommended -home health services set up, family concerned about need for more help, case manager consult requested  6. Cognitive decline  - Family reports progressive symptoms of memory loss for past several months.  - B12, TSH unremarkable  7. Goal of care discussion  -We again discussed the incurable nature of his cancer, and the overall poor prognosis, especially if he does not have good response to systemic therapy -The patient understands the goal of care is palliative. -I recommend DNR/DNI, he will think about it    Plan -Make referral to liver clinic Dawn Drazak  -lab, f/u with lacie and atezolizumab and bevacizumab in 2 weeks    All questions were answered. The patient knows to call the clinic with any problems, questions or concerns. No barriers to learning was detected. I spent 45 minutes counseling the patient face to face. The total time spent in  the appointment was 60 minutes and more than 50% was on counseling and review of test results  I, Noor Dweik am acting as scribe for Dr. Truitt Merle.  I have reviewed the above documentation for accuracy and completeness, and I agree with the above.      Truitt Merle, MD 10/01/2017

## 2017-10-01 ENCOUNTER — Inpatient Hospital Stay: Payer: Medicare Other

## 2017-10-01 ENCOUNTER — Other Ambulatory Visit: Payer: Self-pay | Admitting: Hematology

## 2017-10-01 ENCOUNTER — Telehealth: Payer: Self-pay | Admitting: Hematology

## 2017-10-01 ENCOUNTER — Encounter: Payer: Self-pay | Admitting: Hematology

## 2017-10-01 ENCOUNTER — Inpatient Hospital Stay: Payer: Medicare Other | Attending: Hematology | Admitting: Hematology

## 2017-10-01 DIAGNOSIS — I10 Essential (primary) hypertension: Secondary | ICD-10-CM | POA: Diagnosis not present

## 2017-10-01 DIAGNOSIS — J90 Pleural effusion, not elsewhere classified: Secondary | ICD-10-CM

## 2017-10-01 DIAGNOSIS — Z79899 Other long term (current) drug therapy: Secondary | ICD-10-CM | POA: Diagnosis not present

## 2017-10-01 DIAGNOSIS — E785 Hyperlipidemia, unspecified: Secondary | ICD-10-CM

## 2017-10-01 DIAGNOSIS — Z66 Do not resuscitate: Secondary | ICD-10-CM | POA: Diagnosis not present

## 2017-10-01 DIAGNOSIS — C220 Liver cell carcinoma: Secondary | ICD-10-CM | POA: Diagnosis not present

## 2017-10-01 DIAGNOSIS — Z5112 Encounter for antineoplastic immunotherapy: Secondary | ICD-10-CM | POA: Insufficient documentation

## 2017-10-01 DIAGNOSIS — J189 Pneumonia, unspecified organism: Secondary | ICD-10-CM | POA: Diagnosis not present

## 2017-10-01 DIAGNOSIS — R509 Fever, unspecified: Secondary | ICD-10-CM

## 2017-10-01 DIAGNOSIS — R531 Weakness: Secondary | ICD-10-CM

## 2017-10-01 DIAGNOSIS — I2510 Atherosclerotic heart disease of native coronary artery without angina pectoris: Secondary | ICD-10-CM | POA: Diagnosis not present

## 2017-10-01 DIAGNOSIS — Z87891 Personal history of nicotine dependence: Secondary | ICD-10-CM

## 2017-10-01 DIAGNOSIS — Z7189 Other specified counseling: Secondary | ICD-10-CM

## 2017-10-01 DIAGNOSIS — K7031 Alcoholic cirrhosis of liver with ascites: Secondary | ICD-10-CM | POA: Diagnosis not present

## 2017-10-01 LAB — CMP (CANCER CENTER ONLY)
ALK PHOS: 266 U/L — AB (ref 38–126)
ALT: 146 U/L — ABNORMAL HIGH (ref 0–44)
AST: 109 U/L — AB (ref 15–41)
Albumin: 2.1 g/dL — ABNORMAL LOW (ref 3.5–5.0)
Anion gap: 6 (ref 5–15)
BILIRUBIN TOTAL: 1.5 mg/dL — AB (ref 0.3–1.2)
BUN: 19 mg/dL (ref 8–23)
CALCIUM: 9.1 mg/dL (ref 8.9–10.3)
CO2: 29 mmol/L (ref 22–32)
CREATININE: 0.76 mg/dL (ref 0.61–1.24)
Chloride: 100 mmol/L (ref 98–111)
GFR, Est AFR Am: >60 mL/min (ref >60)
GLUCOSE: 121 mg/dL — AB (ref 70–99)
Potassium: 4.3 mmol/L (ref 3.5–5.1)
Sodium: 135 mmol/L (ref 135–145)
Total Protein: 6.8 g/dL (ref 6.5–8.1)

## 2017-10-01 LAB — CBC WITH DIFFERENTIAL (CANCER CENTER ONLY)
BASOS ABS: 0 10*3/uL (ref 0.0–0.1)
Basophils Relative: 0 %
EOS ABS: 0.1 10*3/uL (ref 0.0–0.5)
EOS PCT: 2 %
HCT: 38.8 % (ref 38.4–49.9)
Hemoglobin: 13 g/dL (ref 13.0–17.1)
Lymphocytes Relative: 13 %
Lymphs Abs: 0.9 10*3/uL (ref 0.9–3.3)
MCH: 34.7 pg — ABNORMAL HIGH (ref 27.2–33.4)
MCHC: 33.5 g/dL (ref 32.0–36.0)
MCV: 103.5 fL — ABNORMAL HIGH (ref 79.3–98.0)
MONO ABS: 0.7 10*3/uL (ref 0.1–0.9)
Monocytes Relative: 11 %
Neutro Abs: 5 10*3/uL (ref 1.5–6.5)
Neutrophils Relative %: 74 %
PLATELETS: 194 10*3/uL (ref 140–400)
RBC: 3.75 MIL/uL — AB (ref 4.20–5.82)
RDW: 14.2 % (ref 11.0–14.6)
WBC: 6.8 10*3/uL (ref 4.0–10.3)

## 2017-10-01 LAB — PROTIME-INR
INR: 1.08
PROTHROMBIN TIME: 13.9 s (ref 11.4–15.2)

## 2017-10-01 NOTE — Progress Notes (Signed)
Met with Mr. Mckiver, his wife and daughter.Bryan Lemma role of nurse navigator. Educational information provided on liver cancer. [place] resources provided to patient, including SW service information.   SW contact information provided to patient along with written information about "Living With Cancer Support Group".  Contact names and phone numbers were provided for entire [facility] team.  Teach back method was used.  No barriers to care identified at present time.  Will continue to follow as needed

## 2017-10-01 NOTE — Progress Notes (Signed)
[  Other Dx] - No Medical Intervention - Off Treatment.    Patient Characteristics:

## 2017-10-01 NOTE — Telephone Encounter (Signed)
Scheduled appt per 9/3 los - gave patient aVS and calender per los.  

## 2017-10-02 LAB — AFP TUMOR MARKER: AFP, SERUM, TUMOR MARKER: 86.4 ng/mL — AB (ref 0.0–8.3)

## 2017-10-04 ENCOUNTER — Telehealth: Payer: Self-pay

## 2017-10-04 NOTE — Telephone Encounter (Signed)
Spoke with patient's wife informed her about his appointment tomorrow 9/7 to arrive by 10:15 for his first treatment.  She verbalized an understanding and was aware of where they should present.

## 2017-10-05 ENCOUNTER — Inpatient Hospital Stay: Payer: Medicare Other

## 2017-10-05 VITALS — BP 143/54 | HR 63 | Temp 97.8°F | Resp 17

## 2017-10-05 DIAGNOSIS — C220 Liver cell carcinoma: Secondary | ICD-10-CM

## 2017-10-05 DIAGNOSIS — Z7189 Other specified counseling: Secondary | ICD-10-CM

## 2017-10-05 DIAGNOSIS — Z5112 Encounter for antineoplastic immunotherapy: Secondary | ICD-10-CM | POA: Diagnosis not present

## 2017-10-05 MED ORDER — SODIUM CHLORIDE 0.9 % IV SOLN: 15.0000 mg/kg | Freq: Once | INTRAVENOUS | Status: DC

## 2017-10-05 MED ORDER — SODIUM CHLORIDE 0.9 % IV SOLN
1200.0000 mg | Freq: Once | INTRAVENOUS | Status: AC
  Administered 2017-10-05: 1200 mg via INTRAVENOUS
  Filled 2017-10-05: qty 20

## 2017-10-05 MED ORDER — SODIUM CHLORIDE 0.9 % IV SOLN
1000.0000 mg | Freq: Once | INTRAVENOUS | Status: AC
  Administered 2017-10-05: 1000 mg via INTRAVENOUS
  Filled 2017-10-05: qty 32

## 2017-10-05 MED ORDER — SODIUM CHLORIDE 0.9 % IV SOLN
Freq: Once | INTRAVENOUS | Status: AC
  Administered 2017-10-05: 11:00:00 via INTRAVENOUS
  Filled 2017-10-05: qty 250

## 2017-10-05 NOTE — Progress Notes (Signed)
Discharge instructions reviewed and given to the pt.  Pt verbalized understanding. 

## 2017-10-05 NOTE — Patient Instructions (Addendum)
[place] Discharge Instructions for Patients Receiving Chemotherapy  Today you received the following chemotherapy agents Tecentriq,Avastin  To help prevent nausea and vomiting after your treatment, we encourage you to take your nausea medication as directed  If you develop nausea and vomiting that is not controlled by your nausea medication, call the clinic.   BELOW ARE SYMPTOMS THAT SHOULD BE REPORTED IMMEDIATELY:  *FEVER GREATER THAN 100.5 F  *CHILLS WITH OR WITHOUT FEVER  NAUSEA AND VOMITING THAT IS NOT CONTROLLED WITH YOUR NAUSEA MEDICATION  *UNUSUAL SHORTNESS OF BREATH  *UNUSUAL BRUISING OR BLEEDING  TENDERNESS IN MOUTH AND THROAT WITH OR WITHOUT PRESENCE OF ULCERS  *URINARY PROBLEMS  *BOWEL PROBLEMS  UNUSUAL RASH Items with * indicate a potential emergency and should be followed up as soon as possible.  Feel free to call the clinic should you have any questions or concerns. The clinic phone number is [phone].  Please show the [facility] at check-in to the Emergency Department and triage nurse.   Atezolizumab injection What is this medicine? ATEZOLIZUMAB (a te zoe LIZ ue mab) is a monoclonal antibody. It is used to treat bladder cancer (urothelial cancer) and non-small cell lung cancer. This medicine may be used for other purposes; ask your health care provider or pharmacist if you have questions. COMMON BRAND NAME(S): Tecentriq What should I tell my health care provider before I take this medicine? They need to know if you have any of these conditions: -diabetes -immune system problems -infection -inflammatory bowel disease -liver disease -lung or breathing disease -lupus -nervous system problems like myasthenia gravis or Guillain-Barre syndrome -organ transplant -an unusual or allergic reaction to atezolizumab, other medicines, foods, dyes, or preservatives -pregnant or trying to get pregnant -breast-feeding How should I use  this medicine? This medicine is for infusion into a vein. It is given by a health care professional in a hospital or clinic setting. A special MedGuide will be given to you before each treatment. Be sure to read this information carefully each time. Talk to your pediatrician regarding the use of this medicine in children. Special care may be needed. Overdosage: If you think you have taken too much of this medicine contact a poison control center or emergency room at once. NOTE: This medicine is only for you. Do not share this medicine with others. What if I miss a dose? It is important not to miss your dose. Call your doctor or health care professional if you are unable to keep an appointment. What may interact with this medicine? Interactions have not been studied. This list may not describe all possible interactions. Give your health care provider a list of all the medicines, herbs, non-prescription drugs, or dietary supplements you use. Also tell them if you smoke, drink alcohol, or use illegal drugs. Some items may interact with your medicine. What should I watch for while using this medicine? Your condition will be monitored carefully while you are receiving this medicine. You may need blood work done while you are taking this medicine. Do not become pregnant while taking this medicine or for at least 5 months after stopping it. Women should inform their doctor if they wish to become pregnant or think they might be pregnant. There is a potential for serious side effects to an unborn child. Talk to your health care professional or pharmacist for more information. Do not breast-feed an infant while taking this medicine or for at least 5 months after the last dose. What side  effects may I notice from receiving this medicine? Side effects that you should report to your doctor or health care professional as soon as possible: -allergic reactions like skin rash, itching or hives, swelling of the face,  lips, or tongue -black, tarry stools -bloody or watery diarrhea -breathing problems -changes in vision -chest pain or chest tightness -chills -facial flushing -fever -headache -signs and symptoms of high blood sugar such as dizziness; dry mouth; dry skin; fruity breath; nausea; stomach pain; increased hunger or thirst; increased urination -signs and symptoms of liver injury like dark yellow or brown urine; general ill feeling or flu-like symptoms; light-colored stools; loss of appetite; nausea; right upper belly pain; unusually weak or tired; yellowing of the eyes or skin -stomach pain -trouble passing urine or change in the amount of urine Side effects that usually do not require medical attention (report to your doctor or health care professional if they continue or are bothersome): -cough -diarrhea -joint pain -muscle pain -muscle weakness -tiredness -weight loss This list may not describe all possible side effects. Call your doctor for medical advice about side effects. You may report side effects to FDA at 1-800-FDA-1088. Where should I keep my medicine? This drug is given in a hospital or clinic and will not be stored at home. NOTE: This sheet is a summary. It may not cover all possible information. If you have questions about this medicine, talk to your doctor, pharmacist, or health care provider.  2018 Elsevier/Gold Standard (2015-02-16 17:54:14)   Bevacizumab injection What is this medicine? BEVACIZUMAB (be va SIZ yoo mab) is a monoclonal antibody. It is used to treat many types of cancer. This medicine may be used for other purposes; ask your health care provider or pharmacist if you have questions. COMMON BRAND NAME(S): Avastin What should I tell my health care provider before I take this medicine? They need to know if you have any of these conditions: -diabetes -heart disease -high blood pressure -history of coughing up blood -prior anthracycline chemotherapy  (e.g., doxorubicin, daunorubicin, epirubicin) -recent or ongoing radiation therapy -recent or planning to have surgery -stroke -an unusual or allergic reaction to bevacizumab, hamster proteins, mouse proteins, other medicines, foods, dyes, or preservatives -pregnant or trying to get pregnant -breast-feeding How should I use this medicine? This medicine is for infusion into a vein. It is given by a health care professional in a hospital or clinic setting. Talk to your pediatrician regarding the use of this medicine in children. Special care may be needed. Overdosage: If you think you have taken too much of this medicine contact a poison control center or emergency room at once. NOTE: This medicine is only for you. Do not share this medicine with others. What if I miss a dose? It is important not to miss your dose. Call your doctor or health care professional if you are unable to keep an appointment. What may interact with this medicine? Interactions are not expected. This list may not describe all possible interactions. Give your health care provider a list of all the medicines, herbs, non-prescription drugs, or dietary supplements you use. Also tell them if you smoke, drink alcohol, or use illegal drugs. Some items may interact with your medicine. What should I watch for while using this medicine? Your condition will be monitored carefully while you are receiving this medicine. You will need important blood work and urine testing done while you are taking this medicine. This medicine may increase your risk to bruise or bleed. Call your  doctor or health care professional if you notice any unusual bleeding. This medicine should be started at least 28 days following major surgery and the site of the surgery should be totally healed. Check with your doctor before scheduling dental work or surgery while you are receiving this treatment. Talk to your doctor if you have recently had surgery or if you  have a wound that has not healed. Do not become pregnant while taking this medicine or for 6 months after stopping it. Women should inform their doctor if they wish to become pregnant or think they might be pregnant. There is a potential for serious side effects to an unborn child. Talk to your health care professional or pharmacist for more information. Do not breast-feed an infant while taking this medicine and for 6 months after the last dose. This medicine has caused ovarian failure in some women. This medicine may interfere with the ability to have a child. You should talk to your doctor or health care professional if you are concerned about your fertility. What side effects may I notice from receiving this medicine? Side effects that you should report to your doctor or health care professional as soon as possible: -allergic reactions like skin rash, itching or hives, swelling of the face, lips, or tongue -chest pain or chest tightness -chills -coughing up blood -high fever -seizures -severe constipation -signs and symptoms of bleeding such as bloody or black, tarry stools; red or dark-brown urine; spitting up blood or brown material that looks like coffee grounds; red spots on the skin; unusual bruising or bleeding from the eye, gums, or nose -signs and symptoms of a blood clot such as breathing problems; chest pain; severe, sudden headache; pain, swelling, warmth in the leg -signs and symptoms of a stroke like changes in vision; confusion; trouble speaking or understanding; severe headaches; sudden numbness or weakness of the face, arm or leg; trouble walking; dizziness; loss of balance or coordination -stomach pain -sweating -swelling of legs or ankles -vomiting -weight gain Side effects that usually do not require medical attention (report to your doctor or health care professional if they continue or are bothersome): -back pain -changes in taste -decreased appetite -dry  skin -nausea -tiredness This list may not describe all possible side effects. Call your doctor for medical advice about side effects. You may report side effects to FDA at 1-800-FDA-1088. Where should I keep my medicine? This drug is given in a hospital or clinic and will not be stored at home. NOTE: This sheet is a summary. It may not cover all possible information. If you have questions about this medicine, talk to your doctor, pharmacist, or health care provider.  2018 Elsevier/Gold Standard (2016-01-13 14:33:29)

## 2017-10-07 ENCOUNTER — Telehealth: Payer: Self-pay

## 2017-10-07 NOTE — Telephone Encounter (Signed)
-----   Message from [place], RN sent at 10/05/2017  1:01 PM EDT ----- Regarding: Dr. Burr Medico first time chemo follow up call First time Tecentriq and avastin infusion, pt tolerated the infusion well

## 2017-10-07 NOTE — Telephone Encounter (Signed)
Spoke with patient's wife to follow up on first time Tecentriq and Avastin infusion, she states patient has not experienced any adverse side effects from infusions.   Instructed her to call if develops, she verbalized an understanding.

## 2017-10-08 ENCOUNTER — Telehealth: Payer: Self-pay | Admitting: Hematology

## 2017-10-08 NOTE — Telephone Encounter (Signed)
Faxed records to [facility] [phone]

## 2017-10-13 NOTE — Progress Notes (Deleted)
[place]  Telephone:([phone]) [phone] [phone]) [phone]  Clinic Follow up Note   Patient Care Team: System, Pcp Not In as PCP - General 10/13/2017  SUMMARY OF ONCOLOGIC HISTORY: Oncology History   Cancer Staging Hepatocellular carcinoma [facility]) Staging form: Liver, AJCC 8th Edition - Clinical stage from 10/01/2017: Stage IVB (cT4, cN0, cM1) - Signed by Truitt Merle, MD on 10/01/2017       Hepatocellular carcinoma ([place])   09/16/2017 Imaging    09/16/2017 Abdominal US IMPRESSION: 1. At least 2 large hepatic masses, both located within the RIGHT liver lobe, measuring 8 cm and 7.3 cm respectively. These are highly suspicious for neoplastic masses. If/when patient is clinically stable and able to follow directions and hold their breath (preferably as an outpatient) further evaluation with dedicated liver MRI should be considered. 2. Suspect underlying liver cirrhosis. 3. Small amount of ascites within the RIGHT upper quadrant. 4. Cholelithiasis without convincing evidence of acute cholecystitis. No bile duct dilatation.    09/16/2017 - 09/22/2017 Hospital Admission    Admit date: 09/16/2017 Admission diagnosis: [facility] Additional comments: He was hospitalized on 09/16/2017 for weakness and fever, and was treated empirically for possible pneumonia and presumed sepsis. Abdominal US showed multiple liver lesions suspicious of malignancy and Dr. Alen Blew was consulted. Patient was discharged on 09/22/2017.     09/17/2017 Imaging    09/17/2017 CT CAP IMPRESSION: 1. Large malignant-appearing mass in the right lobe of the liver measuring 7.5 x 10.0 x 12.7 cm. Given the underlying cirrhosis, this presumably reflects a primary hepatocellular carcinoma. 2. 1.3 x 0.7 x 1.3 cm nodule in the left upper lobe posteriorly. The possibility of a metastatic lesion should be considered, however, this may alternatively reflect an infectious or inflammatory nodule. Close attention on future follow-up  imaging is recommended. 3. Small to moderate volume of ascites. 4. Moderate to large bilateral pleural effusions lying dependently with associated passive subsegmental atelectasis in the lower lobes of the lungs bilaterally. 5. Mild cardiomegaly. 6. Aortic atherosclerosis, in addition to left main and 3 vessel coronary artery disease. Assessment for potential risk factor modification, dietary therapy or pharmacologic therapy may be warranted, if clinically indicated. 7. There are calcifications of the aortic valve and mitral annulus. Echocardiographic correlation for evaluation of potential valvular dysfunction may be warranted if clinically indicated. 8. Colonic diverticulosis. 9. Additional incidental findings, as above.     09/18/2017 Procedure    09/18/2017 Thoracocentesis  IMPRESSION: Successful ultrasound guided right thoracentesis yielding 680 mL of pleural fluid.    09/18/2017 Pathology Results    Pathology 09/18/2017 Cytology Diagnosis PLEURAL FLUID, RIGHT (SPECIMEN 1 OF 1, COLLECTED ON 09/18/17): NO MALIGNANT CELLS IDENTIFIED.    09/18/2017 Tumor Marker    Tumor Markers AFP Results for HAIDEN, RAWLINSON (MRN [id number]) as of 09/30/2017 11:42  Ref. Range 09/18/2017 14:34  AFP, Serum, Tumor Marker Latest Ref Range: 0.0 - 8.3 ng/mL 78.6 (H)       09/20/2017 Imaging    09/20/2017 MRI Liver IMPRESSION: 1. Again demonstrated is a large hepatic mass with intravascular invasion involving the hepatic veins, IVC and portal vein. This remains most consistent with hepatocellular carcinoma. 2. Generalized soft tissue edema with ascites and bilateral pleural effusions. Right pleural effusion has decreased in volume from previous CT. 3. Underlying hepatic cirrhosis with multiple portosystemic collaterals consistent with portal hypertension.    09/30/2017 Initial Diagnosis    Hepatocellular carcinoma ([place])    10/01/2017 Cancer Staging    Staging form: Liver, AJCC 8th Edition -  Clinical stage from 10/01/2017: Stage IVB (cT4, cN0, cM1) - Signed by Truitt Merle, MD on 10/01/2017   CURRENT THERAPY: atezolizumab and bevacizumab   INTERVAL HISTORY: Please see below for problem oriented charting.  REVIEW OF SYSTEMS:   Constitutional: Denies fevers, chills or abnormal weight loss Eyes: Denies blurriness of vision Ears, nose, mouth, throat, and face: Denies mucositis or sore throat Respiratory: Denies cough, dyspnea or wheezes Cardiovascular: Denies palpitation, chest discomfort or lower extremity swelling Gastrointestinal:  Denies nausea, heartburn or change in bowel habits Skin: Denies abnormal skin rashes Lymphatics: Denies new lymphadenopathy or easy bruising Neurological:Denies numbness, tingling or new weaknesses Behavioral/Psych: Mood is stable, no new changes  All other systems were reviewed with the patient and are negative.  MEDICAL HISTORY:  Past Medical History:  Diagnosis Date  . Acne rosacea   . Hyperlipidemia   . Hypertension   . Liver cirrhosis, alcoholic ([place])   . Nocturia   . Skin cancer     SURGICAL HISTORY: Past Surgical History:  Procedure Laterality Date  . prostectomy  2012  . TONSILLECTOMY      I have reviewed the social history and family history with the patient and they are unchanged from previous note.  ALLERGIES:  is allergic to oysters [shellfish allergy]; doxycycline; and hydroxyzine hcl.  MEDICATIONS:  Current Outpatient Medications  Medication Sig Dispense Refill  . Ascorbic Acid (VITAMIN C) 100 MG tablet Take 300 mg by mouth daily.    . feeding supplement, ENSURE ENLIVE, (ENSURE ENLIVE) LIQD Take 237 mLs by mouth 2 (two) times daily between meals. 237 mL 2  . furosemide (LASIX) 40 MG tablet Take 1 tablet (40 mg total) by mouth 2 (two) times daily. 60 tablet 0  . potassium chloride SA (K-DUR,KLOR-CON) 20 MEQ tablet Take 2 tablets (40 mEq total) by mouth daily. 30 tablet 0  . triamcinolone cream (KENALOG) 0.1 % Apply 1  application topically 2 (two) times daily.     No current facility-administered medications for this visit.     PHYSICAL EXAMINATION: ECOG PERFORMANCE STATUS: {CHL ONC ECOG PS:[id number]}  There were no vitals filed for this visit. There were no vitals filed for this visit.  GENERAL:alert, no distress and comfortable SKIN: skin color, texture, turgor are normal, no rashes or significant lesions EYES: normal, Conjunctiva are pink and non-injected, sclera clear OROPHARYNX:no exudate, no erythema and lips, buccal mucosa, and tongue normal  NECK: supple, thyroid normal size, non-tender, without nodularity LYMPH:  no palpable lymphadenopathy in the cervical, axillary or inguinal LUNGS: clear to auscultation and percussion with normal breathing effort HEART: regular rate & rhythm and no murmurs and no lower extremity edema ABDOMEN:abdomen soft, non-tender and normal bowel sounds Musculoskeletal:no cyanosis of digits and no clubbing  NEURO: alert & oriented x 3 with fluent speech, no focal motor/sensory deficits  LABORATORY DATA:  I have reviewed the data as listed CBC Latest Ref Rng & Units 10/01/2017 09/21/2017 09/20/2017  WBC 4.0 - 10.3 K/uL 6.8 6.3 6.3  Hemoglobin 13.0 - 17.1 g/dL 13.0 14.1 14.7  Hematocrit 38.4 - 49.9 % 38.8 41.8 43.3  Platelets 140 - 400 K/uL 194 152 176     CMP Latest Ref Rng & Units 10/01/2017 09/22/2017 09/21/2017  Glucose 70 - 99 mg/dL 121(H) 103(H) 82  BUN 8 - 23 mg/dL 19 16 19   Creatinine 0.61 - 1.24 mg/dL 0.76 0.67 0.72  Sodium 135 - 145 mmol/L 135 136 138  Potassium 3.5 - 5.1 mmol/L 4.3 4.1 4.5  Chloride 98 -  111 mmol/L 100 103 105  CO2 22 - 32 mmol/L 29 27 26   Calcium 8.9 - 10.3 mg/dL 9.1 8.6(L) 8.8(L)  Total Protein 6.5 - 8.1 g/dL 6.8 - -  Total Bilirubin 0.3 - 1.2 mg/dL 1.5(H) - -  Alkaline Phos 38 - 126 U/L 266(H) - -  AST 15 - 41 U/L 109(H) - -  ALT 0 - 44 U/L 146(H) - -   Tumor Markers AFP Results for CHASON, MCIVER (MRN [id number]) as of  09/30/2017 11:42  Ref. Range 09/18/2017 14:34  AFP, Serum, Tumor Marker Latest Ref Range: 0.0 - 8.3 ng/mL 78.6 (H)    Procedures  09/18/2017 Thoracocentesis  IMPRESSION: Successful ultrasound guided right thoracentesis yielding 680 mL of pleural fluid.   Pathology 09/18/2017 Cytology Diagnosis PLEURAL FLUID, RIGHT (SPECIMEN 1 OF 1, COLLECTED ON 09/18/17): NO MALIGNANT CELLS IDENTIFIED.      RADIOGRAPHIC STUDIES: I have personally reviewed the radiological images as listed and agreed with the findings in the report. No results found.   ASSESSMENT & PLAN: Roemello Speyer is a 77 y.o. male with history of HTN and HLD, heavy smoking and alcohol abuse history, presented with 2 months history of leg swelling, abdominal discomfort, weakness and intermittent confusion.  1. 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 Pleural effusions and leg edema  4. Liver cirrhosis, child Pugh class B,  likely related to alcohol, portal hypertension and metabolic encephalopathy  5. Generalized weakness - multifactorial  6. Cognitive decline - Family reports progressive symptoms of memory loss for past several months.  - B12, TSH unremarkable  7. Goal of care discussion    Plan No problem-specific Assessment & Plan notes found for this encounter.   No orders of the defined types were placed in this encounter.  All questions were answered. The patient knows to call the clinic with any problems, questions or concerns. No barriers to learning was detected. I spent {CHL ONC TIME VISIT - RPRXY:[id number]} counseling the patient face to face. The total time spent in the appointment was {CHL ONC TIME VISIT - KMMNO:[id number]} and more than 50% was on counseling and review of test results     Alla Feeling, NP 10/13/17

## 2017-10-14 ENCOUNTER — Telehealth: Payer: Self-pay | Admitting: Hematology

## 2017-10-14 NOTE — Telephone Encounter (Signed)
Spoke to pts wife regarding upcoming appts per 9/13 sch message.

## 2017-10-15 ENCOUNTER — Inpatient Hospital Stay: Payer: Medicare Other | Admitting: Nurse Practitioner

## 2017-10-15 ENCOUNTER — Inpatient Hospital Stay: Payer: Medicare Other

## 2017-10-15 ENCOUNTER — Telehealth: Payer: Self-pay

## 2017-10-15 ENCOUNTER — Other Ambulatory Visit: Payer: Self-pay

## 2017-10-15 DIAGNOSIS — C220 Liver cell carcinoma: Secondary | ICD-10-CM

## 2017-10-15 NOTE — Telephone Encounter (Signed)
Patient's wife calls requesting a signed DNR be sent to the house.   I will check with SW.   Also requesting Home Care for wound care to an open area on his arm that has not healed since hospitalization. Referral sent to Home Care.

## 2017-10-15 NOTE — Progress Notes (Signed)
Will contact SW regarding DNR

## 2017-10-16 ENCOUNTER — Emergency Department (HOSPITAL_COMMUNITY)
Admission: EM | Admit: 2017-10-16 | Discharge: 2017-10-16 | Disposition: A | Discharge status: Home / Self Care | Payer: Medicare Other | Attending: Emergency Medicine | Admitting: Emergency Medicine

## 2017-10-16 ENCOUNTER — Encounter (HOSPITAL_COMMUNITY): Payer: Self-pay | Admitting: Emergency Medicine

## 2017-10-16 ENCOUNTER — Other Ambulatory Visit: Payer: Self-pay

## 2017-10-16 ENCOUNTER — Telehealth: Payer: Self-pay

## 2017-10-16 ENCOUNTER — Emergency Department (HOSPITAL_COMMUNITY): Payer: Medicare Other

## 2017-10-16 DIAGNOSIS — Z79899 Other long term (current) drug therapy: Secondary | ICD-10-CM | POA: Insufficient documentation

## 2017-10-16 DIAGNOSIS — I10 Essential (primary) hypertension: Secondary | ICD-10-CM | POA: Diagnosis not present

## 2017-10-16 DIAGNOSIS — R509 Fever, unspecified: Secondary | ICD-10-CM | POA: Diagnosis present

## 2017-10-16 DIAGNOSIS — Z5112 Encounter for antineoplastic immunotherapy: Secondary | ICD-10-CM | POA: Diagnosis not present

## 2017-10-16 DIAGNOSIS — Z85828 Personal history of other malignant neoplasm of skin: Secondary | ICD-10-CM | POA: Insufficient documentation

## 2017-10-16 DIAGNOSIS — Z87891 Personal history of nicotine dependence: Secondary | ICD-10-CM | POA: Insufficient documentation

## 2017-10-16 LAB — URINALYSIS, ROUTINE W REFLEX MICROSCOPIC
Bilirubin Urine: NEGATIVE
GLUCOSE, UA: NEGATIVE mg/dL
HGB URINE DIPSTICK: NEGATIVE
Ketones, ur: NEGATIVE mg/dL
LEUKOCYTES UA: NEGATIVE
Nitrite: NEGATIVE
PH: 5 (ref 5.0–8.0)
PROTEIN: NEGATIVE mg/dL
Specific Gravity, Urine: 1.023 (ref 1.005–1.030)

## 2017-10-16 LAB — COMPREHENSIVE METABOLIC PANEL
ALT: 140 U/L — ABNORMAL HIGH (ref 0–44)
ANION GAP: 7 (ref 5–15)
AST: 107 U/L — AB (ref 15–41)
Albumin: 2.1 g/dL — ABNORMAL LOW (ref 3.5–5.0)
Alkaline Phosphatase: 225 U/L — ABNORMAL HIGH (ref 38–126)
BUN: 20 mg/dL (ref 8–23)
CHLORIDE: 104 mmol/L (ref 98–111)
CO2: 28 mmol/L (ref 22–32)
Calcium: 8.6 mg/dL — ABNORMAL LOW (ref 8.9–10.3)
Creatinine, Ser: 0.61 mg/dL (ref 0.61–1.24)
GFR calc Af Amer: >60 mL/min (ref >60)
GFR calc non Af Amer: >60 mL/min (ref >60)
GLUCOSE: 111 mg/dL — AB (ref 70–99)
POTASSIUM: 4.7 mmol/L (ref 3.5–5.1)
SODIUM: 139 mmol/L (ref 135–145)
Total Bilirubin: 1.2 mg/dL (ref 0.3–1.2)
Total Protein: 6 g/dL — ABNORMAL LOW (ref 6.5–8.1)

## 2017-10-16 LAB — CBC WITH DIFFERENTIAL/PLATELET
Basophils Absolute: 0.1 10*3/uL (ref 0.0–0.1)
Basophils Relative: 1 %
EOS PCT: 4 %
Eosinophils Absolute: 0.2 10*3/uL (ref 0.0–0.7)
HCT: 37.8 % — ABNORMAL LOW (ref 39.0–52.0)
Hemoglobin: 12.8 g/dL — ABNORMAL LOW (ref 13.0–17.0)
LYMPHS ABS: 0.8 10*3/uL (ref 0.7–4.0)
LYMPHS PCT: 16 %
MCH: 34.7 pg — AB (ref 26.0–34.0)
MCHC: 33.9 g/dL (ref 30.0–36.0)
MCV: 102.4 fL — AB (ref 78.0–100.0)
MONO ABS: 0.8 10*3/uL (ref 0.1–1.0)
Monocytes Relative: 17 %
Neutro Abs: 3.1 10*3/uL (ref 1.7–7.7)
Neutrophils Relative %: 62 %
PLATELETS: 200 10*3/uL (ref 150–400)
RBC: 3.69 MIL/uL — AB (ref 4.22–5.81)
RDW: 14.6 % (ref 11.5–15.5)
WBC: 5 10*3/uL (ref 4.0–10.5)

## 2017-10-16 LAB — I-STAT CG4 LACTIC ACID, ED: LACTIC ACID, VENOUS: 0.87 mmol/L (ref 0.5–1.9)

## 2017-10-16 NOTE — Telephone Encounter (Signed)
Spoke with patient's wife regarding Home Health they had already been in touch with her, but she declined their services at this time.   DNR I explained Dr. Burr Medico out of office this week but will see if Cira Rue NP will sign and I will mail it to her.   She verbalized an understanding.

## 2017-10-16 NOTE — ED Provider Notes (Signed)
[facility] DEPT Provider Note   CSN: [id number] Arrival date & time: 10/16/17  1905     History   Chief Complaint Chief Complaint  Patient presents with  . Fever    HPI Devin Reeves is a 77 y.o. male.  He is presenting to the emergency department with on and off fever for 2 days.  Had a low-grade fever last night and then again today had a maximum temperature of 101.4.  He has been using Tylenol as needed.  He denies any specific complaints.  His daughter is here and states that he has been a little more tired and irritable and confused today.  He has a history of newly diagnosed hepatocellular carcinoma and is on chemotherapy every 6 weeks.  He follows with Dr. Burr Medico here.  Specifically no headaches no cough no shortness of breath no abdominal pain no nausea no vomiting.  States he has had frequent stools but no diarrhea.  No urinary symptoms.  No sick contacts no recent travel.  The history is provided by the patient and a relative.  Fever   This is a new problem. The current episode started yesterday. The problem has been resolved. The maximum temperature noted was 101 to 101.9 F. Associated symptoms include sleepiness. Pertinent negatives include no chest pain, no vomiting, no headaches, no sore throat, no muscle aches and no cough. He has tried acetaminophen for the symptoms. The treatment provided significant relief.    Past Medical History:  Diagnosis Date  . Acne rosacea   . Hyperlipidemia   . Hypertension   . Liver cirrhosis, alcoholic ([place])   . Nocturia   . Skin cancer     Patient Active Problem List   Diagnosis Date Noted  . Goals of care, counseling/discussion 10/01/2017  . Hepatocellular carcinoma ([place]) 09/30/2017  . Protein-calorie malnutrition, severe 09/21/2017  . Liver masses 09/17/2017  . Sepsis due to pneumonia ([place]) 09/16/2017  . Elevated liver function tests 09/16/2017  . Weakness 09/16/2017  . Toxic encephalopathy  09/16/2017  . Nocturia     Past Surgical History:  Procedure Laterality Date  . prostectomy  2012  . TONSILLECTOMY          Home Medications    Prior to Admission medications   Medication Sig Start Date End Date Taking? Authorizing Provider  Ascorbic Acid (VITAMIN C) 100 MG tablet Take 300 mg by mouth daily.    [provider]  feeding supplement, ENSURE ENLIVE, (ENSURE ENLIVE) LIQD Take 237 mLs by mouth 2 (two) times daily between meals. 09/22/17   Domenic Polite, MD  furosemide (LASIX) 40 MG tablet Take 1 tablet (40 mg total) by mouth 2 (two) times daily. 09/22/17   Domenic Polite, MD  potassium chloride SA (K-DUR,KLOR-CON) 20 MEQ tablet Take 2 tablets (40 mEq total) by mouth daily. 09/22/17   Domenic Polite, MD  triamcinolone cream (KENALOG) 0.1 % Apply 1 application topically 2 (two) times daily.    [provider]    Family History Family History  Problem Relation Age of Onset  . Cancer Maternal Grandfather        bone cancer    Social History Social History   Tobacco Use  . Smoking status: Former Smoker    Packs/day: 2.00    Years: 20.00    Pack years: 40.00  . Smokeless tobacco: Never Used  . Tobacco comment: quit in 1999  Substance Use Topics  . Alcohol use: Yes    Alcohol/week: 14.0 standard  drinks    Types: 14 Glasses of wine per week    Comment: quit completely in 06/2017  . Drug use: No     Allergies   Oysters [shellfish allergy]; Doxycycline; and Hydroxyzine hcl   Review of Systems Review of Systems  Constitutional: Positive for activity change, fatigue and fever.  HENT: Negative for sore throat.   Eyes: Negative for visual disturbance.  Respiratory: Negative for cough and shortness of breath.   Cardiovascular: Negative for chest pain.  Gastrointestinal: Negative for abdominal pain and vomiting.  Genitourinary: Negative for dysuria.  Musculoskeletal: Negative for back pain and neck pain.  Skin: Negative for rash.    Neurological: Negative for headaches.  Psychiatric/Behavioral: Positive for agitation and confusion.     Physical Exam Updated Vital Signs BP (!) 130/54 (BP Location: Left Arm)   Pulse 63   Temp 97.7 F (36.5 C) (Oral)   Resp (!) 24   SpO2 97%   Physical Exam  Constitutional: He appears well-developed and well-nourished.  HENT:  Head: Normocephalic and atraumatic.  Eyes: Conjunctivae are normal.  Neck: Neck supple.  Cardiovascular: Normal rate, regular rhythm, normal heart sounds and intact distal pulses.  No murmur heard. Pulmonary/Chest: Effort normal and breath sounds normal. No respiratory distress.  Abdominal: Soft. There is no tenderness.  Musculoskeletal: He exhibits no edema, tenderness or deformity.  He is got a few scattered bruises and he has a skin tear in his left elbow without obvious signs of infection.  Neurological: He is alert.  Skin: Skin is warm and dry. Capillary refill takes less than 2 seconds.  Psychiatric: He has a normal mood and affect.  Nursing note and vitals reviewed.    ED Treatments / Results  Labs (all labs ordered are listed, but only abnormal results are displayed) Labs Reviewed  COMPREHENSIVE METABOLIC PANEL - Abnormal; Notable for the following components:      Result Value   Glucose, Bld 111 (*)    Calcium 8.6 (*)    Total Protein 6.0 (*)    Albumin 2.1 (*)    AST 107 (*)    ALT 140 (*)    Alkaline Phosphatase 225 (*)    All other components within normal limits  CBC WITH DIFFERENTIAL/PLATELET - Abnormal; Notable for the following components:   RBC 3.69 (*)    Hemoglobin 12.8 (*)    HCT 37.8 (*)    MCV 102.4 (*)    MCH 34.7 (*)    All other components within normal limits  URINALYSIS, ROUTINE W REFLEX MICROSCOPIC - Abnormal; Notable for the following components:   Color, Urine AMBER (*)    All other components within normal limits  CULTURE, BLOOD (ROUTINE X 2)  CULTURE, BLOOD (ROUTINE X 2)  I-STAT CG4 LACTIC ACID, ED     EKG None  Radiology Dg Chest 2 View  Result Date: 10/16/2017 CLINICAL DATA:  Fever EXAM: CHEST - 2 VIEW COMPARISON:  09/18/2017 FINDINGS: Top-normal size heart with aortic atherosclerosis. No aneurysm. No overt pulmonary edema. Hazy opacity at the left lung base consistent with a layering left effusion is unchanged. Probable adjacent left basilar compressive atelectasis. Pneumonia would be difficult to entirely exclude. Osteoarthritis of the AC joints bilaterally. IMPRESSION: Hazy opacity at the left base consistent with a small layering left pleural effusion unchanged in appearance. Probable left basilar atelectasis and/or pneumonia. No new abnormality is identified. Electronically Signed   By: Ashley Royalty M.D.   On: 10/16/2017 20:06    Procedures Procedures (  including critical care time)  Medications Ordered in ED Medications - No data to display   Initial Impression / Assessment and Plan / ED Course  I have reviewed the triage vital signs and the nursing notes.  Pertinent labs & imaging results that were available during my care of the patient were reviewed by me and considered in my medical decision making (see chart for details).  Clinical Course as of Oct 18 1150  Wed Oct 16, 2017  2055 ECG is normal sinus rhythm rate of 60 normal intervals no acute ST-T changes.   [MB]  2155 Discussed with medical oncology on-call Dr. Alen Blew who does not feel the patient needs to be admitted to the hospital and does not recommend antibiotics at this time.  Reviewed this with the patient and his family and they are comfortable with discharge.   [MB]    Clinical Course User Index [MB] Hayden Rasmussen, MD    Final Clinical Impressions(s) / ED Diagnoses   Final diagnoses:  Fever in adult    ED Discharge Orders    None       Hayden Rasmussen, MD 10/17/17 1152

## 2017-10-16 NOTE — Discharge Instructions (Addendum)
You were seen in the emergency department for a fever.  You had chest x-ray blood work urinalysis that did not show an obvious cause of your symptoms.  You should continue to use Tylenol as needed for fever.  Please call your oncology team tomorrow for further evaluation and instructions.  Return if any concerns.

## 2017-10-16 NOTE — ED Triage Notes (Signed)
Patient arrives from home with complaints of a fever. Patient hx of liver cancer, unsure when last chemo was. Patient states had a fever of 102 at home, had tylenol about 1800. Fever 99.1 on EMS truck. Patient with no other complaints at this time.

## 2017-10-16 NOTE — ED Notes (Signed)
Bed: RESB Expected date:  Expected time:  Means of arrival:  Comments: EMS/cancer/fever 

## 2017-10-17 ENCOUNTER — Telehealth: Payer: Self-pay

## 2017-10-17 NOTE — Telephone Encounter (Signed)
Patient's wife calls wanting to know what our provider determines from his blood work last night in ED.    Consulted with Cira Rue NP informed her blood cultures are negative which means he is not septic (no infection in his blood), UA was negative, mild anemia, elevated liver enzymes which is expected with his liver cancer.  Chest x-ray showed mild pneumonia most likely resolving from August.  She verbalized an understanding and was happy with the phone call.

## 2017-10-21 LAB — CULTURE, BLOOD (ROUTINE X 2)
CULTURE: NO GROWTH
Culture: NO GROWTH

## 2017-10-25 ENCOUNTER — Telehealth: Payer: Self-pay

## 2017-10-25 ENCOUNTER — Encounter: Payer: Self-pay | Admitting: Nurse Practitioner

## 2017-10-25 ENCOUNTER — Inpatient Hospital Stay: Payer: Medicare Other

## 2017-10-25 ENCOUNTER — Telehealth: Payer: Self-pay | Admitting: Hematology

## 2017-10-25 ENCOUNTER — Inpatient Hospital Stay (HOSPITAL_BASED_OUTPATIENT_CLINIC_OR_DEPARTMENT_OTHER): Payer: Medicare Other | Admitting: Nurse Practitioner

## 2017-10-25 VITALS — BP 161/59

## 2017-10-25 VITALS — BP 132/56 | HR 66 | Temp 97.6°F | Resp 17 | Ht 68.0 in | Wt 152.4 lb

## 2017-10-25 DIAGNOSIS — C220 Liver cell carcinoma: Secondary | ICD-10-CM

## 2017-10-25 DIAGNOSIS — Z7189 Other specified counseling: Secondary | ICD-10-CM

## 2017-10-25 DIAGNOSIS — R197 Diarrhea, unspecified: Secondary | ICD-10-CM

## 2017-10-25 DIAGNOSIS — J90 Pleural effusion, not elsewhere classified: Secondary | ICD-10-CM

## 2017-10-25 DIAGNOSIS — Z5112 Encounter for antineoplastic immunotherapy: Secondary | ICD-10-CM | POA: Diagnosis not present

## 2017-10-25 DIAGNOSIS — R509 Fever, unspecified: Secondary | ICD-10-CM

## 2017-10-25 DIAGNOSIS — R05 Cough: Secondary | ICD-10-CM

## 2017-10-25 LAB — CBC WITH DIFFERENTIAL (CANCER CENTER ONLY)
Basophils Absolute: 0.1 10*3/uL (ref 0.0–0.1)
Basophils Relative: 1 %
EOS PCT: 2 %
Eosinophils Absolute: 0.1 10*3/uL (ref 0.0–0.5)
HCT: 41.5 % (ref 38.4–49.9)
Hemoglobin: 14.2 g/dL (ref 13.0–17.1)
LYMPHS ABS: 1.2 10*3/uL (ref 0.9–3.3)
LYMPHS PCT: 20 %
MCH: 34.7 pg — AB (ref 27.2–33.4)
MCHC: 34.2 g/dL (ref 32.0–36.0)
MCV: 101.2 fL — ABNORMAL HIGH (ref 79.3–98.0)
MONO ABS: 0.7 10*3/uL (ref 0.1–0.9)
MONOS PCT: 12 %
Neutro Abs: 3.8 10*3/uL (ref 1.5–6.5)
Neutrophils Relative %: 65 %
PLATELETS: 157 10*3/uL (ref 140–400)
RBC: 4.1 MIL/uL — ABNORMAL LOW (ref 4.20–5.82)
RDW: 15 % — AB (ref 11.0–14.6)
WBC Count: 5.8 10*3/uL (ref 4.0–10.3)

## 2017-10-25 LAB — CMP (CANCER CENTER ONLY)
ALK PHOS: 272 U/L — AB (ref 38–126)
ALT: 153 U/L — ABNORMAL HIGH (ref 0–44)
AST: 115 U/L — ABNORMAL HIGH (ref 15–41)
Albumin: 2.2 g/dL — ABNORMAL LOW (ref 3.5–5.0)
Anion gap: 7 (ref 5–15)
BILIRUBIN TOTAL: 2.4 mg/dL — AB (ref 0.3–1.2)
BUN: 18 mg/dL (ref 8–23)
CHLORIDE: 103 mmol/L (ref 98–111)
CO2: 25 mmol/L (ref 22–32)
Calcium: 9.2 mg/dL (ref 8.9–10.3)
Creatinine: 0.72 mg/dL (ref 0.61–1.24)
GFR, Est AFR Am: >60 mL/min (ref >60)
GFR, Estimated: >60 mL/min (ref >60)
GLUCOSE: 128 mg/dL — AB (ref 70–99)
POTASSIUM: 4.6 mmol/L (ref 3.5–5.1)
Sodium: 135 mmol/L (ref 135–145)
Total Protein: 6.8 g/dL (ref 6.5–8.1)

## 2017-10-25 LAB — TOTAL PROTEIN, URINE DIPSTICK: Protein, ur: NEGATIVE mg/dL

## 2017-10-25 MED ORDER — SODIUM CHLORIDE 0.9 % IV SOLN
14.3000 mg/kg | Freq: Once | INTRAVENOUS | Status: AC
  Administered 2017-10-25: 1000 mg via INTRAVENOUS
  Filled 2017-10-25: qty 32

## 2017-10-25 MED ORDER — AMOXICILLIN-POT CLAVULANATE 875-125 MG PO TABS: 1.0000 | ORAL_TABLET | Freq: Two times a day (BID) | ORAL | 0 refills | Status: DC

## 2017-10-25 MED ORDER — SODIUM CHLORIDE 0.9 % IV SOLN
1200.0000 mg | Freq: Once | INTRAVENOUS | Status: AC
  Administered 2017-10-25: 1200 mg via INTRAVENOUS
  Filled 2017-10-25: qty 20

## 2017-10-25 MED ORDER — SODIUM CHLORIDE 0.9 % IV SOLN
Freq: Once | INTRAVENOUS | Status: AC
  Administered 2017-10-25: 11:00:00 via INTRAVENOUS
  Filled 2017-10-25: qty 250

## 2017-10-25 NOTE — Telephone Encounter (Signed)
Appts scheduled AVS/Calendar printed per 9/27 los °

## 2017-10-25 NOTE — Telephone Encounter (Signed)
Per Dr. Burr Medico it is ok to treat today with AST 115 and total bilirubin 2.4

## 2017-10-25 NOTE — Progress Notes (Signed)
C diff ordered. Patient attempted to collect stool specimen without success. Patient's wife will attempt to collect stool specimen at home and bring it to the lab. Lacie Burton,NP made aware that patient was unable to give a stool specimen while at the [facility] today.

## 2017-10-25 NOTE — Patient Instructions (Signed)
[facility] Discharge Instructions for Patients Receiving Chemotherapy  Today you received the following chemotherapy agents Avastin and Tecentriq  To help prevent nausea and vomiting after your treatment, we encourage you to take your nausea medication as directed If you develop nausea and vomiting that is not controlled by your nausea medication, call the clinic.   BELOW ARE SYMPTOMS THAT SHOULD BE REPORTED IMMEDIATELY:  *FEVER GREATER THAN 100.5 F  *CHILLS WITH OR WITHOUT FEVER  NAUSEA AND VOMITING THAT IS NOT CONTROLLED WITH YOUR NAUSEA MEDICATION  *UNUSUAL SHORTNESS OF BREATH  *UNUSUAL BRUISING OR BLEEDING  TENDERNESS IN MOUTH AND THROAT WITH OR WITHOUT PRESENCE OF ULCERS  *URINARY PROBLEMS  *BOWEL PROBLEMS  UNUSUAL RASH Items with * indicate a potential emergency and should be followed up as soon as possible.  Feel free to call the clinic should you have any questions or concerns. The clinic phone number is [phone].  Please show the CHEMO ALERT CARD at check-in to the Emergency Department and triage nurse.   

## 2017-10-25 NOTE — Progress Notes (Signed)
Per Dr. Burr Medico it is ok to treat today with elevated AST, ALT, and bilirubin.

## 2017-10-25 NOTE — Progress Notes (Addendum)
[facility]  [phone] [phone]  Clinic Follow up Note   Patient Care Team: System, Pcp Not In as PCP - General 10/25/2017  SUMMARY OF ONCOLOGIC HISTORY: Oncology History   Cancer Staging Hepatocellular carcinoma [facility]) Staging form: Liver, AJCC 8th Edition - Clinical stage from 10/01/2017: Stage IVB (cT4, cN0, cM1) - Signed by Truitt Merle, MD on 10/01/2017       Hepatocellular carcinoma ([place])   09/16/2017 Imaging    09/16/2017 Abdominal US IMPRESSION: 1. At least 2 large hepatic masses, both located within the RIGHT liver lobe, measuring 8 cm and 7.3 cm respectively. These are highly suspicious for neoplastic masses. If/when patient is clinically stable and able to follow directions and hold their breath (preferably as an outpatient) further evaluation with dedicated liver MRI should be considered. 2. Suspect underlying liver cirrhosis. 3. Small amount of ascites within the RIGHT upper quadrant. 4. Cholelithiasis without convincing evidence of acute cholecystitis. No bile duct dilatation.    09/16/2017 - 09/22/2017 Hospital Admission    Admit date: 09/16/2017 Admission diagnosis: Devin Reeves Additional comments: He was hospitalized on 09/16/2017 for weakness and fever, and was treated empirically for possible pneumonia and presumed sepsis. Abdominal US showed multiple liver lesions suspicious of malignancy and Dr. Alen Blew was consulted. Patient was discharged on 09/22/2017.     09/17/2017 Imaging    09/17/2017 CT CAP IMPRESSION: 1. Large malignant-appearing mass in the right lobe of the liver measuring 7.5 x 10.0 x 12.7 cm. Given the underlying cirrhosis, this presumably reflects a primary hepatocellular carcinoma. 2. 1.3 x 0.7 x 1.3 cm nodule in the left upper lobe posteriorly. The possibility of a metastatic lesion should be considered, however, this may alternatively reflect an infectious or inflammatory nodule. Close attention on future follow-up  imaging is recommended. 3. Small to moderate volume of ascites. 4. Moderate to large bilateral pleural effusions lying dependently with associated passive subsegmental atelectasis in the lower lobes of the lungs bilaterally. 5. Mild cardiomegaly. 6. Aortic atherosclerosis, in addition to left main and 3 vessel coronary artery disease. Assessment for potential risk factor modification, dietary therapy or pharmacologic therapy may be warranted, if clinically indicated. 7. There are calcifications of the aortic valve and mitral annulus. Echocardiographic correlation for evaluation of potential valvular dysfunction may be warranted if clinically indicated. 8. Colonic diverticulosis. 9. Additional incidental findings, as above.     09/18/2017 Procedure    09/18/2017 Thoracocentesis  IMPRESSION: Successful ultrasound guided right thoracentesis yielding 680 mL of pleural fluid.    09/18/2017 Pathology Results    Pathology 09/18/2017 Cytology Diagnosis PLEURAL FLUID, RIGHT (SPECIMEN 1 OF 1, COLLECTED ON 09/18/17): NO MALIGNANT CELLS IDENTIFIED.    09/18/2017 Tumor Marker    Tumor Markers AFP Results for Devin Reeves (MRN [id number]) as of 09/30/2017 11:42  Ref. Range 09/18/2017 14:34  AFP, Serum, Tumor Marker Latest Ref Range: 0.0 - 8.3 ng/mL 78.6 (H)       09/20/2017 Imaging    09/20/2017 MRI Liver IMPRESSION: 1. Again demonstrated is a large hepatic mass with intravascular invasion involving the hepatic veins, IVC and portal vein. This remains most consistent with hepatocellular carcinoma. 2. Generalized soft tissue edema with ascites and bilateral pleural effusions. Right pleural effusion has decreased in volume from previous CT. 3. Underlying hepatic cirrhosis with multiple portosystemic collaterals consistent with portal hypertension.    09/30/2017 Initial Diagnosis    Hepatocellular carcinoma ([place])    10/01/2017 Cancer Staging    Staging form: Liver, AJCC 8th Edition -  Clinical stage from 10/01/2017: Stage IVB (cT4, cN0, cM1) - Signed by Truitt Merle, MD on 10/01/2017   CURRENT THERAPY: atezolizumab 1200 mg and bevacizumab 15 mg/kg q3 weeks, began 10/05/17   INTERVAL HISTORY: Devin Reeves returns for follow up and cycle 2 as scheduled. He had lab and f/u with me on 9/17 but cancelled. He developed fever 101.9 and went to ED on 9/18, cultures negative; CXR showed hazy opacity at left base consistent with small layering left pleural effusion which was unchanged, and probable left basilar atelectasis and/or pneumonia, but no new abnormality. Dr. Alen Blew on call med onc was consulted and antibiotics were not recommended  Today he reports recurrent daily low grade fever around 100.9. Cough over 3-4 weeks remains dry, stable; no chest pain or dyspnea. He has small but frequent BM 4-5 times per day, with soft stool. Stopped lactulose recently due to diarrhea. No bleeding. His abdominal bloating is better lately, denies abd pain. Denies dysuria. He is on lasix 40 mg daily. Wife feels he is getting worse with fatigue and confusion. His activity level is low, he rests much of the day. He has couple superficial skin tears on his arms that are not healing. Denies bleeding. Requires assistance with all ADLs which his wife performs. She cancelled home health to due too much interruption and chaos at home. He was seen by Roosevelt Locks, NP this week.    MEDICAL HISTORY:  Past Medical History:  Diagnosis Date  . Acne rosacea   . Hyperlipidemia   . Hypertension   . Liver cirrhosis, alcoholic ([place])   . Nocturia   . Skin cancer     SURGICAL HISTORY: Past Surgical History:  Procedure Laterality Date  . prostectomy  2012  . TONSILLECTOMY      I have reviewed the social history and family history with the patient and they are unchanged from previous note.  ALLERGIES:  is allergic to oysters [shellfish allergy]; doxycycline; and hydroxyzine hcl.  MEDICATIONS:  Current Outpatient  Medications  Medication Sig Dispense Refill  . Ascorbic Acid (VITAMIN C) 100 MG tablet Take 300 mg by mouth daily.    . feeding supplement, ENSURE ENLIVE, (ENSURE ENLIVE) LIQD Take 237 mLs by mouth 2 (two) times daily between meals. 237 mL 2  . furosemide (LASIX) 40 MG tablet Take 1 tablet (40 mg total) by mouth 2 (two) times daily. (Patient taking differently: Take 80 mg by mouth daily. ) 60 tablet 0  . potassium chloride SA (K-DUR,KLOR-CON) 20 MEQ tablet Take 2 tablets (40 mEq total) by mouth daily. 30 tablet 0  . triamcinolone cream (KENALOG) 0.1 % Apply 1 application topically 2 (two) times daily.    Marland Kitchen acetaminophen (TYLENOL) 500 MG tablet Take 1,000 mg by mouth daily as needed for fever.    Marland Kitchen amoxicillin-clavulanate (AUGMENTIN) 875-125 MG tablet Take 1 tablet by mouth 2 (two) times daily. 10 tablet 0  . lactulose (CHRONULAC) 10 GM/15ML solution Take 30 mLs by mouth 3 (three) times daily.  3   No current facility-administered medications for this visit.     PHYSICAL EXAMINATION: ECOG PERFORMANCE STATUS: 2-3  Vitals:   10/25/17 0913  BP: (!) 132/56  Pulse: 66  Resp: 17  Temp: 97.6 F (36.4 C)  SpO2: 98%   Filed Weights   10/25/17 0913  Weight: 152 lb 6.4 oz (69.1 kg)    GENERAL:alert, no distress and comfortable SKIN:poor skin turgor, generalized bruising on arms bilaterally, superficial skin tear to forearms bilat, one on  each arm No rash EYES: sclera clear OROPHARYNX:no thrush or ulcers LYMPH:  no palpable cervical lymphadenopathy LUNGS: clear to auscultation with normal breathing effort HEART: regular rate & rhythm, mild bilateral lower extremity edema ABDOMEN:abdomen soft, non-tender and normal bowel sounds. Mild distention and hepatomegaly  Musculoskeletal: no cyanosis of digits and no clubbing  NEURO: alert & oriented x 3 with fluent speech, presents in wheelchair   LABORATORY DATA:  I have reviewed the data as listed CBC Latest Ref Rng & Units 10/25/2017 10/16/2017  10/01/2017  WBC 4.0 - 10.3 K/uL 5.8 5.0 6.8  Hemoglobin 13.0 - 17.1 g/dL 14.2 12.8(L) 13.0  Hematocrit 38.4 - 49.9 % 41.5 37.8(L) 38.8  Platelets 140 - 400 K/uL 157 200 194     CMP Latest Ref Rng & Units 10/25/2017 10/16/2017 10/01/2017  Glucose 70 - 99 mg/dL 128(H) 111(H) 121(H)  BUN 8 - 23 mg/dL 18 20 19   Creatinine 0.61 - 1.24 mg/dL 0.72 0.61 0.76  Sodium 135 - 145 mmol/L 135 139 135  Potassium 3.5 - 5.1 mmol/L 4.6 4.7 4.3  Chloride 98 - 111 mmol/L 103 104 100  CO2 22 - 32 mmol/L 25 28 29   Calcium 8.9 - 10.3 mg/dL 9.2 8.6(L) 9.1  Total Protein 6.5 - 8.1 g/dL 6.8 6.0(L) 6.8  Total Bilirubin 0.3 - 1.2 mg/dL 2.4(H) 1.2 1.5(H)  Alkaline Phos 38 - 126 U/L 272(H) 225(H) 266(H)  AST 15 - 41 U/L 115(H) 107(H) 109(H)  ALT 0 - 44 U/L 153(H) 140(H) 146(H)      RADIOGRAPHIC STUDIES: I have personally reviewed the radiological images as listed and agreed with the findings in the report. No results found.   ASSESSMENT & PLAN: Devin Reeves is a 77 y.o. male with history of HTN and HLD, heavy smoking and alcohol abuse history, presented with 2 months history of leg swelling, abdominal discomfort, weakness and intermittent confusion.  1. 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 He tolerated fairly well, but activity level remains low. His wife feels his fatigue and confusion are worse.  -He has been having low grade fever and frequent BM, Cdif test was ordered but could not produce a sample. He will take the test home with him and return.  -Labs reviewed, CBC stable. CMP shows hyperbilirubinemia 2.4, stable transaminiases. Proceed with cycle 2 tecentriq and avastin today -will follow closely with lab and f/u in 1 week.  -if he continues to tolerate, will continue treatment and restage after 3-4 cycles,  family agrees.   2.Fever and recent pneumonia  -He initially presented with fever and bilateral effusion, treated with empiric antibiotics ceftriaxone and azithromycin for pneumonia,completed 5day course -all cultures negative to date -He had recurrent fever 101.9 and went to ED on 9/18, cultures negative; CXR showed hazy opacity at left base consistent with small layering left pleural effusion which was unchanged, and probable left basilar atelectasis and/or pneumonia, but no new abnormality. Dr. Alen Blew on call med onc was consulted and antibiotics were not recommended -will check stool to r/o c.dif infection. He has mild ascites, but no abdominal pain, likely not peritonitis  -given recurrent low grade fever this week, and high risk for infection, and recent chest xray, will treat with broad spec abx augmentin x5 days, called in to pharmacy.  3. Pleural effusions and leg edema - likely secondary to  liver cirrhosis, worsened by hypoalbuminemia from Malignancy -2-D echocardiogram with EF of 50-55%, unable to assess diastolic parameters -continue Lasix 40 mg twice a day with potassium, currently taking once daily as of 9/27  - Underwent thoracentesis on 8/21, which yielded 680 cc and cytology negative for malignancy  4. Liver cirrhosis, child Pugh class B,  likely related to alcohol, portal hypertension and metabolic encephalopathy - pt has a heavy alcohol history when he was younger, only occasional alcohol now.  -Lasix twice a dayat DC, hepatitis serology negative -currently takes lasix 40 mg daily  5. Generalized weakness - multifactorial -home health services set up, family concerned about need for more help, case manager consult requested -his performance status remains low. Wife cancelled home health for now as it was causing interruptions at home. She performs all care. She knows to reach out when services are needed.   6. Cognitive decline - B12, TSH unremarkable  7.  Goal of care discussion  -goal of therapy is palliative  -patient elects DNR  Plan -Labs reviewed, OK to proceed with cycle 2 atezolizumab and bevacizumab  -lab, f/u in 1 week  -unable to do c.dif test today, will take supplies home -will treat empirically for pneumonia, augmentin called to pharmacy x5 days   -patient elects DNR, will sign form -restage after 3-4 treatment cycles   Orders Placed This Encounter  Procedures  . C difficile quick screen w PCR reflex    Standing Status:   Future    Standing Expiration Date:   10/26/2018   All questions were answered. The patient knows to call the clinic with any problems, questions or concerns. No barriers to learning was detected.     Devin Feeling, NP 10/25/17   Addendum  I have seen the patient, examined him. I agree with the assessment and and plan and have edited the notes.   Devin Reeves returns today for second cycle bevacizumab and atezolizumab.  I would first cycle very well, without noticeable side effects.  However he has developed persistent fever 10 days ago, he went to emergency room on September 1829, UA was negative, blood culture negative and chest x-ray was unremarkable.  He also reports loose bowel movement, 4-5 times a day, he is not taking lactulose due to the diarrhea. Will try to get his stool sample to check c-diff. [place], no leukocytosis, CBC normal, transaminitis stable, total bilirubin slightly worse. Will proceed to cycle 2 treatment today.  I will give him a course of antibiotics to see if his fever improves. Will see him back next week.   We again discussed the goal of his cancer treatment is palliative, if he does not respond to current regimen, will likely recommend hospice.  He agrees with DNR/DNI.   Truitt Merle  10/25/2017

## 2017-10-29 ENCOUNTER — Inpatient Hospital Stay: Payer: Medicare Other | Attending: Hematology

## 2017-10-29 ENCOUNTER — Encounter: Payer: Self-pay | Admitting: Pharmacy Technician

## 2017-10-29 DIAGNOSIS — R509 Fever, unspecified: Secondary | ICD-10-CM

## 2017-10-29 DIAGNOSIS — Z5112 Encounter for antineoplastic immunotherapy: Secondary | ICD-10-CM | POA: Insufficient documentation

## 2017-10-29 DIAGNOSIS — K7460 Unspecified cirrhosis of liver: Secondary | ICD-10-CM | POA: Insufficient documentation

## 2017-10-29 DIAGNOSIS — R531 Weakness: Secondary | ICD-10-CM | POA: Insufficient documentation

## 2017-10-29 DIAGNOSIS — Z23 Encounter for immunization: Secondary | ICD-10-CM | POA: Insufficient documentation

## 2017-10-29 DIAGNOSIS — J90 Pleural effusion, not elsewhere classified: Secondary | ICD-10-CM | POA: Insufficient documentation

## 2017-10-29 DIAGNOSIS — Z79899 Other long term (current) drug therapy: Secondary | ICD-10-CM | POA: Insufficient documentation

## 2017-10-29 DIAGNOSIS — C220 Liver cell carcinoma: Secondary | ICD-10-CM | POA: Insufficient documentation

## 2017-10-29 DIAGNOSIS — K766 Portal hypertension: Secondary | ICD-10-CM | POA: Insufficient documentation

## 2017-10-29 DIAGNOSIS — I10 Essential (primary) hypertension: Secondary | ICD-10-CM | POA: Insufficient documentation

## 2017-10-29 NOTE — Progress Notes (Signed)
[place]  Telephone:([phone]) [phone] [phone]  Clinic Follow up Note   Patient Care Team: System, Pcp Not In as PCP - General   Date of Service:  11/01/2017  Chief complaint: Follow up for [facility]  Oncology History   Cancer Staging Hepatocellular carcinoma [facility]) Staging form: Liver, AJCC 8th Edition - Clinical stage from 10/01/2017: Stage IVB (cT4, cN0, cM1) - Signed by Truitt Merle, MD on 10/01/2017       Hepatocellular carcinoma ([place])   09/16/2017 Imaging    09/16/2017 Abdominal US IMPRESSION: 1. At least 2 large hepatic masses, both located within the RIGHT liver lobe, measuring 8 cm and 7.3 cm respectively. These are highly suspicious for neoplastic masses. If/when patient is clinically stable and able to follow directions and hold their breath (preferably as an outpatient) further evaluation with dedicated liver MRI should be considered. 2. Suspect underlying liver cirrhosis. 3. Small amount of ascites within the RIGHT upper quadrant. 4. Cholelithiasis without convincing evidence of acute cholecystitis. No bile duct dilatation.    09/16/2017 - 09/22/2017 Hospital Admission    Admit date: 09/16/2017 Admission diagnosis: [facility] Additional comments: He was hospitalized on 09/16/2017 for weakness and fever, and was treated empirically for possible pneumonia and presumed sepsis. Abdominal US showed multiple liver lesions suspicious of malignancy and Dr. Alen Blew was consulted. Patient was discharged on 09/22/2017.     09/17/2017 Imaging    09/17/2017 CT CAP IMPRESSION: 1. Large malignant-appearing mass in the right lobe of the liver measuring 7.5 x 10.0 x 12.7 cm. Given the underlying cirrhosis, this presumably reflects a primary hepatocellular carcinoma. 2. 1.3 x 0.7 x 1.3 cm nodule in the left upper lobe posteriorly. The possibility of a metastatic lesion should be considered, however, this may alternatively reflect an infectious or inflammatory nodule. Close  attention on future follow-up imaging is recommended. 3. Small to moderate volume of ascites. 4. Moderate to large bilateral pleural effusions lying dependently with associated passive subsegmental atelectasis in the lower lobes of the lungs bilaterally. 5. Mild cardiomegaly. 6. Aortic atherosclerosis, in addition to left main and 3 vessel coronary artery disease. Assessment for potential risk factor modification, dietary therapy or pharmacologic therapy may be warranted, if clinically indicated. 7. There are calcifications of the aortic valve and mitral annulus. Echocardiographic correlation for evaluation of potential valvular dysfunction may be warranted if clinically indicated. 8. Colonic diverticulosis. 9. Additional incidental findings, as above.     09/18/2017 Procedure    09/18/2017 Thoracocentesis  IMPRESSION: Successful ultrasound guided right thoracentesis yielding 680 mL of pleural fluid.    09/18/2017 Pathology Results    Pathology 09/18/2017 Cytology Diagnosis PLEURAL FLUID, RIGHT (SPECIMEN 1 OF 1, COLLECTED ON 09/18/17): NO MALIGNANT CELLS IDENTIFIED.    09/18/2017 Tumor Marker    Tumor Markers AFP Results for Devin Reeves, Devin Reeves (MRN [id number]) as of 09/30/2017 11:42  Ref. Range 09/18/2017 14:34  AFP, Serum, Tumor Marker Latest Ref Range: 0.0 - 8.3 ng/mL 78.0 (H)       09/20/2017 Imaging    09/20/2017 MRI Liver IMPRESSION: 1. Again demonstrated is a large hepatic mass with intravascular invasion involving the hepatic veins, IVC and portal vein. This remains most consistent with hepatocellular carcinoma. 2. Generalized soft tissue edema with ascites and bilateral pleural effusions. Right pleural effusion has decreased in volume from previous CT. 3. Underlying hepatic cirrhosis with multiple portosystemic collaterals consistent with portal hypertension.    09/30/2017 Initial Diagnosis    Hepatocellular carcinoma ([place])    10/01/2017 Cancer Staging  Staging form:  Liver, AJCC 8th Edition - Clinical stage from 10/01/2017: Stage IVB (cT4, cN0, cM1) - Signed by Truitt Merle, MD on 10/01/2017    10/05/2017 -  Chemotherapy    atezolizumab 1200 mg and bevacizumab 15 mg/kg q3 weeks, began 10/05/17      HISTORY OF PRESENTING ILLNESS (According to Dr. Alen Blew on 09/20/2017) I was asked by Dr. Broadus John  to evaluate Devin Reeves for new finding of liver mass in the setting of cirrhosis of the liver.  He is a 77 year old gentleman native of [place] currently resides in this area with his wife.  His children predominate live in [place].  He has a history of hypertension as well as heavy alcohol use in the past who was hospitalized on 09/16/2017 with complaints of fevers and bilateral pulmonary infiltrates as well as effusion.  He was treated empirically for possible pneumonia and presumed sepsis.  He underwent thoracentesis on the right side of the lung which yielded 680 cc with a cytology did not reveal any malignancy.  Imaging study of the chest abdomen and pelvis obtained on 09/17/2017 showed a large hepatic mass measuring 7.5 x 10.0 x 12.7 cm with cirrhosis findings associated with it.  There is a 1.3 x 0.7 x 1.3 cm nodule area of the left upper lobe of the lung was also noted.  Moderate to large bilateral effusion were noted as well.  His alpha-fetoprotein was elevated at 78.6.  MRI of the liver obtained on 09/20/2017 showed the same large hepatic mass with intravascular invasion into the hepatic veins, IVC and portal vein which is consistent with hepatocellular carcinoma.  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  Clinically, he is still quite debilitated although he is eating slightly better.  He still overall weak and reporting his respiratory symptoms slightly improved.  He still has intermittent fevers.  He denies any abdominal discomfort.  He does not report any headaches, blurry vision, syncope or seizures.  Memory issues has been reported by his  family.  Does not report any fevers, chills or sweats. Does not report any cough, wheezing or hemoptysis. Does not report any chest pain, palpitation, orthopnea or leg edema. Does not report any nausea, vomiting or abdominal pain. Does not report any constipation or diarrhea. Does not report any skeletal complaints.   Does not report frequency, urgency or hematuria.  Does not report any skin rashes or lesions. Does not report any heat or cold intolerance.  Does not report any lymphadenopathy or petechiae.  Remaining review of systems is negative.   CURRENT THERAPY: atezolizumab 1200 mg and bevacizumab 15 mg/kg q3 weeks, began 10/05/17    INTERVAL HISTORY:  Devin Reeves is a 77 y.o. male who is here for follow-up and toxicity check. He presents to the clinic today by his wife and daughter. He notes no new changes since his last cycle. He notes he is able to put more energy into exercising. He does isolated upper arm work and balancing on his legs. He ambulates with cane.  His wife notes he had diarrhea for 3-4 days. He was able to get stool sample which she dropped off at the clinic to a nurse on 10/1, but no testing is coming up. The diarrhea stopped on its own. He had abdominal cramps during his diarrhea episode. He has not had black stool.  He notes he is bloated in the abdomen. He has LE swelling but not significant.    REVIEW OF SYSTEMS:   Constitutional:  Denies fevers, chills or abnormal weight loss (+) wheelchair (+) More energy Eyes: Denies blurriness of vision Ears, nose, mouth, throat, and face: Denies mucositis or sore throat Respiratory: Denies cough or wheezes Cardiovascular: Denies palpitation, chest discomfort (+) lower extremity swelling up to his groin (+) compression stocking  Gastrointestinal:  Denies nausea, heartburn or change in bowel habits (+) ascites with bloating (+) recent watery stools  Skin: Denies abnormal skin rashes (+) multiple skin bruises  Lymphatics:  Denies new lymphadenopathy or easy bruising Neurological:Denies numbness, tingling or new weaknesses Behavioral/Psych: Mood is stable, no new changes  All other systems were reviewed with the patient and are negative.  MEDICAL HISTORY:  Past Medical History:  Diagnosis Date  . Acne rosacea   . Hyperlipidemia   . Hypertension   . Liver cirrhosis, alcoholic ([place])   . Nocturia   . Skin cancer     SURGICAL HISTORY: Past Surgical History:  Procedure Laterality Date  . prostectomy  2012  . TONSILLECTOMY      I have reviewed the social history and family history with the patient and they are unchanged from previous note.  ALLERGIES:  is allergic to oysters [shellfish allergy]; doxycycline; and hydroxyzine hcl.  MEDICATIONS:  Current Outpatient Medications  Medication Sig Dispense Refill  . acetaminophen (TYLENOL) 500 MG tablet Take 1,000 mg by mouth daily as needed for fever.    . Ascorbic Acid (VITAMIN C) 100 MG tablet Take 300 mg by mouth daily.    . feeding supplement, ENSURE ENLIVE, (ENSURE ENLIVE) LIQD Take 237 mLs by mouth 2 (two) times daily between meals. 237 mL 2  . furosemide (LASIX) 40 MG tablet Take 1 tablet (40 mg total) by mouth 2 (two) times daily. (Patient taking differently: Take 20 mg by mouth daily. ) 60 tablet 0  . potassium chloride SA (K-DUR,KLOR-CON) 20 MEQ tablet Take 2 tablets (40 mEq total) by mouth daily. 30 tablet 0  . triamcinolone cream (KENALOG) 0.1 % Apply 1 application topically 2 (two) times daily.     Current Facility-Administered Medications  Medication Dose Route Frequency Provider Last Rate Last Dose  . Influenza vac split quadrivalent PF (FLUARIX) injection 0.5 mL  0.5 mL Intramuscular Once Truitt Merle, MD        PHYSICAL EXAMINATION:  ECOG PERFORMANCE STATUS: 3 - Symptomatic, >50% confined to bed  Vitals:   11/01/17 1321  BP: (!) 167/60  Pulse: (!) 59  Resp: 18  Temp: (!) 97.5 F (36.4 C)  SpO2: 99%   Filed Weights   11/01/17 1321    Weight: 158 lb (71.7 kg)    GENERAL:alert, no distress and comfortable SKIN: skin color, texture, turgor are normal, no rashes or significant lesions (+) multiple skin bruises  EYES: normal, Conjunctiva are pink and non-injected, sclera clear OROPHARYNX:no exudate, no erythema and lips, buccal mucosa, and tongue normal  NECK: supple, thyroid normal size, non-tender, without nodularity LYMPH:  no palpable lymphadenopathy in the cervical, axillary or inguinal LUNGS: clear to auscultation and percussion with normal breathing effort (+) diminished air entry at lung bases likely due to pleural effusion  HEART: regular rate & rhythm (+) Mild lower extremity edema b/l (+) heart murmur ABDOMEN:abdomen soft, non-tender and normal bowel sounds (+) liver 10 cm below costal margin (+) distended abdomen, with ascites Musculoskeletal:no cyanosis of digits and no clubbing  NEURO: alert & oriented x 3 with fluent speech, no focal motor/sensory deficits Exam was performed on a wheelchair today.  LABORATORY DATA:  I have  reviewed the data as listed CBC Latest Ref Rng & Units 11/01/2017 10/25/2017 10/16/2017  WBC 4.0 - 10.3 K/uL 4.6 5.8 5.0  Hemoglobin 13.0 - 17.1 g/dL 13.1 14.2 12.8(L)  Hematocrit 38.4 - 49.9 % 38.9 41.5 37.8(L)  Platelets 140 - 400 K/uL 165 157 200     CMP Latest Ref Rng & Units 11/01/2017 10/25/2017 10/16/2017  Glucose 70 - 99 mg/dL 128(H) 128(H) 111(H)  BUN 8 - 23 mg/dL 15 18 20   Creatinine 0.61 - 1.24 mg/dL 0.68 0.72 0.61  Sodium 135 - 145 mmol/L 139 135 139  Potassium 3.5 - 5.1 mmol/L 4.2 4.6 4.7  Chloride 98 - 111 mmol/L 105 103 104  CO2 22 - 32 mmol/L 29 25 28   Calcium 8.9 - 10.3 mg/dL 8.5(L) 9.2 8.6(L)  Total Protein 6.5 - 8.1 g/dL 6.1(L) 6.8 6.0(L)  Total Bilirubin 0.3 - 1.2 mg/dL 2.1(H) 2.4(H) 1.2  Alkaline Phos 38 - 126 U/L 293(H) 272(H) 225(H)  AST 15 - 41 U/L 101(H) 115(H) 107(H)  ALT 0 - 44 U/L 111(H) 153(H) 140(H)   Tumor Markers AFP  Results for Devin Reeves, Devin Reeves  (MRN [id number]) as of 10/29/2017 13:38  Ref. Range 09/18/2017 14:34 10/01/2017 09:58  AFP, Serum, Tumor Marker Latest Ref Range: 0.0 - 8.3 ng/mL 78.0 (H) 86.4 (H)  PENDING for 11/01/17   Procedures  09/18/2017 Thoracocentesis  IMPRESSION: Successful ultrasound guided right thoracentesis yielding 680 mL of pleural fluid.   Pathology 09/18/2017 Cytology Diagnosis PLEURAL FLUID, RIGHT (SPECIMEN 1 OF 1, COLLECTED ON 09/18/17): NO MALIGNANT CELLS IDENTIFIED.    RADIOGRAPHIC STUDIES: I have personally reviewed the radiological images as listed and agreed with the findings in the report.  09/20/2017 MRI Liver IMPRESSION: 1. Again demonstrated is a large hepatic mass with intravascular invasion involving the hepatic veins, IVC and portal vein. This remains most consistent with hepatocellular carcinoma. 2. Generalized soft tissue edema with ascites and bilateral pleural effusions. Right pleural effusion has decreased in volume from previous CT. 3. Underlying hepatic cirrhosis with multiple portosystemic collaterals consistent with portal hypertension.  09/17/2017 CT CAP IMPRESSION: 1. Large malignant-appearing mass in the right lobe of the liver measuring 7.5 x 10.0 x 12.7 cm. Given the underlying cirrhosis, this presumably reflects a primary hepatocellular carcinoma. 2. 1.3 x 0.7 x 1.3 cm nodule in the left upper lobe posteriorly. The possibility of a metastatic lesion should be considered, however, this may alternatively reflect an infectious or inflammatory nodule. Close attention on future follow-up imaging is recommended. 3. Small to moderate volume of ascites. 4. Moderate to large bilateral pleural effusions lying dependently with associated passive subsegmental atelectasis in the lower lobes of the lungs bilaterally. 5. Mild cardiomegaly. 6. Aortic atherosclerosis, in addition to left main and 3 vessel coronary artery disease. Assessment for potential risk factor modification,  dietary therapy or pharmacologic therapy may be warranted, if clinically indicated. 7. There are calcifications of the aortic valve and mitral annulus. Echocardiographic correlation for evaluation of potential valvular dysfunction may be warranted if clinically indicated. 8. Colonic diverticulosis. 9. Additional incidental findings, as above.  09/16/2017 Abdominal US IMPRESSION: 1. At least 2 large hepatic masses, both located within the RIGHT liver lobe, measuring 8 cm and 7.3 cm respectively. These are highly suspicious for neoplastic masses. If/when patient is clinically stable and able to follow directions and hold their breath (preferably as an outpatient) further evaluation with dedicated liver MRI should be considered. 2. Suspect underlying liver cirrhosis. 3. Small amount of ascites within the RIGHT upper quadrant.  4. Cholelithiasis without convincing evidence of acute cholecystitis. No bile duct dilatation.   ASSESSMENT & PLAN:  Devin Reeves is a 77 y.o. male with history of HTN and HLD, heavy smoking and alcohol abuse history, presented with 2 months history of leg swelling, abdominal discomfort, weakness and intermittent confusion.  1. 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 Image showed evidence of liver cirrhosis with portal hypertension, and a large hepatic mass measuring 7.5 x 10.0 x 12.7 cm, a 1.3 x 0.7 x 1.3 cm with intravascular invasion typical image findings of hepatocellular carcinoma, a nodule area of the left upper lobe of the lung which is suspicious for metastasis. I personally reviewed his image with patient and his family members in details.  -Given his typical image findings, and  elevated AFP, he does not need tissue biopsy to confirm the diagnosis. Hep C serology negative -We previously discussed that his liver is not curable at this stage, and the goal of therapy is palliative. -Given the probable lung metastasis, he is not a candidate for liver targeted therapy -Discussed systemic options, including oral TKIs such as sorafenib, lenvatinib, cabozantinib, regorafenib, immunotherapy, and the recently new data of atezolizumab and bevacizumab in [facility] which showed great response (up to 60-70% partial response). -Given his decompensated liver function, with child Pugh score 9 (class B), and overall very poor performance status, in may not be able to tolerate sorafenib or Lenvatinib  -I previously recommended him to start with atezo and beva if I am able to get his insurance to approve it, or get free drug replacement, due to higher response rate and overall good tolerance. I discussed the potential side effects in great detail with pt. -The goal of therapy is palliative, to prolong his life and improve his quality of life. -We also discussed palliative care alone, due to his advanced age, poor performance status, and severely decompensated liver cirrhosis.  His overall prognosis is guarded, expected life expectancy is likely a few months to several months. After lengthy discussion, patient and his family members would like him to try atezo and beva first  -He started Atexo 1200mg  and Beva 15 mg/kg q3 weeks on 10/05/17. He tolerated fairly well, but activity level remains low. His wife feels his fatigue and confusion were worse after the first treatment.  -Labs reviewed, Lymphocytes at 0.8, Ca at 8.5, LFTs elevated. AFP still pending. He tolerated the second cycle treatment well last week -He is overall doing better with treatment. I advised him to remain active but not to over exert himself and to avoid injury as he can easily bruise from his liver disease.  -He is interested in flu  shot, will be given today.  -F/u in 2 weeks  2. Fever and recent pneumonia  -He initially presented with fever and bilateral effusion, treated with empiric antibiotics for pneumonia, completed 5 day course -all cultures negative -No recurrent fever lately -He had recurrent fever 101.9 and went to ED on 9/18, cultures negative; CXR showed hazy opacity at left base consistent with small layering left pleural effusion which was unchanged, and probable left basilar atelectasis and/or pneumonia, but no new abnormality. Dr. Alen Blew on call med onc was consulted and antibiotics were not recommended -Patient's wife notes she dropped off a stool sample during  his diarrhea episode on 10/1 in the afternoon. No lab test for this was indicated, I will confirm.  -Given recurrent low grade fever, and high risk for infection, and recent chest xray, we treated with broad spec abx Augmentin x5 days starting 10/25/17. -fever resolved now     3. Pleural effusions and leg edema - likely secondary to liver cirrhosis, worsened by hypoalbuminemia from Malignancy -2-D echocardiogram with EF of 50-55%, unable to assess diastolic parameters -continue Lasix 40 mg twice a day with potassium -Underwent thoracentesis on 8/21, which yielded 680 cc and cytology negative for malignancy -improved and stable  4. 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  -Lasix twice a dayat DC, hepatitis serology negative -He is not constipated, has bowel movement 2-3 times a day, not on lactulose -I previously refferred him to liver clinic NP Dawn Drazak to manage his liver cirrhosis  5. Generalized weakness  -multifactorial -improving with diuresis -PT eval completed, home health PT recommended -home health services set up, family concerned about need for more  help, case manager consult requested  6. Cognitive decline -Family reports progressive symptoms of memory loss for past several months.  -B12, TSH unremarkable  7.Goal of care discussion  -We again discussed the incurable nature of his cancer, and the overall poor prognosis, especially if he does not have good response to systemic therapy -The patient understands the goal of care is palliative. -I recommend DNR/DNI, he will think about it    Plan -Labs reviewed, he is clinically stable, fever resolved -Flu shot today  -F/u with me in 2 weeks for cycle 3 treatment, plan to repeat staging scan after next cycle     All questions were answered. The patient knows to call the clinic with any problems, questions or concerns. No barriers to learning was detected. I spent 15 minutes counseling the patient face to face. The total time spent in the appointment was 20 minutes and more than 50% was on counseling and review of test results  I, Joslyn Devon, am acting as scribe for Truitt Merle, MD.   I have reviewed the above documentation for accuracy and completeness, and I agree with the above.      Truitt Merle, MD 11/01/2017

## 2017-10-29 NOTE — Progress Notes (Addendum)
The patient is approved for drug assistance by [place] for Avastin and Tecentriq until 10/30/19. Enrollment is based on insurance denial. Drug will be requested for DOS begining 10/09/17.

## 2017-11-01 ENCOUNTER — Inpatient Hospital Stay: Payer: Medicare Other

## 2017-11-01 ENCOUNTER — Inpatient Hospital Stay (HOSPITAL_BASED_OUTPATIENT_CLINIC_OR_DEPARTMENT_OTHER): Payer: Medicare Other | Admitting: Hematology

## 2017-11-01 VITALS — BP 167/60 | HR 59 | Temp 97.5°F | Resp 18 | Ht 68.0 in | Wt 158.0 lb

## 2017-11-01 DIAGNOSIS — I10 Essential (primary) hypertension: Secondary | ICD-10-CM

## 2017-11-01 DIAGNOSIS — R531 Weakness: Secondary | ICD-10-CM | POA: Diagnosis not present

## 2017-11-01 DIAGNOSIS — K766 Portal hypertension: Secondary | ICD-10-CM

## 2017-11-01 DIAGNOSIS — Z23 Encounter for immunization: Secondary | ICD-10-CM | POA: Diagnosis not present

## 2017-11-01 DIAGNOSIS — Z5112 Encounter for antineoplastic immunotherapy: Secondary | ICD-10-CM | POA: Diagnosis present

## 2017-11-01 DIAGNOSIS — J90 Pleural effusion, not elsewhere classified: Secondary | ICD-10-CM | POA: Diagnosis not present

## 2017-11-01 DIAGNOSIS — C220 Liver cell carcinoma: Secondary | ICD-10-CM | POA: Diagnosis not present

## 2017-11-01 DIAGNOSIS — K7460 Unspecified cirrhosis of liver: Secondary | ICD-10-CM | POA: Diagnosis not present

## 2017-11-01 DIAGNOSIS — Z79899 Other long term (current) drug therapy: Secondary | ICD-10-CM | POA: Diagnosis not present

## 2017-11-01 LAB — CBC WITH DIFFERENTIAL (CANCER CENTER ONLY)
BASOS PCT: 2 %
Basophils Absolute: 0.1 10*3/uL (ref 0.0–0.1)
EOS ABS: 0.2 10*3/uL (ref 0.0–0.5)
EOS PCT: 4 %
HCT: 38.9 % (ref 38.4–49.9)
Hemoglobin: 13.1 g/dL (ref 13.0–17.1)
Lymphocytes Relative: 17 %
Lymphs Abs: 0.8 10*3/uL — ABNORMAL LOW (ref 0.9–3.3)
MCH: 34.5 pg — ABNORMAL HIGH (ref 27.2–33.4)
MCHC: 33.7 g/dL (ref 32.0–36.0)
MCV: 102.4 fL — ABNORMAL HIGH (ref 79.3–98.0)
MONO ABS: 0.5 10*3/uL (ref 0.1–0.9)
MONOS PCT: 11 %
Neutro Abs: 3 10*3/uL (ref 1.5–6.5)
Neutrophils Relative %: 66 %
PLATELETS: 165 10*3/uL (ref 140–400)
RBC: 3.8 MIL/uL — ABNORMAL LOW (ref 4.20–5.82)
RDW: 15.5 % — AB (ref 11.0–14.6)
WBC Count: 4.6 10*3/uL (ref 4.0–10.3)

## 2017-11-01 LAB — CMP (CANCER CENTER ONLY)
ALBUMIN: 2 g/dL — AB (ref 3.5–5.0)
ALK PHOS: 293 U/L — AB (ref 38–126)
ALT: 111 U/L — AB (ref 0–44)
ANION GAP: 5 (ref 5–15)
AST: 101 U/L — AB (ref 15–41)
BILIRUBIN TOTAL: 2.1 mg/dL — AB (ref 0.3–1.2)
BUN: 15 mg/dL (ref 8–23)
CALCIUM: 8.5 mg/dL — AB (ref 8.9–10.3)
CO2: 29 mmol/L (ref 22–32)
CREATININE: 0.68 mg/dL (ref 0.61–1.24)
Chloride: 105 mmol/L (ref 98–111)
GFR, Est AFR Am: >60 mL/min (ref >60)
GFR, Estimated: >60 mL/min (ref >60)
GLUCOSE: 128 mg/dL — AB (ref 70–99)
Potassium: 4.2 mmol/L (ref 3.5–5.1)
Sodium: 139 mmol/L (ref 135–145)
TOTAL PROTEIN: 6.1 g/dL — AB (ref 6.5–8.1)

## 2017-11-01 MED ORDER — INFLUENZA VAC SPLIT QUAD 0.5 ML IM SUSY
0.5000 mL | PREFILLED_SYRINGE | Freq: Once | INTRAMUSCULAR | Status: AC
  Administered 2017-11-01: 0.5 mL via INTRAMUSCULAR

## 2017-11-01 MED ORDER — INFLUENZA VAC SPLIT QUAD 0.5 ML IM SUSY
PREFILLED_SYRINGE | INTRAMUSCULAR | Status: AC
  Filled 2017-11-01: qty 0.5

## 2017-11-02 ENCOUNTER — Encounter: Payer: Self-pay | Admitting: Hematology

## 2017-11-02 LAB — AFP TUMOR MARKER: AFP, Serum, Tumor Marker: 85.8 ng/mL — ABNORMAL HIGH (ref 0.0–8.3)

## 2017-11-04 ENCOUNTER — Telehealth: Payer: Self-pay | Admitting: Hematology

## 2017-11-04 NOTE — Telephone Encounter (Signed)
No los 10/4 

## 2017-11-14 NOTE — Progress Notes (Signed)
[place]  [phone] [phone]  Clinic Follow up Note   Patient Care Team: System, Pcp Not In as PCP - General 11/15/2017  SUMMARY OF ONCOLOGIC HISTORY: Oncology History   Cancer Staging Hepatocellular carcinoma [facility]) Staging form: Liver, AJCC 8th Edition - Clinical stage from 10/01/2017: Stage IVB (cT4, cN0, cM1) - Signed by Truitt Merle, MD on 10/01/2017       Hepatocellular carcinoma ([place])   09/16/2017 Imaging    09/16/2017 Abdominal US IMPRESSION: 1. At least 2 large hepatic masses, both located within the RIGHT liver lobe, measuring 8 cm and 7.3 cm respectively. These are highly suspicious for neoplastic masses. If/when patient is clinically stable and able to follow directions and hold their breath (preferably as an outpatient) further evaluation with dedicated liver MRI should be considered. 2. Suspect underlying liver cirrhosis. 3. Small amount of ascites within the RIGHT upper quadrant. 4. Cholelithiasis without convincing evidence of acute cholecystitis. No bile duct dilatation.    09/16/2017 - 09/22/2017 Hospital Admission    Admit date: 09/16/2017 Admission diagnosis: [facility] Additional comments: He was hospitalized on 09/16/2017 for weakness and fever, and was treated empirically for possible pneumonia and presumed sepsis. Abdominal US showed multiple liver lesions suspicious of malignancy and Dr. Alen Blew was consulted. Patient was discharged on 09/22/2017.     09/17/2017 Imaging    09/17/2017 CT CAP IMPRESSION: 1. Large malignant-appearing mass in the right lobe of the liver measuring 7.5 x 10.0 x 12.7 cm. Given the underlying cirrhosis, this presumably reflects a primary hepatocellular carcinoma. 2. 1.3 x 0.7 x 1.3 cm nodule in the left upper lobe posteriorly. The possibility of a metastatic lesion should be considered, however, this may alternatively reflect an infectious or inflammatory nodule. Close attention on future follow-up  imaging is recommended. 3. Small to moderate volume of ascites. 4. Moderate to large bilateral pleural effusions lying dependently with associated passive subsegmental atelectasis in the lower lobes of the lungs bilaterally. 5. Mild cardiomegaly. 6. Aortic atherosclerosis, in addition to left main and 3 vessel coronary artery disease. Assessment for potential risk factor modification, dietary therapy or pharmacologic therapy may be warranted, if clinically indicated. 7. There are calcifications of the aortic valve and mitral annulus. Echocardiographic correlation for evaluation of potential valvular dysfunction may be warranted if clinically indicated. 8. Colonic diverticulosis. 9. Additional incidental findings, as above.     09/18/2017 Procedure    09/18/2017 Thoracocentesis  IMPRESSION: Successful ultrasound guided right thoracentesis yielding 680 mL of pleural fluid.    09/18/2017 Pathology Results    Pathology 09/18/2017 Cytology Diagnosis PLEURAL FLUID, RIGHT (SPECIMEN 1 OF 1, COLLECTED ON 09/18/17): NO MALIGNANT CELLS IDENTIFIED.    09/18/2017 Tumor Marker    Tumor Markers AFP Results for GEVORG, BRUM (MRN [id number]) as of 09/30/2017 11:42  Ref. Range 09/18/2017 14:34  AFP, Serum, Tumor Marker Latest Ref Range: 0.0 - 8.3 ng/mL 78.6 (H)       09/20/2017 Imaging    09/20/2017 MRI Liver IMPRESSION: 1. Again demonstrated is a large hepatic mass with intravascular invasion involving the hepatic veins, IVC and portal vein. This remains most consistent with hepatocellular carcinoma. 2. Generalized soft tissue edema with ascites and bilateral pleural effusions. Right pleural effusion has decreased in volume from previous CT. 3. Underlying hepatic cirrhosis with multiple portosystemic collaterals consistent with portal hypertension.    09/30/2017 Initial Diagnosis    Hepatocellular carcinoma ([place])    10/01/2017 Cancer Staging    Staging form: Liver, AJCC 8th Edition -  Clinical stage from 10/01/2017: Stage IVB (cT4, cN0, cM1) - Signed by Truitt Merle, MD on 10/01/2017    10/05/2017 -  Chemotherapy    atezolizumab 1200 mg and bevacizumab 15 mg/kg q3 weeks, began 10/05/17    CURRENT THERAPY: atezolizumab 1200 mg and bevacizumab 15 mg/kg q3 weeks, began 10/05/17  INTERVAL HISTORY: Mr. Feagans returns with his wife for f/u and cycle 3 as scheduled. She completed cycle 2 tecentriq and avastin on 9/27. He did well with treatment. He continues chair exercises at home BID. He feels he is getting stronger. Appetite is normal. Denies n/v/c/d. Had a large non bloody BM recently. Denies abdominal pain. Distention is stable, not increased or limiting po intake, breathing, or mobility. Leg edema is stable, he wears compression stockings. He has mild cough with clear white sputum that returned over last week or so. Denies fever, chills, chest pain, or dyspnea.    MEDICAL HISTORY:  Past Medical History:  Diagnosis Date  . Acne rosacea   . Hyperlipidemia   . Hypertension   . Liver cirrhosis, alcoholic ([place])   . Nocturia   . Skin cancer     SURGICAL HISTORY: Past Surgical History:  Procedure Laterality Date  . prostectomy  2012  . TONSILLECTOMY      I have reviewed the social history and family history with the patient and they are unchanged from previous note.  ALLERGIES:  is allergic to oysters [shellfish allergy]; doxycycline; and hydroxyzine hcl.  MEDICATIONS:  Current Outpatient Medications  Medication Sig Dispense Refill  . acetaminophen (TYLENOL) 500 MG tablet Take 1,000 mg by mouth daily as needed for fever.    . Ascorbic Acid (VITAMIN C) 100 MG tablet Take 300 mg by mouth daily.    . feeding supplement, ENSURE ENLIVE, (ENSURE ENLIVE) LIQD Take 237 mLs by mouth 2 (two) times daily between meals. 237 mL 2  . furosemide (LASIX) 40 MG tablet Take 1 tablet (40 mg total) by mouth 2 (two) times daily. (Patient taking differently: Take 20 mg by mouth daily. ) 60  tablet 0  . Magnesium 250 MG TABS Take 250 mg by mouth once.    . potassium chloride SA (K-DUR,KLOR-CON) 20 MEQ tablet Take 2 tablets (40 mEq total) by mouth daily. 30 tablet 0  . triamcinolone cream (KENALOG) 0.1 % Apply 1 application topically 2 (two) times daily.    . vitamin E 1000 UNIT capsule Take 1,000 Units by mouth daily.     No current facility-administered medications for this visit.     PHYSICAL EXAMINATION: ECOG PERFORMANCE STATUS: 2 - Symptomatic, <50% confined to bed  Vitals:   11/15/17 1127 11/15/17 1130  BP: (!) 175/67 (!) 175/67  Pulse: 66 66  Resp: 16 16  Temp:  97.6 F (36.4 C)  SpO2: 100% 100%   Filed Weights   11/15/17 1127 11/15/17 1130  Weight: 170 lb 6.4 oz (77.3 kg) 170 lb 4 oz (77.2 kg)    GENERAL:alert, no distress and comfortable SKIN: no rashes or significant lesions EYES: sclera clear OROPHARYNX:no thrush or ulcers LYMPH:  no palpable cervical or supraclavicular lymphadenopathy LUNGS: clear to auscultation with normal breathing effort HEART: regular rate & rhythm, + murmur, mild bilateral lower extremity edema ABDOMEN:abdomen soft, non-tender and normal bowel sounds. Distention consistent with ascites, hepatomegaly  Musculoskeletal:no cyanosis of digits and no clubbing  NEURO: alert & oriented x 3 with fluent speech  LABORATORY DATA:  I have reviewed the data as listed CBC Latest Ref Rng & Units  11/15/2017 11/01/2017 10/25/2017  WBC 4.0 - 10.5 K/uL 5.4 4.6 5.8  Hemoglobin 13.0 - 17.0 g/dL 14.4 13.1 14.2  Hematocrit 39.0 - 52.0 % 42.6 38.9 41.5  Platelets 150 - 400 K/uL 128(L) 165 157     CMP Latest Ref Rng & Units 11/15/2017 11/01/2017 10/25/2017  Glucose 70 - 99 mg/dL 142(H) 128(H) 128(H)  BUN 8 - 23 mg/dL 15 15 18   Creatinine 0.61 - 1.24 mg/dL 0.71 0.68 0.72  Sodium 135 - 145 mmol/L 136 139 135  Potassium 3.5 - 5.1 mmol/L 4.2 4.2 4.6  Chloride 98 - 111 mmol/L 105 105 103  CO2 22 - 32 mmol/L 25 29 25   Calcium 8.9 - 10.3 mg/dL 8.5(L)  8.5(L) 9.2  Total Protein 6.5 - 8.1 g/dL 6.9 6.1(L) 6.8  Total Bilirubin 0.3 - 1.2 mg/dL 2.4(H) 2.1(H) 2.4(H)  Alkaline Phos 38 - 126 U/L 312(H) 293(H) 272(H)  AST 15 - 41 U/L 107(H) 101(H) 115(H)  ALT 0 - 44 U/L 124(H) 111(H) 153(H)      RADIOGRAPHIC STUDIES: I have personally reviewed the radiological images as listed and agreed with the findings in the report. No results found.   ASSESSMENT & PLAN: Nakul Avino is a 77 y.o. male with history of HTN and HLD, heavy smoking and alcohol abuse history, presented with 2 months history of leg swelling, abdominal discomfort, weakness and intermittent confusion.  1. 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 Dr. Alen Blew on call med onc was consulted and antibiotics were not recommended -Patient's wife notes she dropped off a stool sample during his diarrhea episode on 10/1 in the afternoon. No lab test for this was indicated, I will confirm.  -Given recurrent low grade fever, and high risk for infection,and recent chest xray,we treated with broad spec abx Augmentin x5 days starting 10/25/17. -fever resolved now    3. Pleural effusions and leg edema - likely secondary to liver cirrhosis, worsened by hypoalbuminemia from Malignancy -2-D echocardiogram with EF of 50-55%, unable to assess diastolic parameters -continue Lasix 40 mg twice a day with potassium -Underwent thoracentesis on 8/21, which yielded 680 cc and cytology negative for malignancy -improved and stable  4.  Liver cirrhosis, child Pugh class B,  likely related to alcohol, portal hypertension and metabolic encephalopathy -pt has a heavy alcohol history when he was younger, he reports only drinking wine occasionally now.  -Lasix twice a dayat DC, hepatitis serology negative -previously refferred him to liver clinic NP Dawn Drazak to manage his liver cirrhosis  5. Generalized weakness -multifactorial -improving with diuresis  6. Cognitive decline -Family reports progressive symptoms of memory loss for past several months.  -B12, TSH unremarkable  7.Goal of care discussion   Disposition:  Mr. Sane appears stable. He completed cycle 2 atezolizumab and bevacizumab, he is tolerating treatment well overall. His strength is improving. He has no significant toxicities from treatment. Ascites is stable, I do not feel he needs paracentesis at this time. He has recurrent productive cough without fever or other respiratory symptoms. He has had recurrent pneumonia, most recently treated with augmentin 9/27-10/2. WBC is  normal. I favor to monitor carefully for now; if he develops fever, worsening cough, dyspnea, or new respiratory symptoms, will obtain chest xray and bring him in for office visit. He and his spouse agree with the plan. Labs reviewed, mild thrombocytopenia and hyperbilirubinemia, transaminases are stable. Labs adequate to proceed with treatment today. Will obtain restaging CT after this cycle. He will return in 3 weeks for f/u and next cycle, or sooner if needed.   Plan -Labs reviewed, proceed with cycle 3 atezolizumab and bevacizumab today -Restaging after this cycle, ordered today -Monitor cough, call with fever or worsening symptoms  -Lab, f/u in 3 weeks with next cycle and review of scans    Orders Placed This Encounter  Procedures  . CT Abdomen Pelvis W Contrast    Standing Status:   Future    Standing Expiration Date:   11/15/2018    Order Specific Question:   If  indicated for the ordered procedure, I authorize the administration of contrast media per Radiology protocol    Answer:   Yes    Order Specific Question:   Preferred imaging location?    Answer:   [facility]    Order Specific Question:   Is Oral Contrast requested for this exam?    Answer:   Yes, Per Radiology protocol    Order Specific Question:   Radiology Contrast Protocol - do NOT remove file path    Answer:   \\[email or web address]  . CT Chest W Contrast    Standing Status:   Future    Standing Expiration Date:   11/15/2018    Order Specific Question:   If indicated for the ordered procedure, I authorize the administration of contrast media per Radiology protocol    Answer:   Yes    Order Specific Question:   Preferred imaging location?    Answer:   [facility]    Order Specific Question:   Radiology Contrast Protocol - do NOT remove file path    Answer:   \\[email or web address]   All questions were answered. The patient knows to call the clinic with any problems, questions or concerns. No barriers to learning was detected.     Alla Feeling, NP 11/15/17

## 2017-11-15 ENCOUNTER — Inpatient Hospital Stay: Payer: Medicare Other

## 2017-11-15 ENCOUNTER — Encounter: Payer: Self-pay | Admitting: Nurse Practitioner

## 2017-11-15 ENCOUNTER — Inpatient Hospital Stay (HOSPITAL_BASED_OUTPATIENT_CLINIC_OR_DEPARTMENT_OTHER): Payer: Medicare Other | Admitting: Nurse Practitioner

## 2017-11-15 VITALS — BP 175/67 | HR 66 | Temp 97.6°F | Resp 16 | Ht 68.0 in | Wt 170.2 lb

## 2017-11-15 VITALS — BP 146/57 | HR 61 | Resp 20

## 2017-11-15 DIAGNOSIS — R531 Weakness: Secondary | ICD-10-CM | POA: Diagnosis not present

## 2017-11-15 DIAGNOSIS — C220 Liver cell carcinoma: Secondary | ICD-10-CM | POA: Diagnosis not present

## 2017-11-15 DIAGNOSIS — Z5112 Encounter for antineoplastic immunotherapy: Secondary | ICD-10-CM | POA: Diagnosis not present

## 2017-11-15 DIAGNOSIS — J90 Pleural effusion, not elsewhere classified: Secondary | ICD-10-CM

## 2017-11-15 DIAGNOSIS — K7030 Alcoholic cirrhosis of liver without ascites: Secondary | ICD-10-CM

## 2017-11-15 DIAGNOSIS — Z79899 Other long term (current) drug therapy: Secondary | ICD-10-CM

## 2017-11-15 DIAGNOSIS — Z7189 Other specified counseling: Secondary | ICD-10-CM

## 2017-11-15 DIAGNOSIS — I10 Essential (primary) hypertension: Secondary | ICD-10-CM | POA: Diagnosis not present

## 2017-11-15 LAB — CBC WITH DIFFERENTIAL (CANCER CENTER ONLY)
Abs Immature Granulocytes: 0.01 10*3/uL (ref 0.00–0.07)
Basophils Absolute: 0 10*3/uL (ref 0.0–0.1)
Basophils Relative: 1 %
EOS PCT: 6 %
Eosinophils Absolute: 0.3 10*3/uL (ref 0.0–0.5)
HEMATOCRIT: 42.6 % (ref 39.0–52.0)
HEMOGLOBIN: 14.4 g/dL (ref 13.0–17.0)
Immature Granulocytes: 0 %
LYMPHS ABS: 1 10*3/uL (ref 0.7–4.0)
LYMPHS PCT: 19 %
MCH: 35.3 pg — AB (ref 26.0–34.0)
MCHC: 33.8 g/dL (ref 30.0–36.0)
MCV: 104.4 fL — ABNORMAL HIGH (ref 80.0–100.0)
MONO ABS: 0.5 10*3/uL (ref 0.1–1.0)
Monocytes Relative: 10 %
Neutro Abs: 3.5 10*3/uL (ref 1.7–7.7)
Neutrophils Relative %: 64 %
Platelet Count: 128 10*3/uL — ABNORMAL LOW (ref 150–400)
RBC: 4.08 MIL/uL — ABNORMAL LOW (ref 4.22–5.81)
RDW: 15.7 % — ABNORMAL HIGH (ref 11.5–15.5)
WBC Count: 5.4 10*3/uL (ref 4.0–10.5)
nRBC: 0 % (ref 0.0–0.2)

## 2017-11-15 LAB — CMP (CANCER CENTER ONLY)
ALK PHOS: 312 U/L — AB (ref 38–126)
ALT: 124 U/L — ABNORMAL HIGH (ref 0–44)
AST: 107 U/L — AB (ref 15–41)
Albumin: 2.1 g/dL — ABNORMAL LOW (ref 3.5–5.0)
Anion gap: 6 (ref 5–15)
BUN: 15 mg/dL (ref 8–23)
CALCIUM: 8.5 mg/dL — AB (ref 8.9–10.3)
CHLORIDE: 105 mmol/L (ref 98–111)
CO2: 25 mmol/L (ref 22–32)
CREATININE: 0.71 mg/dL (ref 0.61–1.24)
GFR, Est AFR Am: >60 mL/min (ref >60)
Glucose, Bld: 142 mg/dL — ABNORMAL HIGH (ref 70–99)
Potassium: 4.2 mmol/L (ref 3.5–5.1)
SODIUM: 136 mmol/L (ref 135–145)
Total Bilirubin: 2.4 mg/dL — ABNORMAL HIGH (ref 0.3–1.2)
Total Protein: 6.9 g/dL (ref 6.5–8.1)

## 2017-11-15 LAB — TOTAL PROTEIN, URINE DIPSTICK: Protein, ur: NEGATIVE mg/dL

## 2017-11-15 MED ORDER — SODIUM CHLORIDE 0.9 % IV SOLN
Freq: Once | INTRAVENOUS | Status: AC
  Administered 2017-11-15: 14:00:00 via INTRAVENOUS
  Filled 2017-11-15: qty 250

## 2017-11-15 MED ORDER — SODIUM CHLORIDE 0.9 % IV SOLN
14.3000 mg/kg | Freq: Once | INTRAVENOUS | Status: AC
  Administered 2017-11-15: 1000 mg via INTRAVENOUS
  Filled 2017-11-15: qty 32

## 2017-11-15 MED ORDER — SODIUM CHLORIDE 0.9 % IV SOLN
1200.0000 mg | Freq: Once | INTRAVENOUS | Status: AC
  Administered 2017-11-15: 1200 mg via INTRAVENOUS
  Filled 2017-11-15: qty 20

## 2017-11-15 NOTE — Progress Notes (Signed)
Dr. Burr Medico aware of AST, ALT and Bili levels. Ok to treat per Dr. Burr Medico.

## 2017-11-15 NOTE — Patient Instructions (Signed)
[facility] Discharge Instructions for Patients Receiving Chemotherapy  Today you received the following chemotherapy agents Avastin and Tecentriq  To help prevent nausea and vomiting after your treatment, we encourage you to take your nausea medication as directed If you develop nausea and vomiting that is not controlled by your nausea medication, call the clinic.   BELOW ARE SYMPTOMS THAT SHOULD BE REPORTED IMMEDIATELY:  *FEVER GREATER THAN 100.5 F  *CHILLS WITH OR WITHOUT FEVER  NAUSEA AND VOMITING THAT IS NOT CONTROLLED WITH YOUR NAUSEA MEDICATION  *UNUSUAL SHORTNESS OF BREATH  *UNUSUAL BRUISING OR BLEEDING  TENDERNESS IN MOUTH AND THROAT WITH OR WITHOUT PRESENCE OF ULCERS  *URINARY PROBLEMS  *BOWEL PROBLEMS  UNUSUAL RASH Items with * indicate a potential emergency and should be followed up as soon as possible.  Feel free to call the clinic should you have any questions or concerns. The clinic phone number is [phone].  Please show the CHEMO ALERT CARD at check-in to the Emergency Department and triage nurse.   

## 2017-11-18 ENCOUNTER — Telehealth: Payer: Self-pay | Admitting: Hematology

## 2017-11-18 NOTE — Telephone Encounter (Signed)
No los 10/18 

## 2017-11-21 ENCOUNTER — Telehealth: Payer: Self-pay

## 2017-11-21 NOTE — Telephone Encounter (Signed)
Spoke with patient's wife CT scan is schedule for Wednesday 10/30 at 9:30 to arrive by 9:00, NPO 4 hours prior, needs to pick up contrast prior to.  She verbalized an understanding.

## 2017-11-23 ENCOUNTER — Encounter (HOSPITAL_COMMUNITY): Payer: Self-pay | Admitting: Emergency Medicine

## 2017-11-23 ENCOUNTER — Inpatient Hospital Stay (HOSPITAL_COMMUNITY)
Admission: EM | Admit: 2017-11-23 | Discharge: 2017-11-27 | DRG: 728 | Disposition: A | Discharge status: Home Health | Payer: Medicare Other | Attending: Family Medicine | Admitting: Family Medicine

## 2017-11-23 DIAGNOSIS — Z87891 Personal history of nicotine dependence: Secondary | ICD-10-CM | POA: Diagnosis not present

## 2017-11-23 DIAGNOSIS — Z881 Allergy status to other antibiotic agents status: Secondary | ICD-10-CM

## 2017-11-23 DIAGNOSIS — N5089 Other specified disorders of the male genital organs: Secondary | ICD-10-CM

## 2017-11-23 DIAGNOSIS — Z885 Allergy status to narcotic agent status: Secondary | ICD-10-CM

## 2017-11-23 DIAGNOSIS — B379 Candidiasis, unspecified: Secondary | ICD-10-CM | POA: Diagnosis present

## 2017-11-23 DIAGNOSIS — R339 Retention of urine, unspecified: Secondary | ICD-10-CM | POA: Diagnosis not present

## 2017-11-23 DIAGNOSIS — L03314 Cellulitis of groin: Secondary | ICD-10-CM

## 2017-11-23 DIAGNOSIS — C220 Liver cell carcinoma: Secondary | ICD-10-CM | POA: Diagnosis present

## 2017-11-23 DIAGNOSIS — E785 Hyperlipidemia, unspecified: Secondary | ICD-10-CM | POA: Diagnosis present

## 2017-11-23 DIAGNOSIS — N492 Inflammatory disorders of scrotum: Secondary | ICD-10-CM | POA: Diagnosis not present

## 2017-11-23 DIAGNOSIS — L0390 Cellulitis, unspecified: Secondary | ICD-10-CM | POA: Diagnosis present

## 2017-11-23 DIAGNOSIS — Z808 Family history of malignant neoplasm of other organs or systems: Secondary | ICD-10-CM

## 2017-11-23 DIAGNOSIS — B372 Candidiasis of skin and nail: Secondary | ICD-10-CM

## 2017-11-23 DIAGNOSIS — Z91013 Allergy to seafood: Secondary | ICD-10-CM

## 2017-11-23 DIAGNOSIS — I10 Essential (primary) hypertension: Secondary | ICD-10-CM | POA: Diagnosis present

## 2017-11-23 DIAGNOSIS — R601 Generalized edema: Secondary | ICD-10-CM | POA: Diagnosis not present

## 2017-11-23 DIAGNOSIS — K7460 Unspecified cirrhosis of liver: Secondary | ICD-10-CM | POA: Diagnosis not present

## 2017-11-23 DIAGNOSIS — L03818 Cellulitis of other sites: Secondary | ICD-10-CM | POA: Diagnosis not present

## 2017-11-23 DIAGNOSIS — Z85828 Personal history of other malignant neoplasm of skin: Secondary | ICD-10-CM | POA: Diagnosis not present

## 2017-11-23 LAB — COMPREHENSIVE METABOLIC PANEL
ALK PHOS: 215 U/L — AB (ref 38–126)
ALT: 125 U/L — ABNORMAL HIGH (ref 0–44)
AST: 107 U/L — ABNORMAL HIGH (ref 15–41)
Albumin: 2 g/dL — ABNORMAL LOW (ref 3.5–5.0)
Anion gap: 6 (ref 5–15)
BILIRUBIN TOTAL: 2.7 mg/dL — AB (ref 0.3–1.2)
BUN: 11 mg/dL (ref 8–23)
CALCIUM: 8.4 mg/dL — AB (ref 8.9–10.3)
CO2: 29 mmol/L (ref 22–32)
Chloride: 101 mmol/L (ref 98–111)
Creatinine, Ser: 0.57 mg/dL — ABNORMAL LOW (ref 0.61–1.24)
GFR calc Af Amer: >60 mL/min (ref >60)
GLUCOSE: 97 mg/dL (ref 70–99)
POTASSIUM: 4.1 mmol/L (ref 3.5–5.1)
Sodium: 136 mmol/L (ref 135–145)
Total Protein: 6.3 g/dL — ABNORMAL LOW (ref 6.5–8.1)

## 2017-11-23 LAB — CBC WITH DIFFERENTIAL/PLATELET
ABS IMMATURE GRANULOCYTES: 0.02 10*3/uL (ref 0.00–0.07)
BASOS ABS: 0 10*3/uL (ref 0.0–0.1)
Basophils Relative: 1 %
Eosinophils Absolute: 0.3 10*3/uL (ref 0.0–0.5)
Eosinophils Relative: 4 %
HCT: 42.3 % (ref 39.0–52.0)
Hemoglobin: 14.1 g/dL (ref 13.0–17.0)
Immature Granulocytes: 0 %
Lymphocytes Relative: 16 %
Lymphs Abs: 1 10*3/uL (ref 0.7–4.0)
MCH: 35.3 pg — AB (ref 26.0–34.0)
MCHC: 33.3 g/dL (ref 30.0–36.0)
MCV: 105.8 fL — ABNORMAL HIGH (ref 80.0–100.0)
MONO ABS: 0.7 10*3/uL (ref 0.1–1.0)
Monocytes Relative: 11 %
NEUTROS ABS: 4.4 10*3/uL (ref 1.7–7.7)
NRBC: 0 % (ref 0.0–0.2)
Neutrophils Relative %: 68 %
PLATELETS: 171 10*3/uL (ref 150–400)
RBC: 4 MIL/uL — AB (ref 4.22–5.81)
RDW: 15.9 % — ABNORMAL HIGH (ref 11.5–15.5)
WBC: 6.4 10*3/uL (ref 4.0–10.5)

## 2017-11-23 LAB — URINALYSIS, ROUTINE W REFLEX MICROSCOPIC
Bilirubin Urine: NEGATIVE
Glucose, UA: NEGATIVE mg/dL
Hgb urine dipstick: NEGATIVE
Ketones, ur: NEGATIVE mg/dL
LEUKOCYTES UA: NEGATIVE
NITRITE: NEGATIVE
PH: 5 (ref 5.0–8.0)
Protein, ur: NEGATIVE mg/dL
SPECIFIC GRAVITY, URINE: 1.01 (ref 1.005–1.030)

## 2017-11-23 LAB — PROTIME-INR
INR: 1.15
PROTHROMBIN TIME: 14.6 s (ref 11.4–15.2)

## 2017-11-23 MED ORDER — SODIUM CHLORIDE 0.9 % IV SOLN
250.0000 mL | INTRAVENOUS | Status: DC | PRN
  Administered 2017-11-23: 250 mL via INTRAVENOUS

## 2017-11-23 MED ORDER — SODIUM CHLORIDE 0.9% FLUSH
3.0000 mL | Freq: Two times a day (BID) | INTRAVENOUS | Status: DC
  Administered 2017-11-23 – 2017-11-25 (×2): 3 mL via INTRAVENOUS

## 2017-11-23 MED ORDER — MAGNESIUM OXIDE 400 (241.3 MG) MG PO TABS
200.0000 mg | ORAL_TABLET | Freq: Once | ORAL | Status: AC
  Administered 2017-11-23: 200 mg via ORAL
  Filled 2017-11-23: qty 1

## 2017-11-23 MED ORDER — SODIUM CHLORIDE 0.9% FLUSH: 3.0000 mL | INTRAVENOUS | Status: DC | PRN

## 2017-11-23 MED ORDER — NYSTATIN 100000 UNIT/GM EX POWD
Freq: Once | CUTANEOUS | Status: AC
  Administered 2017-11-23: 19:00:00 via TOPICAL
  Filled 2017-11-23: qty 15

## 2017-11-23 MED ORDER — ENSURE ENLIVE PO LIQD
237.0000 mL | Freq: Two times a day (BID) | ORAL | Status: DC
  Administered 2017-11-25 – 2017-11-27 (×5): 237 mL via ORAL

## 2017-11-23 MED ORDER — VANCOMYCIN HCL IN DEXTROSE 1-5 GM/200ML-% IV SOLN
1000.0000 mg | Freq: Once | INTRAVENOUS | Status: AC
  Administered 2017-11-23: 1000 mg via INTRAVENOUS
  Filled 2017-11-23: qty 200

## 2017-11-23 MED ORDER — FLUCONAZOLE IN SODIUM CHLORIDE 400-0.9 MG/200ML-% IV SOLN
400.0000 mg | Freq: Once | INTRAVENOUS | Status: AC
  Administered 2017-11-23: 400 mg via INTRAVENOUS
  Filled 2017-11-23: qty 200

## 2017-11-23 MED ORDER — IBUPROFEN 200 MG PO TABS: 600.0000 mg | ORAL_TABLET | Freq: Four times a day (QID) | ORAL | Status: DC | PRN

## 2017-11-23 MED ORDER — ONDANSETRON HCL 4 MG PO TABS: 4.0000 mg | ORAL_TABLET | Freq: Four times a day (QID) | ORAL | Status: DC | PRN

## 2017-11-23 MED ORDER — FLUCONAZOLE IN SODIUM CHLORIDE 400-0.9 MG/200ML-% IV SOLN
400.0000 mg | INTRAVENOUS | Status: DC
  Administered 2017-11-24 – 2017-11-25 (×2): 400 mg via INTRAVENOUS
  Filled 2017-11-23 (×2): qty 200

## 2017-11-23 MED ORDER — VITAMIN E 45 MG (100 UNIT) PO CAPS
1000.0000 [IU] | ORAL_CAPSULE | Freq: Every day | ORAL | Status: DC
  Administered 2017-11-24 – 2017-11-26 (×3): 1000 [IU] via ORAL
  Administered 2017-11-27: 800 [IU] via ORAL
  Filled 2017-11-23 (×4): qty 2

## 2017-11-23 MED ORDER — FLUCONAZOLE 200 MG PO TABS
200.0000 mg | ORAL_TABLET | Freq: Once | ORAL | Status: AC
  Administered 2017-11-23: 200 mg via ORAL
  Filled 2017-11-23: qty 1

## 2017-11-23 MED ORDER — FLUCONAZOLE IN SODIUM CHLORIDE 200-0.9 MG/100ML-% IV SOLN
200.0000 mg | INTRAVENOUS | Status: DC
  Filled 2017-11-23: qty 100

## 2017-11-23 MED ORDER — FLUCONAZOLE IN SODIUM CHLORIDE 200-0.9 MG/100ML-% IV SOLN
200.0000 mg | Freq: Once | INTRAVENOUS | Status: DC
  Filled 2017-11-23: qty 100

## 2017-11-23 MED ORDER — NYSTATIN 100000 UNIT/GM EX CREA
TOPICAL_CREAM | Freq: Two times a day (BID) | CUTANEOUS | Status: DC
  Administered 2017-11-23 – 2017-11-26 (×7): via TOPICAL
  Filled 2017-11-23: qty 15

## 2017-11-23 MED ORDER — CLINDAMYCIN PHOSPHATE 600 MG/50ML IV SOLN: 600.0000 mg | Freq: Once | INTRAVENOUS | Status: DC

## 2017-11-23 MED ORDER — PIPERACILLIN-TAZOBACTAM 3.375 G IVPB 30 MIN
3.3750 g | Freq: Once | INTRAVENOUS | Status: AC
  Administered 2017-11-23: 3.375 g via INTRAVENOUS
  Filled 2017-11-23: qty 50

## 2017-11-23 MED ORDER — ONDANSETRON HCL 4 MG/2ML IJ SOLN: 4.0000 mg | Freq: Four times a day (QID) | INTRAMUSCULAR | Status: DC | PRN

## 2017-11-23 MED ORDER — VITAMIN C 500 MG PO TABS
250.0000 mg | ORAL_TABLET | Freq: Every day | ORAL | Status: DC
  Administered 2017-11-24 – 2017-11-25 (×2): 250 mg via ORAL
  Administered 2017-11-26: 500 mg via ORAL
  Administered 2017-11-27: 250 mg via ORAL
  Filled 2017-11-23 (×4): qty 1

## 2017-11-23 MED ORDER — POTASSIUM CHLORIDE CRYS ER 20 MEQ PO TBCR
40.0000 meq | EXTENDED_RELEASE_TABLET | Freq: Every day | ORAL | Status: DC
  Administered 2017-11-24 – 2017-11-27 (×4): 40 meq via ORAL
  Filled 2017-11-23 (×4): qty 2

## 2017-11-23 MED ORDER — FUROSEMIDE 10 MG/ML IJ SOLN
40.0000 mg | Freq: Two times a day (BID) | INTRAMUSCULAR | Status: DC
  Administered 2017-11-23 – 2017-11-27 (×8): 40 mg via INTRAVENOUS
  Filled 2017-11-23 (×8): qty 4

## 2017-11-23 NOTE — ED Notes (Addendum)
IV team at bedside. Will restart Vancomycin when new IV is placed

## 2017-11-23 NOTE — ED Triage Notes (Signed)
Pt wife states that he had increased swelling in abd, thighs and testicles that become more painful over the past couple days. Wife adds that doctor is aware and patient is scheduled to have CT scan of abd, pelvis and testicles.  Reports that urinating is become more difficult because of swelling.

## 2017-11-23 NOTE — ED Notes (Signed)
[place] IV blew. Attempting to get another one now.

## 2017-11-23 NOTE — H&P (Signed)
History and Physical    Devin Reeves ZOX:[id number] DOB: 27-Oct-1940 DOA: 11/23/2017  PCP: Default, Provider, MD  Patient coming from: Home  Chief Complaint: Scrotum swollen and red  HPI: Devin Reeves is a 77 y.o. male with medical history significant of hepatocellular carcinoma stage IV with anasarca comes in with several days of worsening lower extremity swelling scrotal swelling and a rash starting out in his groin area that is red and painful.  The rash is gotten red in the last several days.  Patient denies any fevers.  He denies any nausea vomiting or diarrhea.  Patient is being referred for admission for candidal rash with superimposed bacterial infection.  He is afebrile vital signs are stable.  Review of Systems: As per HPI otherwise 10 point review of systems negative.   Past Medical History:  Diagnosis Date  . Acne rosacea   . Hyperlipidemia   . Hypertension   . Liver cirrhosis, alcoholic ([place])   . Nocturia   . Skin cancer     Past Surgical History:  Procedure Laterality Date  . prostectomy  2012  . TONSILLECTOMY       reports that he has quit smoking. He has a 40.00 pack-year smoking history. He has never used smokeless tobacco. He reports that he drinks about 14.0 standard drinks of alcohol per week. He reports that he does not use drugs.  Allergies  Allergen Reactions  . Oysters [Shellfish Allergy]   . Doxycycline Rash  . Hydroxyzine Hcl Rash    Family History  Problem Relation Age of Onset  . Cancer Maternal Grandfather        bone cancer    Prior to Admission medications   Medication Sig Start Date End Date Taking? Authorizing Provider  acetaminophen (TYLENOL) 500 MG tablet Take 1,000 mg by mouth daily as needed for fever.   Yes [provider]  Ascorbic Acid (VITAMIN C) 100 MG tablet Take 300 mg by mouth daily.   Yes [provider]  feeding supplement, ENSURE ENLIVE, (ENSURE ENLIVE) LIQD Take 237 mLs by mouth 2 (two) times  daily between meals. 09/22/17  Yes Domenic Polite, MD  furosemide (LASIX) 40 MG tablet Take 1 tablet (40 mg total) by mouth 2 (two) times daily. Patient taking differently: Take 40 mg by mouth daily.  09/22/17  Yes Domenic Polite, MD  Magnesium 250 MG TABS Take 250 mg by mouth once.   Yes [provider]  potassium chloride SA (K-DUR,KLOR-CON) 20 MEQ tablet Take 2 tablets (40 mEq total) by mouth daily. Patient taking differently: Take 20 mEq by mouth daily.  09/22/17  Yes Domenic Polite, MD  triamcinolone cream (KENALOG) 0.1 % Apply 1 application topically as needed (rosacea).    Yes [provider]  vitamin E 1000 UNIT capsule Take 1,000 Units by mouth daily.   Yes [provider]  XIFAXAN 550 MG TABS tablet Take 550 mg by mouth 2 (two) times daily. 10/22/17   [provider]    Physical Exam: Vitals:   11/23/17 1402 11/23/17 1801 11/23/17 1926 11/23/17 2130  BP: (!) 169/65 (!) 165/72 (!) 169/75 (!) 159/65  Pulse: 64 74 73 75  Resp: 14 18 19 18   Temp: (!) 97.3 F (36.3 C) 98 F (36.7 C)    TempSrc: Oral Oral    SpO2: 98% 96% 96% 95%      Constitutional: NAD, calm, comfortable Vitals:   11/23/17 1402 11/23/17 1801 11/23/17 1926 11/23/17 2130  BP: (!) 169/65 (!) 165/72 Marland Kitchen)  169/75 (!) 159/65  Pulse: 64 74 73 75  Resp: 14 18 19 18   Temp: (!) 97.3 F (36.3 C) 98 F (36.7 C)    TempSrc: Oral Oral    SpO2: 98% 96% 96% 95%   Eyes: PERRL, lids and conjunctivae normal ENMT: Mucous membranes are moist. Posterior pharynx clear of any exudate or lesions.Normal dentition.  Neck: normal, supple, no masses, no thyromegaly Respiratory: clear to auscultation bilaterally, no wheezing, no crackles. Normal respiratory effort. No accessory muscle use.  Cardiovascular: Regular rate and rhythm, no murmurs / rubs / gallops.  3+ extremity edema. 2+ pedal pulses. No carotid bruits.  Abdomen: no tenderness, no masses palpated. No hepatosplenomegaly. Bowel sounds  positive.  Musculoskeletal: no clubbing / cyanosis. No joint deformity upper and lower extremities. Good ROM, no contractures. Normal muscle tone.  Skin: Diffuse erythematous rash to groin area up to suprapubic area consistent with cellulitis with underlying Candida infection  neurologic: CN 2-12 grossly intact. Sensation intact, DTR normal. Strength 5/5 in all 4.  Psychiatric: Normal judgment and insight. Alert and oriented x 3. Normal mood.    Labs on Admission: I have personally reviewed following labs and imaging studies  CBC: Recent Labs  Lab 11/23/17 1800  WBC 6.4  NEUTROABS 4.4  HGB 14.1  HCT 42.3  MCV 105.8*  PLT 287   Basic Metabolic Panel: Recent Labs  Lab 11/23/17 1800  NA 136  K 4.1  CL 101  CO2 29  GLUCOSE 97  BUN 11  CREATININE 0.57*  CALCIUM 8.4*   GFR: Estimated Creatinine Clearance: 73.6 mL/min (A) (by C-G formula based on SCr of 0.57 mg/dL (L)). Liver Function Tests: Recent Labs  Lab 11/23/17 1800  AST 107*  ALT 125*  ALKPHOS 215*  BILITOT 2.7*  PROT 6.3*  ALBUMIN 2.0*   No results for input(s): LIPASE, AMYLASE in the last 168 hours. No results for input(s): AMMONIA in the last 168 hours. Coagulation Profile: Recent Labs  Lab 11/23/17 1800  INR 1.15   Cardiac Enzymes: No results for input(s): CKTOTAL, CKMB, CKMBINDEX, TROPONINI in the last 168 hours. BNP (last 3 results) No results for input(s): PROBNP in the last 8760 hours. HbA1C: No results for input(s): HGBA1C in the last 72 hours. CBG: No results for input(s): GLUCAP in the last 168 hours. Lipid Profile: No results for input(s): CHOL, HDL, LDLCALC, TRIG, CHOLHDL, LDLDIRECT in the last 72 hours. Thyroid Function Tests: No results for input(s): TSH, T4TOTAL, FREET4, T3FREE, THYROIDAB in the last 72 hours. Anemia Panel: No results for input(s): VITAMINB12, FOLATE, FERRITIN, TIBC, IRON, RETICCTPCT in the last 72 hours. Urine analysis:    Component Value Date/Time   COLORURINE  YELLOW 11/23/2017 1850   APPEARANCEUR CLEAR 11/23/2017 1850   LABSPEC 1.010 11/23/2017 1850   PHURINE 5.0 11/23/2017 1850   GLUCOSEU NEGATIVE 11/23/2017 1850   HGBUR NEGATIVE 11/23/2017 1850   BILIRUBINUR NEGATIVE 11/23/2017 1850   KETONESUR NEGATIVE 11/23/2017 1850   PROTEINUR NEGATIVE 11/23/2017 1850   NITRITE NEGATIVE 11/23/2017 1850   LEUKOCYTESUR NEGATIVE 11/23/2017 1850   Sepsis Labs: !!!!!!!!!!!!!!!!!!!!!!!!!!!!!!!!!!!!!!!!!!!! @LABRCNTIP (procalcitonin:4,lacticidven:4) )No results found for this or any previous visit (from the past 240 hour(s)).   Radiological Exams on Admission: No results found.  Old chart reviewed Case discussed with EDP  Assessment/Plan 77 year old male with history of stage IV hepatocellular carcinoma comes in with anasarca with superimposed candidal and bacterial infection to the groin area Principal Problem:   Cellulitis-IV Vanco and Zosyn.  Treat underlying Candida.  Not septic.  Active Problems:   Hepatocellular carcinoma (HCC)-noted continue outpatient follow-up     Anasarca-placed on Lasix 40 mg IV every 12 hours     Candida infection-placed on Diflucan and nystatin cream       DVT prophylaxis: SCDs and ambulate Code Status: Full Family Communication: None Disposition Plan: 1 to 2 days Consults called: None Admission status: Admission   Devin Reeves A MD [facility]  If 7PM-7AM, please contact night-coverage [email or web address] Password [facility]  11/23/2017, 9:58 PM

## 2017-11-23 NOTE — ED Provider Notes (Signed)
[facility] DEPT Provider Note   CSN: [id number] Arrival date & time: 11/23/17  1349     History   Chief Complaint Chief Complaint  Patient presents with  . Groin Swelling  . abdominal swelling    HPI Donnovan Stamour is a 77 y.o. male.  HPI   Presents with concern for groin erythema, pain, increasing swelling in the bilateral legs, scrotum and abdomen. 2 weeks of increasing swelling 2-3 days of groin pain and redness No fevers, temperature 98 at home No chills No abdominal pain, no nausea or vomiting Foul smelling bowel movements, light brown, no black or bloody stool, no diarrhea, having 6 per day Pain 10/10 Wife concerned that eyes appear more yellow.  Past Medical History:  Diagnosis Date  . Acne rosacea   . Hyperlipidemia   . Hypertension   . Liver cirrhosis, alcoholic ([place])   . Nocturia   . Skin cancer     Patient Active Problem List   Diagnosis Date Noted  . Goals of care, counseling/discussion 10/01/2017  . Hepatocellular carcinoma ([place]) 09/30/2017  . Protein-calorie malnutrition, severe 09/21/2017  . Liver masses 09/17/2017  . Sepsis due to pneumonia ([place]) 09/16/2017  . Elevated liver function tests 09/16/2017  . Weakness 09/16/2017  . Toxic encephalopathy 09/16/2017  . Nocturia     Past Surgical History:  Procedure Laterality Date  . prostectomy  2012  . TONSILLECTOMY          Home Medications    Prior to Admission medications   Medication Sig Start Date End Date Taking? Authorizing Provider  acetaminophen (TYLENOL) 500 MG tablet Take 1,000 mg by mouth daily as needed for fever.   Yes [provider]  Ascorbic Acid (VITAMIN C) 100 MG tablet Take 300 mg by mouth daily.   Yes [provider]  feeding supplement, ENSURE ENLIVE, (ENSURE ENLIVE) LIQD Take 237 mLs by mouth 2 (two) times daily between meals. 09/22/17  Yes Domenic Polite, MD  furosemide (LASIX) 40 MG tablet Take 1 tablet (40 mg  total) by mouth 2 (two) times daily. Patient taking differently: Take 40 mg by mouth daily.  09/22/17  Yes Domenic Polite, MD  Magnesium 250 MG TABS Take 250 mg by mouth once.   Yes [provider]  potassium chloride SA (K-DUR,KLOR-CON) 20 MEQ tablet Take 2 tablets (40 mEq total) by mouth daily. Patient taking differently: Take 20 mEq by mouth daily.  09/22/17  Yes Domenic Polite, MD  triamcinolone cream (KENALOG) 0.1 % Apply 1 application topically as needed (rosacea).    Yes [provider]  vitamin E 1000 UNIT capsule Take 1,000 Units by mouth daily.   Yes [provider]  XIFAXAN 550 MG TABS tablet Take 550 mg by mouth 2 (two) times daily. 10/22/17   [provider]    Family History Family History  Problem Relation Age of Onset  . Cancer Maternal Grandfather        bone cancer    Social History Social History   Tobacco Use  . Smoking status: Former Smoker    Packs/day: 2.00    Years: 20.00    Pack years: 40.00  . Smokeless tobacco: Never Used  . Tobacco comment: quit in 1999  Substance Use Topics  . Alcohol use: Yes    Alcohol/week: 14.0 standard drinks    Types: 14 Glasses of wine per week    Comment: quit completely in 06/2017  . Drug use: No     Allergies  Oysters [shellfish allergy]; Doxycycline; and Hydroxyzine hcl   Review of Systems Review of Systems  Constitutional: Negative for fever.  HENT: Negative for sore throat.   Eyes: Negative for visual disturbance.  Respiratory: Negative for shortness of breath.   Cardiovascular: Positive for leg swelling. Negative for chest pain.  Gastrointestinal: Positive for abdominal distention. Negative for abdominal pain, constipation, diarrhea, nausea and vomiting.  Genitourinary: Negative for difficulty urinating and dysuria.  Musculoskeletal: Negative for back pain and neck stiffness.  Skin: Positive for rash and wound.  Neurological: Negative for syncope and headaches.      Physical Exam Updated Vital Signs BP (!) 169/75 (BP Location: Left Arm)   Pulse 73   Temp 98 F (36.7 C) (Oral)   Resp 19   SpO2 96%   Physical Exam  Constitutional: He is oriented to person, place, and time. He appears well-developed and well-nourished. No distress.  HENT:  Head: Normocephalic and atraumatic.  Eyes: Conjunctivae and EOM are normal.  Neck: Normal range of motion.  Cardiovascular: Normal rate, regular rhythm, normal heart sounds and intact distal pulses. Exam reveals no gallop and no friction rub.  No murmur heard. Pulmonary/Chest: Effort normal and breath sounds normal. No respiratory distress. He has no wheezes. He has no rales.  Abdominal: Soft. He exhibits no distension. There is no tenderness. There is no guarding.  Genitourinary:  Genitourinary Comments: Significant scrotal edema Scrotal erythema   Musculoskeletal: He exhibits no edema.  Neurological: He is alert and oriented to person, place, and time.  Skin: Skin is warm and dry. He is not diaphoretic. There is erythema.  Erythema of bilateral groin and scrotum, ulceration left groin  Nursing note and vitals reviewed.    ED Treatments / Results  Labs (all labs ordered are listed, but only abnormal results are displayed) Labs Reviewed  CBC WITH DIFFERENTIAL/PLATELET - Abnormal; Notable for the following components:      Result Value   RBC 4.00 (*)    MCV 105.8 (*)    MCH 35.3 (*)    RDW 15.9 (*)    All other components within normal limits  COMPREHENSIVE METABOLIC PANEL - Abnormal; Notable for the following components:   Creatinine, Ser 0.57 (*)    Calcium 8.4 (*)    Total Protein 6.3 (*)    Albumin 2.0 (*)    AST 107 (*)    ALT 125 (*)    Alkaline Phosphatase 215 (*)    Total Bilirubin 2.7 (*)    All other components within normal limits  CULTURE, BLOOD (ROUTINE X 2)  CULTURE, BLOOD (ROUTINE X 2)  URINE CULTURE  PROTIME-INR  URINALYSIS, ROUTINE W REFLEX MICROSCOPIC     EKG None  Radiology No results found.  Procedures Procedures (including critical care time)  Medications Ordered in ED Medications  nystatin (MYCOSTATIN/NYSTOP) topical powder ( Topical Given 11/23/17 1839)  fluconazole (DIFLUCAN) tablet 200 mg (200 mg Oral Given 11/23/17 1836)  vancomycin (VANCOCIN) IVPB 1000 mg/200 mL premix (0 mg Intravenous Paused 11/23/17 2037)  piperacillin-tazobactam (ZOSYN) IVPB 3.375 g (0 g Intravenous Stopped 11/23/17 1932)     Initial Impression / Assessment and Plan / ED Course  I have reviewed the triage vital signs and the nursing notes.  Pertinent labs & imaging results that were available during my care of the patient were reviewed by me and considered in my medical decision making (see chart for details).     77 year old male with a history of cirrhosis and hepatocellular carcinoma presents  with concern for 2 weeks of increasing swelling to his bilateral lower extremities, scrotum and abdomen, in addition to 2 days of painful erythematous groin and scrotal rash.  Exam concerning for likely candidal intertrigo with superimposed cellulitis extending from groin to scrotum.  Do not suspect fornier's or testicular torsion. Patient also has significant anasarca.  Given concern for groin infection with comorbidities, will admit for continued care, IV abx.  No signs of sepsis.    Final Clinical Impressions(s) / ED Diagnoses   Final diagnoses:  Anasarca  Scrotal swelling  Candidal intertrigo  Cellulitis of groin    ED Discharge Orders    None       Gareth Morgan, MD 11/23/17 2055

## 2017-11-23 NOTE — Progress Notes (Signed)
Pharmacy Antibiotic Note  Koree Schopf is a 77 y.o. male admitted on 11/23/2017 with Candidemia.  Pharmacy has been consulted for fluconazole dosing.  Plan:  Noted fluconazole 200mg  po x1 given at 1836 Fluconazole 400mg  iv q24hr, first dose now     Temp (24hrs), Avg:97.7 F (36.5 C), Min:97.3 F (36.3 C), Max:98 F (36.7 C)  Recent Labs  Lab 11/23/17 1800  WBC 6.4  CREATININE 0.57*    Estimated Creatinine Clearance: 73.6 mL/min (A) (by C-G formula based on SCr of 0.57 mg/dL (L)).    Allergies  Allergen Reactions  . Oysters [Shellfish Allergy]   . Doxycycline Rash  . Hydroxyzine Hcl Rash    Antimicrobials this admission: Fluconazole 11/23/2017 >>  Dose adjustments this admission: -  Microbiology results: -  Thank you for allowing pharmacy to be a part of this patient's care.  Nani Skillern Crowford 11/23/2017 10:10 PM

## 2017-11-23 NOTE — ED Notes (Addendum)
ED TO INPATIENT HANDOFF REPORT  Name/Age/Gender Francesco Sor 77 y.o. male  Code Status Code Status History    Date Active Date Inactive Code Status Order ID Comments User Context   09/16/2017 1826 09/22/2017 1846 Full Code [id number]  Donne Hazel, MD ED      Home/SNF/Other Home  Chief Complaint liver CA, swelling chest down, testicles swollen  Level of Care/Admitting Diagnosis ED Disposition    ED Disposition Condition [facility] Area: [facility] [[id number]]  Level of Care: Med-Surg [16]  Diagnosis: Cellulitis [748270]  Admitting Physician: Phillips Grout [[id number]]  Attending Physician: Derrill Kay A [[id number]]  Estimated length of stay: past midnight tomorrow  Certification:: I certify this patient will need inpatient services for at least 2 midnights  PT Class (Do Not Modify): Inpatient [101]  PT Acc Code (Do Not Modify): Private [1]       Medical History Past Medical History:  Diagnosis Date  . Acne rosacea   . Hyperlipidemia   . Hypertension   . Liver cirrhosis, alcoholic ([place])   . Nocturia   . Skin cancer     Allergies Allergies  Allergen Reactions  . Oysters [Shellfish Allergy]   . Doxycycline Rash  . Hydroxyzine Hcl Rash    IV Location/Drains/Wounds Patient Lines/Drains/Airways Status   Active Line/Drains/Airways    Name:   Placement date:   Placement time:   Site:   Days:   Peripheral IV 11/23/17 Right;Upper Antecubital   11/23/17    1835    Antecubital   less than 1          Labs/Imaging Results for orders placed or performed during the hospital encounter of 11/23/17 (from the past 48 hour(s))  CBC with Differential     Status: Abnormal   Collection Time: 11/23/17  6:00 PM  Result Value Ref Range   WBC 6.4 4.0 - 10.5 K/uL   RBC 4.00 (L) 4.22 - 5.81 MIL/uL   Hemoglobin 14.1 13.0 - 17.0 g/dL   HCT 42.3 39.0 - 52.0 %   MCV 105.8 (H) 80.0 - 100.0 fL   MCH 35.3 (H) 26.0 - 34.0 pg   MCHC 33.3 30.0 - 36.0  g/dL   RDW 15.9 (H) 11.5 - 15.5 %   Platelets 171 150 - 400 K/uL   nRBC 0.0 0.0 - 0.2 %   Neutrophils Relative % 68 %   Neutro Abs 4.4 1.7 - 7.7 K/uL   Lymphocytes Relative 16 %   Lymphs Abs 1.0 0.7 - 4.0 K/uL   Monocytes Relative 11 %   Monocytes Absolute 0.7 0.1 - 1.0 K/uL   Eosinophils Relative 4 %   Eosinophils Absolute 0.3 0.0 - 0.5 K/uL   Basophils Relative 1 %   Basophils Absolute 0.0 0.0 - 0.1 K/uL   Immature Granulocytes 0 %   Abs Immature Granulocytes 0.02 0.00 - 0.07 K/uL    Comment: Performed at [facility], [place] [address], [place], [place] [address]  Comprehensive metabolic panel     Status: Abnormal   Collection Time: 11/23/17  6:00 PM  Result Value Ref Range   Sodium 136 135 - 145 mmol/L   Potassium 4.1 3.5 - 5.1 mmol/L   Chloride 101 98 - 111 mmol/L   CO2 29 22 - 32 mmol/L   Glucose, Bld 97 70 - 99 mg/dL   BUN 11 8 - 23 mg/dL   Creatinine, Ser 0.57 (L) 0.61 - 1.24 mg/dL  Calcium 8.4 (L) 8.9 - 10.3 mg/dL   Total Protein 6.3 (L) 6.5 - 8.1 g/dL   Albumin 2.0 (L) 3.5 - 5.0 g/dL   AST 107 (H) 15 - 41 U/L   ALT 125 (H) 0 - 44 U/L   Alkaline Phosphatase 215 (H) 38 - 126 U/L   Total Bilirubin 2.7 (H) 0.3 - 1.2 mg/dL   GFR calc non Af Amer >60 >60 mL/min   GFR calc Af Amer >60 >60 mL/min    Comment: (NOTE) The eGFR has been calculated using the CKD EPI equation. This calculation has not been validated in all clinical situations. eGFR's persistently <60 mL/min signify possible Chronic Kidney Disease.    Anion gap 6 5 - 15    Comment: Performed at [facility], [place] [address] [place], [place] [address]  Protime-INR     Status: None   Collection Time: 11/23/17  6:00 PM  Result Value Ref Range   Prothrombin Time 14.6 11.4 - 15.2 seconds   INR 1.15     Comment: Performed at [facility], [place] [address], [place], [place] [address]  Urinalysis, Routine w reflex microscopic     Status: None   Collection  Time: 11/23/17  6:50 PM  Result Value Ref Range   Color, Urine YELLOW YELLOW   APPearance CLEAR CLEAR   Specific Gravity, Urine 1.010 1.005 - 1.030   pH 5.0 5.0 - 8.0   Glucose, UA NEGATIVE NEGATIVE mg/dL   Hgb urine dipstick NEGATIVE NEGATIVE   Bilirubin Urine NEGATIVE NEGATIVE   Ketones, ur NEGATIVE NEGATIVE mg/dL   Protein, ur NEGATIVE NEGATIVE mg/dL   Nitrite NEGATIVE NEGATIVE   Leukocytes, UA NEGATIVE NEGATIVE    Comment: Performed at [facility] [address], [place], [place] [address]   No results found.  Pending Labs Unresulted Labs (From admission, onward)    Start     Ordered   11/23/17 1653  Urine culture  STAT,   STAT     11/23/17 1652   11/23/17 1649  Blood culture (routine x 2)  BLOOD CULTURE X 2,   STAT     11/23/17 1650          Vitals/Pain Today's Vitals   11/23/17 1402 11/23/17 1801 11/23/17 1923 11/23/17 1926  BP: (!) 169/65 (!) 165/72  (!) 169/75  Pulse: 64 74  73  Resp: 14 18  19   Temp: (!) 97.3 F (36.3 C) 98 F (36.7 C)    TempSrc: Oral Oral    SpO2: 98% 96%  96%  PainSc:   0-No pain     Isolation Precautions No active isolations  Medications Medications  nystatin (MYCOSTATIN/NYSTOP) topical powder ( Topical Given 11/23/17 1839)  fluconazole (DIFLUCAN) tablet 200 mg (200 mg Oral Given 11/23/17 1836)  vancomycin (VANCOCIN) IVPB 1000 mg/200 mL premix (0 mg Intravenous Paused 11/23/17 2037)  piperacillin-tazobactam (ZOSYN) IVPB 3.375 g (0 g Intravenous Stopped 11/23/17 1932)    Mobility manual wheelchair or cane with assistance

## 2017-11-23 NOTE — Progress Notes (Signed)
A consult was received from an ED physician for vanc/zosyn per pharmacy dosing.  The patient's profile has been reviewed for ht/wt/allergies/indication/available labs.   A one time order has been placed for vanc 1g and zosyn 3.375g.  Further antibiotics/pharmacy consults should be ordered by admitting physician if indicated.                       Thank you, Kara Mead 11/23/2017  4:54 PM

## 2017-11-24 DIAGNOSIS — B372 Candidiasis of skin and nail: Secondary | ICD-10-CM

## 2017-11-24 LAB — BASIC METABOLIC PANEL
ANION GAP: 8 (ref 5–15)
BUN: 11 mg/dL (ref 8–23)
CHLORIDE: 103 mmol/L (ref 98–111)
CO2: 27 mmol/L (ref 22–32)
Calcium: 8.3 mg/dL — ABNORMAL LOW (ref 8.9–10.3)
Creatinine, Ser: 0.53 mg/dL — ABNORMAL LOW (ref 0.61–1.24)
GFR calc Af Amer: >60 mL/min (ref >60)
GFR calc non Af Amer: >60 mL/min (ref >60)
GLUCOSE: 73 mg/dL (ref 70–99)
Potassium: 3.8 mmol/L (ref 3.5–5.1)
SODIUM: 138 mmol/L (ref 135–145)

## 2017-11-24 LAB — CBC
HCT: 45 % (ref 39.0–52.0)
Hemoglobin: 15 g/dL (ref 13.0–17.0)
MCH: 35.2 pg — AB (ref 26.0–34.0)
MCHC: 33.3 g/dL (ref 30.0–36.0)
MCV: 105.6 fL — AB (ref 80.0–100.0)
Platelets: 156 10*3/uL (ref 150–400)
RBC: 4.26 MIL/uL (ref 4.22–5.81)
RDW: 16 % — AB (ref 11.5–15.5)
WBC: 7.1 10*3/uL (ref 4.0–10.5)
nRBC: 0 % (ref 0.0–0.2)

## 2017-11-24 LAB — MRSA PCR SCREENING: MRSA by PCR: NEGATIVE

## 2017-11-24 MED ORDER — ENOXAPARIN SODIUM 40 MG/0.4ML ~~LOC~~ SOLN: 40.0000 mg | SUBCUTANEOUS | Status: DC

## 2017-11-24 MED ORDER — ENOXAPARIN SODIUM 40 MG/0.4ML ~~LOC~~ SOLN
40.0000 mg | SUBCUTANEOUS | Status: DC
  Administered 2017-11-24 – 2017-11-26 (×3): 40 mg via SUBCUTANEOUS
  Filled 2017-11-24 (×2): qty 0.4

## 2017-11-24 MED ORDER — MORPHINE SULFATE (PF) 2 MG/ML IV SOLN: 2.0000 mg | INTRAVENOUS | Status: DC | PRN

## 2017-11-24 MED ORDER — VANCOMYCIN HCL 10 G IV SOLR
1500.0000 mg | INTRAVENOUS | Status: DC
  Administered 2017-11-24 – 2017-11-26 (×3): 1500 mg via INTRAVENOUS
  Filled 2017-11-24 (×4): qty 1500

## 2017-11-24 MED ORDER — PIPERACILLIN-TAZOBACTAM 3.375 G IVPB
3.3750 g | Freq: Three times a day (TID) | INTRAVENOUS | Status: DC
  Administered 2017-11-24 – 2017-11-27 (×9): 3.375 g via INTRAVENOUS
  Filled 2017-11-24 (×10): qty 50

## 2017-11-24 MED ORDER — SENNOSIDES-DOCUSATE SODIUM 8.6-50 MG PO TABS
2.0000 | ORAL_TABLET | Freq: Two times a day (BID) | ORAL | Status: DC
  Administered 2017-11-24 – 2017-11-26 (×4): 2 via ORAL
  Filled 2017-11-24 (×3): qty 2

## 2017-11-24 MED ORDER — PIPERACILLIN-TAZOBACTAM 3.375 G IVPB 30 MIN: 3.3750 g | Freq: Three times a day (TID) | INTRAVENOUS | Status: DC

## 2017-11-24 MED ORDER — VANCOMYCIN HCL IN DEXTROSE 1-5 GM/200ML-% IV SOLN
1000.0000 mg | Freq: Once | INTRAVENOUS | Status: AC
  Administered 2017-11-24: 1000 mg via INTRAVENOUS
  Filled 2017-11-24: qty 200

## 2017-11-24 NOTE — Progress Notes (Addendum)
Pharmacy Antibiotic Note  Devin Reeves is a 77 y.o. male admitted on 11/23/2017 with cellulitis.  Pharmacy has been consulted for vancomycin dosing.  Plan: Note 1g vanc given last PM at 2138 - will give vancomycin 1000mg  x 1 now then start 1500mg  IV q24 tonight - goal AUC 400-500  Height: 5\' 8"  (172.7 cm) Weight: 181 lb 3.5 oz (82.2 kg) IBW/kg (Calculated) : 68.4  Temp (24hrs), Avg:97.8 F (36.6 C), Min:97.3 F (36.3 C), Max:98.2 F (36.8 C)  Recent Labs  Lab 11/23/17 1800 11/24/17 0504  WBC 6.4 7.1  CREATININE 0.57* 0.53*    Estimated Creatinine Clearance: 79.5 mL/min (A) (by C-G formula based on SCr of 0.53 mg/dL (L)).    Allergies  Allergen Reactions  . Oysters [Shellfish Allergy]   . Doxycycline Rash  . Hydroxyzine Hcl Rash    Thank you for allowing pharmacy to be a part of this patient's care.  Kara Mead 11/24/2017 1:41 PM

## 2017-11-24 NOTE — Progress Notes (Signed)
PROGRESS NOTE  Devin Reeves TCY:[id number] DOB: [birth year] DOA: 11/23/2017 PCP: Default, Provider, MD  HPI/Recap of past 24 hours: Devin Reeves is a 77 y.o. male with medical history significant of hepatocellular carcinoma stage IV with anasarca comes in with several days of worsening lower extremity swelling scrotal swelling and a rash starting out in his groin area that is red and painful.  The rash is gotten red in the last several days.  Patient denies any fevers.  He denies any nausea vomiting or diarrhea.  Patient is being referred for admission for candidal rash with superimposed bacterial infection.  He is afebrile vital signs are stable.  11/24/2017: Patient seen and examined at his bedside.  No acute events overnight.  This morning reports severe pain in his left groin.  IV pain medication started.  Assessment/Plan: Principal Problem:   Cellulitis Active Problems:   Hepatocellular carcinoma (HCC)   Anasarca   Candida infection  Left groin cellulitis vs candidal infection On IV fluconazole, IV Vanco and IV Zosyn empirically Demarcate skin for monitoring Nystatin cream Monitor fever curve and WBC Obtain CBC in the morning  IV morphine as needed for severe pain OxyIR for moderate pain Bowel regimen with Senokot 2 tablets twice daily  Hepatocellular carcinoma Continue outpatient follow-up Continue Lasix and rifaximin  Anasarca Continue IV Lasix twice daily Continue to monitor urine output     Code Status: Full code  Family Communication: None at bedside  Disposition Plan: Home possibly in 2 to 3 days when hemodynamically stable   Consultants:  None  Procedures:  None  Antimicrobials:  IV fluconazole, IV Zosyn and IV vancomycin  DVT prophylaxis: Subcu Lovenox daily   Objective: Vitals:   11/23/17 2130 11/23/17 2227 11/24/17 0500 11/24/17 0546  BP: (!) 159/65 (!) 163/66 138/60   Pulse: 75 80 73   Resp: 18 16 14    Temp:  98.2 F (36.8  C) 97.7 F (36.5 C)   TempSrc:  Oral Oral   SpO2: 95% 97% 98%   Weight:  82 kg  82.2 kg  Height:  5\' 8"  (1.727 m)      Intake/Output Summary (Last 24 hours) at 11/24/2017 1333 Last data filed at 11/24/2017 0950 Gross per 24 hour  Intake 329.53 ml  Output 400 ml  Net -70.47 ml   Filed Weights   11/23/17 2227 11/24/17 0546  Weight: 82 kg 82.2 kg    Exam:  . General: 77 y.o. year-old male well developed well nourished.  Appears uncomfortable due to left groin pain.  Alert and interactive. . Cardiovascular: Regular rate and rhythm with no rubs or gallops.  No thyromegaly or JVD noted.   Marland Kitchen Respiratory: Clear to auscultation with no wheezes or rales. Good inspiratory effort. . Abdomen: Soft nontender nondistended with normal bowel sounds x4 quadrants. . Musculoskeletal: 2+ pitting edema in lower extremities bilaterally.  2/4 pulses in all 4 extremities. . Skin: Erythema and warmth noted at left groin. Marland Kitchen Psychiatry: Mood is appropriate for condition and setting   Data Reviewed: CBC: Recent Labs  Lab 11/23/17 1800 11/24/17 0504  WBC 6.4 7.1  NEUTROABS 4.4  --   HGB 14.1 15.0  HCT 42.3 45.0  MCV 105.8* 105.6*  PLT 171 121   Basic Metabolic Panel: Recent Labs  Lab 11/23/17 1800 11/24/17 0504  NA 136 138  K 4.1 3.8  CL 101 103  CO2 29 27  GLUCOSE 97 73  BUN 11 11  CREATININE 0.57* 0.53*  CALCIUM 8.4* 8.3*  GFR: Estimated Creatinine Clearance: 79.5 mL/min (A) (by C-G formula based on SCr of 0.53 mg/dL (L)). Liver Function Tests: Recent Labs  Lab 11/23/17 1800  AST 107*  ALT 125*  ALKPHOS 215*  BILITOT 2.7*  PROT 6.3*  ALBUMIN 2.0*   No results for input(s): LIPASE, AMYLASE in the last 168 hours. No results for input(s): AMMONIA in the last 168 hours. Coagulation Profile: Recent Labs  Lab 11/23/17 1800  INR 1.15   Cardiac Enzymes: No results for input(s): CKTOTAL, CKMB, CKMBINDEX, TROPONINI in the last 168 hours. BNP (last 3 results) No results  for input(s): PROBNP in the last 8760 hours. HbA1C: No results for input(s): HGBA1C in the last 72 hours. CBG: No results for input(s): GLUCAP in the last 168 hours. Lipid Profile: No results for input(s): CHOL, HDL, LDLCALC, TRIG, CHOLHDL, LDLDIRECT in the last 72 hours. Thyroid Function Tests: No results for input(s): TSH, T4TOTAL, FREET4, T3FREE, THYROIDAB in the last 72 hours. Anemia Panel: No results for input(s): VITAMINB12, FOLATE, FERRITIN, TIBC, IRON, RETICCTPCT in the last 72 hours. Urine analysis:    Component Value Date/Time   COLORURINE YELLOW 11/23/2017 1850   APPEARANCEUR CLEAR 11/23/2017 1850   LABSPEC 1.010 11/23/2017 1850   PHURINE 5.0 11/23/2017 1850   GLUCOSEU NEGATIVE 11/23/2017 1850   HGBUR NEGATIVE 11/23/2017 1850   BILIRUBINUR NEGATIVE 11/23/2017 1850   KETONESUR NEGATIVE 11/23/2017 1850   PROTEINUR NEGATIVE 11/23/2017 1850   NITRITE NEGATIVE 11/23/2017 1850   LEUKOCYTESUR NEGATIVE 11/23/2017 1850   Sepsis Labs: @LABRCNTIP (procalcitonin:4,lacticidven:4)  ) Recent Results (from the past 240 hour(s))  Blood culture (routine x 2)     Status: None (Preliminary result)   Collection Time: 11/23/17  6:09 PM  Result Value Ref Range Status   Specimen Description BLOOD LEFT UPPER ARM  Final   Special Requests   Final    BOTTLES DRAWN AEROBIC AND ANAEROBIC Blood Culture results may not be optimal due to an excessive volume of blood received in culture bottles Performed at [facility], [place] [address], [place], [place] [address]    Culture PENDING  Incomplete   Report Status PENDING  Incomplete      Studies: No results found.  Scheduled Meds: . feeding supplement (ENSURE ENLIVE)  237 mL Oral BID BM  . furosemide  40 mg Intravenous BID  . nystatin cream   Topical BID  . potassium chloride SA  40 mEq Oral Daily  . sodium chloride flush  3 mL Intravenous Q12H  . vitamin C  250 mg Oral Daily  . vitamin E  1,000 Units Oral Daily     Continuous Infusions: . sodium chloride 250 mL (11/23/17 2350)  . fluconazole (DIFLUCAN) IV       LOS: 1 day     Kayleen Memos, MD [facility] Pager [phone]  If 7PM-7AM, please contact night-coverage [email or web address] Password [email or web address] 11/24/2017, 1:33 PM

## 2017-11-25 ENCOUNTER — Other Ambulatory Visit: Payer: Self-pay

## 2017-11-25 DIAGNOSIS — L03818 Cellulitis of other sites: Secondary | ICD-10-CM

## 2017-11-25 DIAGNOSIS — N5089 Other specified disorders of the male genital organs: Secondary | ICD-10-CM

## 2017-11-25 DIAGNOSIS — C220 Liver cell carcinoma: Secondary | ICD-10-CM

## 2017-11-25 DIAGNOSIS — K7460 Unspecified cirrhosis of liver: Secondary | ICD-10-CM

## 2017-11-25 DIAGNOSIS — R601 Generalized edema: Secondary | ICD-10-CM

## 2017-11-25 DIAGNOSIS — I10 Essential (primary) hypertension: Secondary | ICD-10-CM

## 2017-11-25 LAB — CREATININE, SERUM
Creatinine, Ser: 0.74 mg/dL (ref 0.61–1.24)
GFR calc Af Amer: >60 mL/min (ref >60)
GFR calc non Af Amer: >60 mL/min (ref >60)

## 2017-11-25 LAB — CBC WITH DIFFERENTIAL/PLATELET
ABS IMMATURE GRANULOCYTES: 0.02 10*3/uL (ref 0.00–0.07)
Basophils Absolute: 0.1 10*3/uL (ref 0.0–0.1)
Basophils Relative: 1 %
Eosinophils Absolute: 0.4 10*3/uL (ref 0.0–0.5)
Eosinophils Relative: 6 %
HCT: 43.4 % (ref 39.0–52.0)
Hemoglobin: 14.1 g/dL (ref 13.0–17.0)
IMMATURE GRANULOCYTES: 0 %
LYMPHS PCT: 26 %
Lymphs Abs: 1.6 10*3/uL (ref 0.7–4.0)
MCH: 34.6 pg — AB (ref 26.0–34.0)
MCHC: 32.5 g/dL (ref 30.0–36.0)
MCV: 106.6 fL — AB (ref 80.0–100.0)
MONO ABS: 0.1 10*3/uL (ref 0.1–1.0)
MONOS PCT: 2 %
NEUTROS ABS: 4 10*3/uL (ref 1.7–7.7)
Neutrophils Relative %: 65 %
Platelets: 145 10*3/uL — ABNORMAL LOW (ref 150–400)
RBC: 4.07 MIL/uL — AB (ref 4.22–5.81)
RDW: 15.9 % — ABNORMAL HIGH (ref 11.5–15.5)
WBC: 6.1 10*3/uL (ref 4.0–10.5)
nRBC: 0 % (ref 0.0–0.2)

## 2017-11-25 LAB — URINE CULTURE

## 2017-11-25 MED ORDER — HYDRALAZINE HCL 20 MG/ML IJ SOLN: 10.0000 mg | Freq: Four times a day (QID) | INTRAMUSCULAR | Status: DC | PRN

## 2017-11-25 NOTE — Progress Notes (Signed)
Devin Reeves   DOB:[birth year]   WS#:[id number]   GYF#:[id number]  Oncology follow up  Subjective: Patient is well-known to me, under my care for his liver cancer, currently on systemic therapy.  He was admitted to [facility] a few days ago for severe scrotal edema and cellulitis, and bilateral lower extremity edema.  His symptoms has much improved since the admission.  His wife stopped by my office, was under a lot of distress. I met both of them today.    Objective:  Vitals:   11/25/17 0400 11/25/17 1529  BP: (!) 147/62 (!) 152/70  Pulse: 71 70  Resp: 14 16  Temp: 98.2 F (36.8 C) 98.1 F (36.7 C)  SpO2: 97% 96%    Body mass index is 28.19 kg/m.  Intake/Output Summary (Last 24 hours) at 11/25/2017 1817 Last data filed at 11/25/2017 1803 Gross per 24 hour  Intake 1245.46 ml  Output 3125 ml  Net -1879.54 ml     Sclerae unicteric  Oropharynx clear  No peripheral adenopathy  Lungs clear -- no rales or rhonchi  Heart regular rate and rhythm  Abdomen benign  GU: (+) Scrotal edema, (+) skin erythema and ecchymosis in the left groin area, moderate bilateral lower extremity edema  MSK no focal spinal tenderness, no peripheral edema  Neuro nonfocal   CBG (last 3)  No results for input(s): GLUCAP in the last 72 hours.   Labs:  Lab Results  Component Value Date   WBC 6.1 11/25/2017   HGB 14.1 11/25/2017   HCT 43.4 11/25/2017   MCV 106.6 (H) 11/25/2017   PLT 145 (L) 11/25/2017   NEUTROABS 4.0 11/25/2017   CMP Latest Ref Rng & Units 11/25/2017 11/24/2017 11/23/2017  Glucose 70 - 99 mg/dL - 73 97  BUN 8 - 23 mg/dL - 11 11  Creatinine 0.61 - 1.24 mg/dL 0.74 0.53(L) 0.57(L)  Sodium 135 - 145 mmol/L - 138 136  Potassium 3.5 - 5.1 mmol/L - 3.8 4.1  Chloride 98 - 111 mmol/L - 103 101  CO2 22 - 32 mmol/L - 27 29  Calcium 8.9 - 10.3 mg/dL - 8.3(L) 8.4(L)  Total Protein 6.5 - 8.1 g/dL - - 6.3(L)  Total Bilirubin 0.3 - 1.2 mg/dL - - 2.7(H)  Alkaline Phos 38 - 126 U/L -  - 215(H)  AST 15 - 41 U/L - - 107(H)  ALT 0 - 44 U/L - - 125(H)     Urine Studies No results for input(s): UHGB, CRYS in the last 72 hours.  Invalid input(s): UACOL, UAPR, USPG, UPH, UTP, UGL, UKET, UBIL, UNIT, UROB, ULEU, UEPI, UWBC, URBC, UBAC, CAST, UCOM, [place]  Basic Metabolic Panel: Recent Labs  Lab 11/23/17 1800 11/24/17 0504 11/25/17 0532  NA 136 138  --   K 4.1 3.8  --   CL 101 103  --   CO2 29 27  --   GLUCOSE 97 73  --   BUN 11 11  --   CREATININE 0.57* 0.53* 0.74  CALCIUM 8.4* 8.3*  --    GFR Estimated Creatinine Clearance: 80.4 mL/min (by C-G formula based on SCr of 0.74 mg/dL). Liver Function Tests: Recent Labs  Lab 11/23/17 1800  AST 107*  ALT 125*  ALKPHOS 215*  BILITOT 2.7*  PROT 6.3*  ALBUMIN 2.0*   No results for input(s): LIPASE, AMYLASE in the last 168 hours. No results for input(s): AMMONIA in the last 168 hours. Coagulation profile Recent Labs  Lab 11/23/17 1800  INR 1.15  CBC: Recent Labs  Lab 11/23/17 1800 11/24/17 0504 11/25/17 0532  WBC 6.4 7.1 6.1  NEUTROABS 4.4  --  4.0  HGB 14.1 15.0 14.1  HCT 42.3 45.0 43.4  MCV 105.8* 105.6* 106.6*  PLT 171 156 145*   Cardiac Enzymes: No results for input(s): CKTOTAL, CKMB, CKMBINDEX, TROPONINI in the last 168 hours. BNP: Invalid input(s): POCBNP CBG: No results for input(s): GLUCAP in the last 168 hours. D-Dimer No results for input(s): DDIMER in the last 72 hours. Hgb A1c No results for input(s): HGBA1C in the last 72 hours. Lipid Profile No results for input(s): CHOL, HDL, LDLCALC, TRIG, CHOLHDL, LDLDIRECT in the last 72 hours. Thyroid function studies No results for input(s): TSH, T4TOTAL, T3FREE, THYROIDAB in the last 72 hours.  Invalid input(s): FREET3 Anemia work up No results for input(s): VITAMINB12, FOLATE, FERRITIN, TIBC, IRON, RETICCTPCT in the last 72 hours. Microbiology Recent Results (from the past 240 hour(s))  Blood culture (routine x 2)     Status: None  (Preliminary result)   Collection Time: 11/23/17  6:09 PM  Result Value Ref Range Status   Specimen Description BLOOD LEFT UPPER ARM  Final   Special Requests   Final    BOTTLES DRAWN AEROBIC AND ANAEROBIC Blood Culture results may not be optimal due to an excessive volume of blood received in culture bottles Performed at [facility], [place] [address] [place], [place] [address]    Culture   Final    NO GROWTH 2 DAYS Performed at [facility] [address], [place], [place] [address]    Report Status PENDING  Incomplete  Blood culture (routine x 2)     Status: None (Preliminary result)   Collection Time: 11/23/17  6:19 PM  Result Value Ref Range Status   Specimen Description   Final    BLOOD LEFT HAND Performed at [facility] [address] [place], [place] [address]    Special Requests   Final    BOTTLES DRAWN AEROBIC AND ANAEROBIC Blood Culture results may not be optimal due to an inadequate volume of blood received in culture bottles Performed at [facility] [address], [place], [place] [address]    Culture   Final    NO GROWTH 2 DAYS Performed at [facility] [address] [place], [place] [address]    Report Status PENDING  Incomplete  Urine culture     Status: Abnormal   Collection Time: 11/23/17  6:50 PM  Result Value Ref Range Status   Specimen Description   Final    URINE, RANDOM Performed at [facility] [address], [place], [place] [address]    Special Requests   Final    NONE Performed at [facility], [place] [address], [place], [place] [address]    Culture (A)  Final    <10,000 COLONIES/mL INSIGNIFICANT GROWTH Performed at [facility] [address], [place], [place] [address]    Report Status 11/25/2017 FINAL  Final  MRSA PCR Screening     Status: None   Collection Time: 11/24/17  5:30 PM  Result Value Ref Range Status   MRSA  by PCR NEGATIVE NEGATIVE Final    Comment:        The GeneXpert MRSA Assay (FDA approved for NASAL specimens only), is one component of a comprehensive MRSA colonization surveillance program. It is not intended to diagnose MRSA infection nor to  guide or monitor treatment for MRSA infections. Performed at [facility], [place] [address] [place], [place] [address]       Studies:  No results found.  Assessment: 77 y.o. with past medical history of hepatocellular carcinoma, presented with worsening lower extremity edema, scrotal swollen and a rash in the left groin area  1.  Cellulitis in the left groin area 2.  Anasarca, bilateral lower extremity edema, and scrotal edema 3.  Hepatocellular carcinoma, stage IV 4. HTN 5. Liver cirrhosis    Plan:  -he is clinically improving  -His wife has been under a lot of stress lately, due to his metastatic liver cancer, overall not doing well, and needs a lot of care at home.  She is only caregiver at home. I referred her to our SW -I will recommend palliative care service on discharge if he is going home  -If he continue to do poorly at home, we will discuss hospice on his next office visit.  Patient has so far received 2 cycles of systemic therapy, I have not seen much clinical improvement, he is probably not responding well to therapy.  -I will f/u as needed -I appreciate the excellent care from the hospitalist team    Truitt Merle, MD 11/25/2017  6:17 PM

## 2017-11-25 NOTE — Progress Notes (Signed)
  Oncology Nurse Navigator Documentation  Navigator Location: [facility]- (11/25/17 1249)   )Navigator Encounter Type: Other (11/25/17 1249)   Mrs. Vernier walked in to see Dr. Burr Medico.  Patient is hospitalized and his dementia is worse. He is having a hard time understanding what is going on. She feels alone, estranged from children, and is exhausted caring for her husband. She reports that he is up every 30 minutes during the night to use the bathroom. She does have someone coming in two times a week to help and has friends who help walk her dogs and buy groceries. Patient reports that today she "lost it". "This is the first day that I have cried since he got sick." Referral to social work for assisting with coping skills. I will call our chaplain and ask that she reach out also. Will continue to follow as needed.                          Interventions: Psycho-social support (11/25/17 1249)            Acuity: Level 1 (11/25/17 1249)         Time Spent with Patient: 30 (11/25/17 1249)

## 2017-11-25 NOTE — Progress Notes (Signed)
PROGRESS NOTE  Devin Reeves FTD:[id number] DOB: 1940-10-24 DOA: 11/23/2017 PCP: Default, Provider, MD  HPI/Recap of past 24 hours: Devin Reeves is a 77 y.o. male with medical history significant of hepatocellular carcinoma stage IV with anasarca comes in with several days of worsening lower extremity swelling scrotal swelling and a rash starting out in his groin area that is red and painful.  The rash is gotten red in the last several days.  Patient denies any fevers.  He denies any nausea vomiting or diarrhea.  Patient is being referred for admission for candidal rash with superimposed bacterial infection.  He is afebrile vital signs are stable.  11/24/2017: Patient seen and examined at his bedside.  No acute events overnight.  This morning reports severe pain in his left groin.  IV pain medication started.  11/25/2017: Patient seen and examined at his bedside.  No acute events overnight.  Pain in his left groin is improving with current pain management.  Assessment/Plan: Principal Problem:   Cellulitis Active Problems:   Hepatocellular carcinoma (HCC)   Anasarca   Candida infection  Left groin cellulitis vs candidal infection On IV fluconazole, IV Vanco and IV Zosyn empirically Demarcate skin for monitoring Nystatin cream Monitor fever curve and WBC Obtain CBC in the morning  IV morphine as needed for severe pain OxyIR for moderate pain Bowel regimen with Senokot 2 tablets twice daily  Hepatocellular carcinoma Continue outpatient follow-up Continue Lasix and rifaximin  Anasarca Continue IV Lasix twice daily Continue to monitor urine output  Hypertension Not on blood pressure medications at home IV hydralazine PRN    Code Status: Full code  Family Communication: None at bedside  Disposition Plan: Home possibly in 2 to 3 days when hemodynamically stable   Consultants:  None  Procedures:  None  Antimicrobials:  IV fluconazole, IV Zosyn and IV  vancomycin  DVT prophylaxis: Subcu Lovenox daily   Objective: Vitals:   11/24/17 0546 11/24/17 1543 11/24/17 2025 11/25/17 0400  BP:  (!) 165/71 (!) 147/67 (!) 147/62  Pulse:  70 73 71  Resp:  18 16 14   Temp:  97.6 F (36.4 C) 98 F (36.7 C) 98.2 F (36.8 C)  TempSrc:  Oral Oral Oral  SpO2:  99% 96% 97%  Weight: 82.2 kg   84.1 kg  Height:        Intake/Output Summary (Last 24 hours) at 11/25/2017 1457 Last data filed at 11/25/2017 0847 Gross per 24 hour  Intake 480 ml  Output 1450 ml  Net -970 ml   Filed Weights   11/23/17 2227 11/24/17 0546 11/25/17 0400  Weight: 82 kg 82.2 kg 84.1 kg    Exam:  . General: 77 y.o. year-old male well-developed well-nourished in no acute distress.  Alert and interactive.  . Cardiovascular: Regular rate and rhythm with no rubs or gallops.  No JVD or thyromegaly noted. Marland Kitchen Respiratory: Clear to auscultation with no wheezes or rales. Good inspiratory effort. . Abdomen: Soft nontender nondistended with normal bowel sounds x4 quadrants. . Musculoskeletal: 2+ pitting edema in lower extremities bilaterally.  2/4 pulses in all 4 extremities. . Skin: Erythema and warmth noted at left groin. Marland Kitchen Psychiatry: Mood is appropriate for condition and setting   Data Reviewed: CBC: Recent Labs  Lab 11/23/17 1800 11/24/17 0504 11/25/17 0532  WBC 6.4 7.1 6.1  NEUTROABS 4.4  --  4.0  HGB 14.1 15.0 14.1  HCT 42.3 45.0 43.4  MCV 105.8* 105.6* 106.6*  PLT 171 156 145*  Basic Metabolic Panel: Recent Labs  Lab 11/23/17 1800 11/24/17 0504 11/25/17 0532  NA 136 138  --   K 4.1 3.8  --   CL 101 103  --   CO2 29 27  --   GLUCOSE 97 73  --   BUN 11 11  --   CREATININE 0.57* 0.53* 0.74  CALCIUM 8.4* 8.3*  --    GFR: Estimated Creatinine Clearance: 80.4 mL/min (by C-G formula based on SCr of 0.74 mg/dL). Liver Function Tests: Recent Labs  Lab 11/23/17 1800  AST 107*  ALT 125*  ALKPHOS 215*  BILITOT 2.7*  PROT 6.3*  ALBUMIN 2.0*   No  results for input(s): LIPASE, AMYLASE in the last 168 hours. No results for input(s): AMMONIA in the last 168 hours. Coagulation Profile: Recent Labs  Lab 11/23/17 1800  INR 1.15   Cardiac Enzymes: No results for input(s): CKTOTAL, CKMB, CKMBINDEX, TROPONINI in the last 168 hours. BNP (last 3 results) No results for input(s): PROBNP in the last 8760 hours. HbA1C: No results for input(s): HGBA1C in the last 72 hours. CBG: No results for input(s): GLUCAP in the last 168 hours. Lipid Profile: No results for input(s): CHOL, HDL, LDLCALC, TRIG, CHOLHDL, LDLDIRECT in the last 72 hours. Thyroid Function Tests: No results for input(s): TSH, T4TOTAL, FREET4, T3FREE, THYROIDAB in the last 72 hours. Anemia Panel: No results for input(s): VITAMINB12, FOLATE, FERRITIN, TIBC, IRON, RETICCTPCT in the last 72 hours. Urine analysis:    Component Value Date/Time   COLORURINE YELLOW 11/23/2017 1850   APPEARANCEUR CLEAR 11/23/2017 1850   LABSPEC 1.010 11/23/2017 1850   PHURINE 5.0 11/23/2017 1850   GLUCOSEU NEGATIVE 11/23/2017 1850   HGBUR NEGATIVE 11/23/2017 1850   BILIRUBINUR NEGATIVE 11/23/2017 1850   KETONESUR NEGATIVE 11/23/2017 1850   PROTEINUR NEGATIVE 11/23/2017 1850   NITRITE NEGATIVE 11/23/2017 1850   LEUKOCYTESUR NEGATIVE 11/23/2017 1850   Sepsis Labs: @LABRCNTIP (procalcitonin:4,lacticidven:4)  ) Recent Results (from the past 240 hour(s))  Blood culture (routine x 2)     Status: None (Preliminary result)   Collection Time: 11/23/17  6:09 PM  Result Value Ref Range Status   Specimen Description BLOOD LEFT UPPER ARM  Final   Special Requests   Final    BOTTLES DRAWN AEROBIC AND ANAEROBIC Blood Culture results may not be optimal due to an excessive volume of blood received in culture bottles Performed at [facility], [place] [address] [place], [place] [address]    Culture   Final    NO GROWTH 2 DAYS Performed at [facility] [address],  [place], [place] [address]    Report Status PENDING  Incomplete  Blood culture (routine x 2)     Status: None (Preliminary result)   Collection Time: 11/23/17  6:19 PM  Result Value Ref Range Status   Specimen Description   Final    BLOOD LEFT HAND Performed at [facility] [address], [place], [place] [address]    Special Requests   Final    BOTTLES DRAWN AEROBIC AND ANAEROBIC Blood Culture results may not be optimal due to an inadequate volume of blood received in culture bottles Performed at [facility] [address], [place], [place] [address]    Culture   Final    NO GROWTH 2 DAYS Performed at [facility] [address] [place], [place] [address]    Report Status PENDING  Incomplete  Urine culture  Status: Abnormal   Collection Time: 11/23/17  6:50 PM  Result Value Ref Range Status   Specimen Description   Final    URINE, RANDOM Performed at [facility] [address], [place], [place] [address]    Special Requests   Final    NONE Performed at [facility], [place] [address], [place], [place] [address]    Culture (A)  Final    <10,000 COLONIES/mL INSIGNIFICANT GROWTH Performed at [facility] [address], [place], [place] [address]    Report Status 11/25/2017 FINAL  Final  MRSA PCR Screening     Status: None   Collection Time: 11/24/17  5:30 PM  Result Value Ref Range Status   MRSA by PCR NEGATIVE NEGATIVE Final    Comment:        The GeneXpert MRSA Assay (FDA approved for NASAL specimens only), is one component of a comprehensive MRSA colonization surveillance program. It is not intended to diagnose MRSA infection nor to guide or monitor treatment for MRSA infections. Performed at [facility], [place] [address] [place], [place] [address]       Studies: No results found.  Scheduled Meds: . enoxaparin (LOVENOX) injection  40 mg Subcutaneous  Q24H  . feeding supplement (ENSURE ENLIVE)  237 mL Oral BID BM  . furosemide  40 mg Intravenous BID  . nystatin cream   Topical BID  . potassium chloride SA  40 mEq Oral Daily  . senna-docusate  2 tablet Oral BID  . sodium chloride flush  3 mL Intravenous Q12H  . vitamin C  250 mg Oral Daily  . vitamin E  1,000 Units Oral Daily    Continuous Infusions: . sodium chloride 250 mL (11/23/17 2350)  . fluconazole (DIFLUCAN) IV 400 mg (11/24/17 1913)  . piperacillin-tazobactam (ZOSYN)  IV 3.375 g (11/25/17 1018)  . vancomycin 1,500 mg (11/24/17 2154)     LOS: 2 days     Kayleen Memos, MD [facility] Pager [phone]  If 7PM-7AM, please contact night-coverage [email or web address] Password [email or web address] 11/25/2017, 2:57 PM

## 2017-11-26 ENCOUNTER — Telehealth: Payer: Self-pay

## 2017-11-26 ENCOUNTER — Encounter: Payer: Self-pay | Admitting: *Deleted

## 2017-11-26 LAB — CREATININE, SERUM
CREATININE: 0.65 mg/dL (ref 0.61–1.24)
GFR calc non Af Amer: >60 mL/min (ref >60)

## 2017-11-26 MED ORDER — FLUCONAZOLE 100 MG PO TABS
400.0000 mg | ORAL_TABLET | Freq: Every day | ORAL | Status: DC
  Administered 2017-11-26: 400 mg via ORAL
  Filled 2017-11-26: qty 4

## 2017-11-26 NOTE — Progress Notes (Addendum)
PROGRESS NOTE  Devin Reeves MWU:[id number] DOB: April 13, 1940 DOA: 11/23/2017 PCP: Default, Provider, MD  HPI/Recap of past 24 hours: Devin Reeves is a 77 y.o. male with medical history significant of hepatocellular carcinoma stage IV with anasarca comes in with several days of worsening lower extremity swelling scrotal swelling and a rash starting out in his groin area that is red and painful.  The rash is gotten red in the last several days.  Patient denies any fevers.  He denies any nausea vomiting or diarrhea.  Patient is being referred for admission for candidal rash with superimposed bacterial infection.    TRH asked to admit.  Hospital course complicated by severe pain from his cellulitis which is now improving.  11/26/2017: Patient seen and examined at his bedside.  No acute events overnight.  Pain is improving.  Urinary retention noted per nursing with 475 cc on bladder scan at bedside.   Assessment/Plan: Principal Problem:   Cellulitis Active Problems:   Hepatocellular carcinoma (HCC)   Anasarca   Candida infection   Scrotal swelling  Left groin candidal infection superimposed by cellulitis, improving On IV fluconazole, IV Vanco and IV Zosyn empirically Demarcate skin for monitoring Nystatin cream Monitor fever curve and WBC Continue IV morphine as needed for severe pain Continue OxyIR for moderate pain Continue bowel regimen with Senokot 2 tablets twice daily  Hepatocellular carcinoma Continue outpatient follow-up Continue Lasix and rifaximin  Anasarca Continue IV Lasix twice daily Continue to monitor urine output  Urinary retention Greater than 475 cc of urine on bladder scan Insert Foley cath Continue to monitor urine output  Hypertension Blood pressure stable Not on blood pressure medications at home IV hydralazine PRN    Code Status: Full code  Family Communication: None at bedside  Disposition Plan: Home possibly tomorrow  11/27/2017   Consultants:  None  Procedures:  None  Antimicrobials:  IV fluconazole, IV Zosyn and IV vancomycin  DVT prophylaxis: Subcu Lovenox daily   Objective: Vitals:   11/25/17 1529 11/25/17 2105 11/26/17 0610 11/26/17 1312  BP: (!) 152/70 134/71 (!) 146/66 (!) 146/55  Pulse: 70 68 67 65  Resp: 16 18 18    Temp: 98.1 F (36.7 C) 98.1 F (36.7 C) 98 F (36.7 C) 98.9 F (37.2 C)  TempSrc: Oral Oral Oral Oral  SpO2: 96% 96% 95% 96%  Weight:      Height:        Intake/Output Summary (Last 24 hours) at 11/26/2017 1604 Last data filed at 11/26/2017 1334 Gross per 24 hour  Intake 2773.67 ml  Output 3800 ml  Net -1026.33 ml   Filed Weights   11/23/17 2227 11/24/17 0546 11/25/17 0400  Weight: 82 kg 82.2 kg 84.1 kg    Exam:  . General: 77 y.o. year-old male developed well-nourished in no acute distress.  Alert oriented x3.   . Cardiovascular: Regular rate and rhythm with no rubs or gallops.  No JVD or thyromegaly noted.   Marland Kitchen Respiratory: To auscultation with no wheezes or rales.  Good inspiratory effort.  . Abdomen: Soft nontender mildly distended with normal bowel sounds x4 quadrants. . Musculoskeletal: 1+ pitting edema in lower extremities bilaterally.  2/4 pulses in all 4 extremities. . Skin: Improved erythema and warmth noted at left groin. Marland Kitchen Psychiatry: Mood is appropriate for condition and setting   Data Reviewed: CBC: Recent Labs  Lab 11/23/17 1800 11/24/17 0504 11/25/17 0532  WBC 6.4 7.1 6.1  NEUTROABS 4.4  --  4.0  HGB 14.1 15.0  14.1  HCT 42.3 45.0 43.4  MCV 105.8* 105.6* 106.6*  PLT 171 156 607*   Basic Metabolic Panel: Recent Labs  Lab 11/23/17 1800 11/24/17 0504 11/25/17 0532 11/26/17 0405  NA 136 138  --   --   K 4.1 3.8  --   --   CL 101 103  --   --   CO2 29 27  --   --   GLUCOSE 97 73  --   --   BUN 11 11  --   --   CREATININE 0.57* 0.53* 0.74 0.65  CALCIUM 8.4* 8.3*  --   --    GFR: Estimated Creatinine Clearance:  80.4 mL/min (by C-G formula based on SCr of 0.65 mg/dL). Liver Function Tests: Recent Labs  Lab 11/23/17 1800  AST 107*  ALT 125*  ALKPHOS 215*  BILITOT 2.7*  PROT 6.3*  ALBUMIN 2.0*   No results for input(s): LIPASE, AMYLASE in the last 168 hours. No results for input(s): AMMONIA in the last 168 hours. Coagulation Profile: Recent Labs  Lab 11/23/17 1800  INR 1.15   Cardiac Enzymes: No results for input(s): CKTOTAL, CKMB, CKMBINDEX, TROPONINI in the last 168 hours. BNP (last 3 results) No results for input(s): PROBNP in the last 8760 hours. HbA1C: No results for input(s): HGBA1C in the last 72 hours. CBG: No results for input(s): GLUCAP in the last 168 hours. Lipid Profile: No results for input(s): CHOL, HDL, LDLCALC, TRIG, CHOLHDL, LDLDIRECT in the last 72 hours. Thyroid Function Tests: No results for input(s): TSH, T4TOTAL, FREET4, T3FREE, THYROIDAB in the last 72 hours. Anemia Panel: No results for input(s): VITAMINB12, FOLATE, FERRITIN, TIBC, IRON, RETICCTPCT in the last 72 hours. Urine analysis:    Component Value Date/Time   COLORURINE YELLOW 11/23/2017 1850   APPEARANCEUR CLEAR 11/23/2017 1850   LABSPEC 1.010 11/23/2017 1850   PHURINE 5.0 11/23/2017 1850   GLUCOSEU NEGATIVE 11/23/2017 1850   HGBUR NEGATIVE 11/23/2017 1850   BILIRUBINUR NEGATIVE 11/23/2017 1850   KETONESUR NEGATIVE 11/23/2017 1850   PROTEINUR NEGATIVE 11/23/2017 1850   NITRITE NEGATIVE 11/23/2017 1850   LEUKOCYTESUR NEGATIVE 11/23/2017 1850   Sepsis Labs: @LABRCNTIP (procalcitonin:4,lacticidven:4)  ) Recent Results (from the past 240 hour(s))  Blood culture (routine x 2)     Status: None (Preliminary result)   Collection Time: 11/23/17  6:09 PM  Result Value Ref Range Status   Specimen Description BLOOD LEFT UPPER ARM  Final   Special Requests   Final    BOTTLES DRAWN AEROBIC AND ANAEROBIC Blood Culture results may not be optimal due to an excessive volume of blood received in culture  bottles Performed at [facility], [place] [address], [place], [place] [address]    Culture   Final    NO GROWTH 3 DAYS Performed at [facility], [place] [address] [place], [place] [address]    Report Status PENDING  Incomplete  Blood culture (routine x 2)     Status: None (Preliminary result)   Collection Time: 11/23/17  6:19 PM  Result Value Ref Range Status   Specimen Description   Final    BLOOD LEFT HAND Performed at [place] [address], [place], [place] [address]    Special Requests   Final    BOTTLES DRAWN AEROBIC AND ANAEROBIC Blood Culture results may not be optimal due to an inadequate volume of blood received in culture bottles Performed at [place] [address] [place], [place]  [address]    Culture   Final    NO GROWTH 3 DAYS Performed at [facility], [place] [address], [place], [place] [address]    Report Status PENDING  Incomplete  Urine culture     Status: Abnormal   Collection Time: 11/23/17  6:50 PM  Result Value Ref Range Status   Specimen Description   Final    URINE, RANDOM Performed at [facility] [address], [place], [place] [address]    Special Requests   Final    NONE Performed at [facility], [place] [address], [place], [place] [address]    Culture (A)  Final    <10,000 COLONIES/mL INSIGNIFICANT GROWTH Performed at [place] [address], [place], [place] [address]    Report Status 11/25/2017 FINAL  Final  MRSA PCR Screening     Status: None   Collection Time: 11/24/17  5:30 PM  Result Value Ref Range Status   MRSA by PCR NEGATIVE NEGATIVE Final    Comment:        The GeneXpert MRSA Assay (FDA approved for NASAL specimens only), is one component of a comprehensive MRSA colonization surveillance program. It is not intended to diagnose MRSA infection nor to guide or monitor treatment for MRSA  infections. Performed at [facility], [place] [address], [place], [place] [address]       Studies: No results found.  Scheduled Meds: . enoxaparin (LOVENOX) injection  40 mg Subcutaneous Q24H  . feeding supplement (ENSURE ENLIVE)  237 mL Oral BID BM  . fluconazole  400 mg Oral q1800  . furosemide  40 mg Intravenous BID  . nystatin cream   Topical BID  . potassium chloride SA  40 mEq Oral Daily  . senna-docusate  2 tablet Oral BID  . sodium chloride flush  3 mL Intravenous Q12H  . vitamin C  250 mg Oral Daily  . vitamin E  1,000 Units Oral Daily    Continuous Infusions: . sodium chloride 10 mL/hr at 11/26/17 0610  . piperacillin-tazobactam (ZOSYN)  IV 3.375 g (11/26/17 0815)  . vancomycin Stopped (11/26/17 0132)     LOS: 3 days     Kayleen Memos, MD [facility] Pager [phone]  If 7PM-7AM, please contact night-coverage [email or web address] Password [email or web address] 11/26/2017, 4:04 PM

## 2017-11-26 NOTE — Telephone Encounter (Signed)
Left voice message for patient's wife that we are cancelling the CT scan for tomorrow 10/30 will reschedule for later date per Dr. Burr Medico.

## 2017-11-26 NOTE — Care Management Important Message (Signed)
Important Message  Patient Details  Name: Devin Reeves MRN: [id number] Date of Birth: July 12, 1940   Medicare Important Message Given:  Yes    Kerin Salen 11/26/2017, 11:07 AMImportant Message  Patient Details  Name: Devin Reeves MRN: [id number] Date of Birth: 1940-04-22   Medicare Important Message Given:  Yes    Kerin Salen 11/26/2017, 11:06 AM

## 2017-11-26 NOTE — Progress Notes (Signed)
PHARMACIST - PHYSICIAN COMMUNICATION  CONCERNING: Antibiotic IV to Oral Route Change Policy  RECOMMENDATION: This patient is receiving fluconazole by the intravenous route.  Based on criteria approved by the Pharmacy and Therapeutics Committee, the antibiotic(s) is/are being converted to the equivalent oral dose form(s).   DESCRIPTION: These criteria include:  Patient being treated for a respiratory tract infection, urinary tract infection, cellulitis or clostridium difficile associated diarrhea if on metronidazole  The patient is not neutropenic and does not exhibit a GI malabsorption state  The patient is eating (either orally or via tube) and/or has been taking other orally administered medications for a least 24 hours  The patient is improving clinically and has a Tmax < 100.5  If you have questions about this conversion, please contact the Pharmacy Department  []   [phone] )  [facility] []   [phone] )  [facility] []   [phone] )  [facility] []   [phone] )  [facility] [x]   [phone] )  [place], [place].D [phone] 11/26/2017 9:31 AM

## 2017-11-26 NOTE — Progress Notes (Signed)
[place] Work  Holiday representative received referral form medical oncology for emotional support for patients wife.  CSW contacted patients wife at home to offer support and assess for needs.  CSW left patients wife a message offering support and encouraging patients wife to return CSW call.  CSW team will continue attempting to contact for support.    Johnnye Lana, MSW, LCSW, OSW-C Clinical Social Worker [facility] [phone]

## 2017-11-27 ENCOUNTER — Ambulatory Visit (HOSPITAL_COMMUNITY): Payer: Medicare Other

## 2017-11-27 DIAGNOSIS — B379 Candidiasis, unspecified: Secondary | ICD-10-CM

## 2017-11-27 LAB — CREATININE, SERUM
Creatinine, Ser: 0.82 mg/dL (ref 0.61–1.24)
GFR calc non Af Amer: >60 mL/min (ref >60)

## 2017-11-27 MED ORDER — FLUCONAZOLE 100 MG PO TABS: 100.0000 mg | ORAL_TABLET | Freq: Every day | ORAL | 0 refills | Status: DC

## 2017-11-27 MED ORDER — AMOXICILLIN-POT CLAVULANATE 875-125 MG PO TABS: 1.0000 | ORAL_TABLET | Freq: Two times a day (BID) | ORAL | 0 refills | Status: AC

## 2017-11-27 NOTE — Progress Notes (Addendum)
One hour post foley removal , pt was incontinent of urine . His penis has retracted into the scrotum.Pt was educated in the importance of placing a folded wash clothe under his scrotum and keep it elevated to decrease edema.Also to keep his legs elevated when he is sitting down

## 2017-11-27 NOTE — Evaluation (Signed)
Physical Therapy Evaluation Patient Details Name: Devin Reeves MRN: [id number] DOB: [birth year] Today's Date: 11/27/2017   History of Present Illness  77 y.o. male with medical history significant of HTN, HLD, cirrhosis who presented 11/23/17 with LE and scrotal edema.  Clinical Impression  The patient improved with mobility and ambulated x 220'. Patient's wife present. Plans Dc today with [facility].     Follow Up Recommendations Home health PT;Supervision/Assistance - 24 hour    Equipment Recommendations  None recommended by PT    Recommendations for Other Services       Precautions / Restrictions Precautions Precautions: Fall      Mobility  Bed Mobility Overal bed mobility: Needs Assistance Bed Mobility: Supine to Sit     Supine to sit: Supervision;HOB elevated     General bed mobility comments: extra time  Transfers Overall transfer level: Needs assistance Equipment used: Rolling walker (2 wheeled) Transfers: Sit to/from [facility] Sit to Stand: Mod assist;From elevated surface Stand pivot transfers: Mod assist       General transfer comment: steady assist to rise from be. Wife assisted patient from the recliner., providing proper scooting and hand position to push up. Patient complained of being dizzy. Shuffle steps to the recliner. BP 154/74  Ambulation/Gait Ambulation/Gait assistance: Min assist Gait Distance (Feet): 220 Feet Assistive device: Rolling walker (2 wheeled) Gait Pattern/deviations: Step-through pattern;Trunk flexed;Decreased stride length     General Gait Details: wife present to assist with gait. The patient was slightly unsteady, relies on RW,  Stairs            Wheelchair Mobility    Modified Rankin (Stroke Patients Only)       Balance Overall balance assessment: Needs assistance Sitting-balance support: Feet supported;Bilateral upper extremity supported Sitting balance-Leahy Scale: Fair      Standing balance support: During functional activity;Bilateral upper extremity supported Standing balance-Leahy Scale: Poor Standing balance comment: requires min assist for balance, tendency to posterior lean                             Pertinent Vitals/Pain Pain Assessment: Faces Faces Pain Scale: Hurts little more Pain Location: scrotal area when scooting Pain Intervention(s): Limited activity within patient's tolerance;Monitored during session    [place] expects to be discharged to:: Private residence Living Arrangements: Spouse/significant other Available Help at Discharge: Family;Available 24 hours/day Type of Home: House Home Access: Stairs to enter Entrance Stairs-Rails: None Entrance Stairs-Number of Steps: 1 Home Layout: Two level;Able to live on main level with bedroom/bathroom Home Equipment: Gilford Rile - 2 wheels;Bedside commode;Cane - single point Additional Comments: has  [employer] 2 days/ week    Prior Function Level of Independence: Independent with assistive device(s)               Hand Dominance        Extremity/Trunk Assessment   Upper Extremity Assessment Upper Extremity Assessment: Generalized weakness    Lower Extremity Assessment Lower Extremity Assessment: Generalized weakness    Cervical / Trunk Assessment Cervical / Trunk Assessment: Kyphotic  Communication   Communication: No difficulties  Cognition Arousal/Alertness: Awake/alert Behavior During Therapy: WFL for tasks assessed/performed                                   General Comments: patient had some difficulty following the conversation about Dc plan, HH PT  General Comments      Exercises     Assessment/Plan    PT Assessment All further PT needs can be met in the next venue of care  PT Problem List Decreased strength;Decreased cognition;Decreased activity tolerance;Decreased knowledge of use of DME;Decreased  balance;Decreased mobility       PT Treatment Interventions      PT Goals (Current goals can be found in the Care Plan section)  Acute Rehab PT Goals Patient Stated Goal: to go home PT Goal Formulation: All assessment and education complete, DC therapy    Frequency     Barriers to discharge        Co-evaluation               AM-PAC PT "6 Clicks" Daily Activity  Outcome Measure Difficulty turning over in bed (including adjusting bedclothes, sheets and blankets)?: A Lot Difficulty moving from lying on back to sitting on the side of the bed? : A Lot Difficulty sitting down on and standing up from a chair with arms (e.g., wheelchair, bedside commode, etc,.)?: A Lot Help needed moving to and from a bed to chair (including a wheelchair)?: A Lot Help needed walking in hospital room?: A Lot Help needed climbing 3-5 steps with a railing? : Total 6 Click Score: 7    End of Session Equipment Utilized During Treatment: Gait belt Activity Tolerance: Patient tolerated treatment well Patient left: in chair;with call bell/phone within reach;with family/visitor present Nurse Communication: Mobility status PT Visit Diagnosis: Unsteadiness on feet (R26.81)    Time: 6986-1483 PT Time Calculation (min) (ACUTE ONLY): 43 min   Charges:   PT Evaluation $PT Eval Moderate Complexity: 1 Mod PT Treatments $Gait Training: 23-37 mins        Tresa Endo PT Acute Rehabilitation Services Pager [phone] Office [phone]    Claretha Cooper 11/27/2017, 1:13 PM

## 2017-11-27 NOTE — Care Management (Signed)
This CM met with pt and wife for DC planning. Pt has had [facility] in the past for home health services and would like to use them again. Pt and wife decline SNF. Pt has RW and 3in1 at home currently. He also has private duty aides on Mondays and Fridays.  [facility] rep alerted of referral and MD orders received. Marney Doctor RN,BSN

## 2017-11-27 NOTE — Progress Notes (Signed)
Pharmacy Antibiotic Note  Devin Reeves is a 77 y.o. male admitted on 11/23/2017 with cellulitis.  Pharmacy has been consulted for vancomycin and fluconazole dosing. 11/27/2017  Abx D#5, full day #4. AF, Scr 0.82, all culture data no growth so far. L groin cellulitis vs candidal infection improving per MD notes.   Plan: Fluconazole 400 mg po qday  vanc 1500 mg IV q24 Zosyn Extended interval - 3.375 gm IV q8h, infuse each dose over 4 hours Consider transition to PO abx today If vanc to continue will check peak/trough  Height: 5\' 8"  (172.7 cm) Weight: 185 lb 6.5 oz (84.1 kg) IBW/kg (Calculated) : 68.4  Temp (24hrs), Avg:98.3 F (36.8 C), Min:97.5 F (36.4 C), Max:98.9 F (37.2 C)  Recent Labs  Lab 11/23/17 1800 11/24/17 0504 11/25/17 0532 11/26/17 0405 11/27/17 0416  WBC 6.4 7.1 6.1  --   --   CREATININE 0.57* 0.53* 0.74 0.65 0.82    Estimated Creatinine Clearance: 78.4 mL/min (by C-G formula based on SCr of 0.82 mg/dL).    Allergies  Allergen Reactions  . Oysters [Shellfish Allergy]   . Doxycycline Rash  . Hydroxyzine Hcl Rash    Antimicrobials this admission:  10/26 >> Fluconazole >> 10/26 Vanc >> 10/26 Zosyn >>  Dose adjustments this admission:   Microbiology results:  10/26 BCx: ngtd 10/26 UCx:  insig growth F 10/27 MRSA PCR: neg  Thank you for allowing pharmacy to be a part of this patient's care.  Eudelia Bunch, Pharm.D [phone] 11/27/2017 8:40 AM

## 2017-11-28 LAB — CULTURE, BLOOD (ROUTINE X 2)
Culture: NO GROWTH
Culture: NO GROWTH

## 2017-11-28 NOTE — Discharge Summary (Signed)
Physician Discharge Summary  Devin Reeves KDT:[id number] DOB: March 26, 1940 DOA: 11/23/2017  PCP: Default, Provider, MD  Admit date: 11/23/2017 Discharge date: 11/27/2017  Admitted From: Home  Disposition:  Home   Recommendations for Outpatient Follow-up:  1. Follow up with Urology in 1 week 2. Follow up with PCP in 1 week 3. For PCP: please arrange outpatient palliative care referral   Home Health: Yes  Equipment/Devices: None  Discharge Condition: Poor  CODE STATUS: FULL Diet recommendation: Regular  Brief/Interim Summary: Devin Reeves a 77 y.o.M with metastatichepatocellular carcinoma with anasarca and worsening functional status who presented with several days scrotal swelling and red painful groin rash.  Started on fluconazole and antibiotics for candidiasis with superimposed bacterial infection.      PRINCIPAL HOSPITAL DIAGNOSIS: Scrotal cellulitis    Discharge Diagnoses:   Left groin candidal infection superimposed by cellulitis, improving Treated with IV fluconazole, vancomycin and Zosyn.  He had dramatic improvement in his scrotal swelling, no fever, and marked improvement in pain.  On day of discharge, he was switched to fluconazole 100 mg and Augmentin for 10 days.     Hepatocellular carcinoma Resumed home diuretics at discharge.Oncology evaluated the patient, they recommend an outpatient referral to Palliative Care, which must come from PCP's office.  Urinary retention Foley placed due to urinary retention.  Failed voiding trial at discharge, nursing directed to place foley until patient's Urology follow up next week, they declined.     Hypertension         Discharge Instructions  Discharge Instructions    Diet general   Complete by:  As directed    Discharge instructions   Complete by:  As directed    From Dr. Loleta Books: You were admitted with an infection in your groin. This was caused in part by a yeast infection, probably with  some bacteria overlapping.  For the yeast: Finish treatment by taking fluconazole 100 mg nightly for the next 7 days  For the bacterial infection: Finish treatment by taking Augmentin 875-125 twice daily for the next 7 days  Follow up with Dr. Lynne Logan office in 1 week.  Our staff have made this appointment, confirm the date and time with nursing before you leave.  Call Dr. Ernestina Penna office for a follow up appointment.   Increase activity slowly   Complete by:  As directed      Allergies as of 11/27/2017      Reactions   Oysters [shellfish Allergy]    Doxycycline Rash   Hydroxyzine Hcl Rash      Medication List    TAKE these medications   acetaminophen 500 MG tablet Commonly known as:  TYLENOL Take 1,000 mg by mouth daily as needed for fever.   amoxicillin-clavulanate 875-125 MG tablet Commonly known as:  AUGMENTIN Take 1 tablet by mouth 2 (two) times daily for 7 days.   feeding supplement (ENSURE ENLIVE) Liqd Take 237 mLs by mouth 2 (two) times daily between meals.   fluconazole 100 MG tablet Commonly known as:  DIFLUCAN Take 1 tablet (100 mg total) by mouth at bedtime.   furosemide 40 MG tablet Commonly known as:  LASIX Take 1 tablet (40 mg total) by mouth 2 (two) times daily. What changed:  when to take this   Magnesium 250 MG Tabs Take 250 mg by mouth once.   potassium chloride SA 20 MEQ tablet Commonly known as:  K-DUR,KLOR-CON Take 2 tablets (40 mEq total) by mouth daily. What changed:  how much to take  triamcinolone cream 0.1 % Commonly known as:  KENALOG Apply 1 application topically as needed (rosacea).   vitamin C 100 MG tablet Take 300 mg by mouth daily.   vitamin E 1000 UNIT capsule Take 1,000 Units by mouth daily.   XIFAXAN 550 MG Tabs tablet Generic drug:  rifaximin Take 550 mg by mouth 2 (two) times daily.      Follow-up Information    Raynelle Bring, MD.   Specialty:  Urology Why:  Patient will be called at home by [facility] for [facility]t. The nurse has to schedule the appt because the schedule is full. Contact information: [place] [address] [phone]        Care, [facility] Follow up.   Specialty:  Home Health Services Why:  for home health services Contact information: [address] [place] [address] [phone]          Allergies  Allergen Reactions  . Oysters [Shellfish Allergy]   . Doxycycline Rash  . Hydroxyzine Hcl Rash    Consultations:  Oncology   Procedures/Studies:  No results found.    Subjective: Feeling much better today.  Swelling is down, pain is improved.  No confusion, change in mentation, cough, dysuria.    Discharge Exam: Vitals:   11/27/17 0559 11/27/17 1000  BP: (!) 148/61 (!) 152/60  Pulse: 66 72  Resp: 18   Temp: (!) 97.5 F (36.4 C) 98.6 F (37 C)  SpO2: 94% 97%   Vitals:   11/26/17 1312 11/26/17 2056 11/27/17 0559 11/27/17 1000  BP: (!) 146/55 137/68 (!) 148/61 (!) 152/60  Pulse: 65 85 66 72  Resp:  18 18   Temp: 98.9 F (37.2 C) 98.5 F (36.9 C) (!) 97.5 F (36.4 C) 98.6 F (37 C)  TempSrc: Oral Oral Oral Oral  SpO2: 96% 95% 94% 97%  Weight:      Height:        General: Pt is alert, awake, not in acute distress, appears very weak Cardiovascular: RRR, nl S1-S2, no murmurs appreciated.   No LE edema.   Respiratory: Normal respiratory rate and rhythm.  CTAB without rales or wheezes. GU: There is mild redness in the groin, but no induration or tenderness.  The scrotum is slightly edematous but not hard, no masses, no draining ulcers, no necrosis, minimal tenderness.  Neuro/Psych: Strength symmetric in upper and lower extremities.  Judgment and insight appear normal.   The results of significant diagnostics from this hospitalization (including imaging, microbiology, ancillary and laboratory) are listed below for reference.     Microbiology: Recent Results (from the past 240 hour(s))   Blood culture (routine x 2)     Status: None (Preliminary result)   Collection Time: 11/23/17  6:09 PM  Result Value Ref Range Status   Specimen Description BLOOD LEFT UPPER ARM  Final   Special Requests   Final    BOTTLES DRAWN AEROBIC AND ANAEROBIC Blood Culture results may not be optimal due to an excessive volume of blood received in culture bottles Performed at [facility] [address], [place], [place] [address]    Culture   Final    NO GROWTH 4 DAYS Performed at [facility], [place] [address], [place], [place] [address]    Report Status PENDING  Incomplete  Blood culture (routine x 2)     Status: None (Preliminary result)   Collection Time: 11/23/17  6:19 PM  Result Value Ref Range Status   Specimen Description   Final    BLOOD LEFT HAND Performed at [facility] [address], [place], [place] [address]    Special Requests   Final    BOTTLES DRAWN AEROBIC AND ANAEROBIC Blood Culture results may not be optimal due to an inadequate volume of blood received in culture bottles Performed at [facility] [address] [place], [place] [address]    Culture   Final    NO GROWTH 4 DAYS Performed at [facility], [place] [address], [place], [place] [address]    Report Status PENDING  Incomplete  Urine culture     Status: Abnormal   Collection Time: 11/23/17  6:50 PM  Result Value Ref Range Status   Specimen Description   Final    URINE, RANDOM Performed at [facility] [address] [place], [place] [address]    Special Requests   Final    NONE Performed at [facility], [place] [address], [place], [place] [address]    Culture (A)  Final    <10,000 COLONIES/mL INSIGNIFICANT GROWTH Performed at [facility] [address], [place], [place] [address]    Report Status 11/25/2017 FINAL  Final  MRSA PCR Screening     Status: None   Collection Time: 11/24/17   5:30 PM  Result Value Ref Range Status   MRSA by PCR NEGATIVE NEGATIVE Final    Comment:        The GeneXpert MRSA Assay (FDA approved for NASAL specimens only), is one component of a comprehensive MRSA colonization surveillance program. It is not intended to diagnose MRSA infection nor to guide or monitor treatment for MRSA infections. Performed at [facility], [place] [address], [place],  [address]      Labs: BNP (last 3 results) Recent Labs    09/16/17 1558  BNP 324.4*   Basic Metabolic Panel: Recent Labs  Lab 11/23/17 1800 11/24/17 0504 11/25/17 0532 11/26/17 0405 11/27/17 0416  NA 136 138  --   --   --   K 4.1 3.8  --   --   --   CL 101 103  --   --   --   CO2 29 27  --   --   --   GLUCOSE 97 73  --   --   --   BUN 11 11  --   --   --   CREATININE 0.57* 0.53* 0.74 0.65 0.82  CALCIUM 8.4* 8.3*  --   --   --    Liver Function Tests: Recent Labs  Lab 11/23/17 1800  AST 107*  ALT 125*  ALKPHOS 215*  BILITOT 2.7*  PROT 6.3*  ALBUMIN 2.0*   No results for input(s): LIPASE, AMYLASE in the last 168 hours. No results for input(s): AMMONIA in the last 168 hours. CBC: Recent Labs  Lab 11/23/17 1800 11/24/17 0504 11/25/17 0532  WBC 6.4 7.1 6.1  NEUTROABS 4.4  --  4.0  HGB 14.1 15.0 14.1  HCT 42.3 45.0 43.4  MCV 105.8* 105.6* 106.6*  PLT 171 156 145*   Cardiac Enzymes: No results for input(s): CKTOTAL, CKMB, CKMBINDEX, TROPONINI in the last 168 hours. BNP: Invalid input(s): POCBNP CBG: No results for input(s): GLUCAP in the last 168 hours. D-Dimer No results for input(s): DDIMER in the last 72 hours. Hgb A1c No results for input(s): HGBA1C in the  last 72 hours. Lipid Profile No results for input(s): CHOL, HDL, LDLCALC, TRIG, CHOLHDL, LDLDIRECT in the last 72 hours. Thyroid function studies No results for input(s): TSH, T4TOTAL, T3FREE, THYROIDAB in the last 72 hours.  Invalid input(s): FREET3 Anemia work up No results  for input(s): VITAMINB12, FOLATE, FERRITIN, TIBC, IRON, RETICCTPCT in the last 72 hours. Urinalysis    Component Value Date/Time   COLORURINE YELLOW 11/23/2017 1850   APPEARANCEUR CLEAR 11/23/2017 1850   LABSPEC 1.010 11/23/2017 1850   PHURINE 5.0 11/23/2017 1850   GLUCOSEU NEGATIVE 11/23/2017 1850   HGBUR NEGATIVE 11/23/2017 1850   BILIRUBINUR NEGATIVE 11/23/2017 1850   KETONESUR NEGATIVE 11/23/2017 1850   PROTEINUR NEGATIVE 11/23/2017 1850   NITRITE NEGATIVE 11/23/2017 1850   LEUKOCYTESUR NEGATIVE 11/23/2017 1850   Sepsis Labs Invalid input(s): PROCALCITONIN,  WBC,  LACTICIDVEN Microbiology Recent Results (from the past 240 hour(s))  Blood culture (routine x 2)     Status: None (Preliminary result)   Collection Time: 11/23/17  6:09 PM  Result Value Ref Range Status   Specimen Description BLOOD LEFT UPPER ARM  Final   Special Requests   Final    BOTTLES DRAWN AEROBIC AND ANAEROBIC Blood Culture results may not be optimal due to an excessive volume of blood received in culture bottles Performed at [facility], [place] [address], [place], [place] [address]    Culture   Final    NO GROWTH 4 DAYS Performed at [facility], [place] [address], [place], [place] [address]    Report Status PENDING  Incomplete  Blood culture (routine x 2)     Status: None (Preliminary result)   Collection Time: 11/23/17  6:19 PM  Result Value Ref Range Status   Specimen Description   Final    BLOOD LEFT HAND Performed at [facility] [address], [place], [place] [address]    Special Requests   Final    BOTTLES DRAWN AEROBIC AND ANAEROBIC Blood Culture results may not be optimal due to an inadequate volume of blood received in culture bottles Performed at [facility] [address], [place], [place] [address]    Culture   Final    NO GROWTH 4 DAYS Performed at [facility], [place] [address], [place], [place] [address]    Report  Status PENDING  Incomplete  Urine culture     Status: Abnormal   Collection Time: 11/23/17  6:50 PM  Result Value Ref Range Status   Specimen Description   Final    URINE, RANDOM Performed at [place] [facility] [address] [place], [place] [address]    Special Requests   Final    NONE Performed at [facility], [place] [address], [place], [place] [address]    Culture (A)  Final    <10,000 COLONIES/mL INSIGNIFICANT GROWTH Performed at [facility] [address] [place], [place] [address]    Report Status 11/25/2017 FINAL  Final  MRSA PCR Screening     Status: None   Collection Time: 11/24/17  5:30 PM  Result Value Ref Range Status   MRSA by PCR NEGATIVE NEGATIVE Final    Comment:        The GeneXpert MRSA Assay (FDA approved for NASAL specimens only), is one component of a comprehensive MRSA colonization surveillance program. It is not intended to diagnose MRSA infection nor to guide or monitor treatment for MRSA infections. Performed at [facility], [place] [address] [place], [place] [address]      Time coordinating discharge: 40 minutes       SIGNED:   Edwin Dada, MD  [facility] 11/27/2017, 9:45 AM

## 2017-12-06 ENCOUNTER — Encounter: Payer: Self-pay | Admitting: Hematology

## 2017-12-06 ENCOUNTER — Telehealth: Payer: Self-pay | Admitting: Hematology

## 2017-12-06 ENCOUNTER — Inpatient Hospital Stay: Payer: Medicare Other

## 2017-12-06 ENCOUNTER — Inpatient Hospital Stay (HOSPITAL_BASED_OUTPATIENT_CLINIC_OR_DEPARTMENT_OTHER): Payer: Medicare Other | Admitting: Hematology

## 2017-12-06 ENCOUNTER — Inpatient Hospital Stay: Payer: Medicare Other | Attending: Hematology

## 2017-12-06 VITALS — BP 138/56

## 2017-12-06 VITALS — BP 144/64 | HR 62 | Resp 17 | Ht 68.0 in | Wt 151.6 lb

## 2017-12-06 DIAGNOSIS — K766 Portal hypertension: Secondary | ICD-10-CM | POA: Diagnosis not present

## 2017-12-06 DIAGNOSIS — F1011 Alcohol abuse, in remission: Secondary | ICD-10-CM

## 2017-12-06 DIAGNOSIS — R531 Weakness: Secondary | ICD-10-CM | POA: Insufficient documentation

## 2017-12-06 DIAGNOSIS — R918 Other nonspecific abnormal finding of lung field: Secondary | ICD-10-CM

## 2017-12-06 DIAGNOSIS — R410 Disorientation, unspecified: Secondary | ICD-10-CM

## 2017-12-06 DIAGNOSIS — C220 Liver cell carcinoma: Secondary | ICD-10-CM

## 2017-12-06 DIAGNOSIS — Z5112 Encounter for antineoplastic immunotherapy: Secondary | ICD-10-CM | POA: Diagnosis not present

## 2017-12-06 DIAGNOSIS — Z7189 Other specified counseling: Secondary | ICD-10-CM

## 2017-12-06 DIAGNOSIS — I10 Essential (primary) hypertension: Secondary | ICD-10-CM | POA: Insufficient documentation

## 2017-12-06 DIAGNOSIS — Z87891 Personal history of nicotine dependence: Secondary | ICD-10-CM

## 2017-12-06 DIAGNOSIS — Z79899 Other long term (current) drug therapy: Secondary | ICD-10-CM | POA: Diagnosis not present

## 2017-12-06 DIAGNOSIS — K5900 Constipation, unspecified: Secondary | ICD-10-CM | POA: Diagnosis not present

## 2017-12-06 DIAGNOSIS — J918 Pleural effusion in other conditions classified elsewhere: Secondary | ICD-10-CM | POA: Diagnosis not present

## 2017-12-06 DIAGNOSIS — R609 Edema, unspecified: Secondary | ICD-10-CM | POA: Diagnosis not present

## 2017-12-06 DIAGNOSIS — J90 Pleural effusion, not elsewhere classified: Secondary | ICD-10-CM | POA: Insufficient documentation

## 2017-12-06 DIAGNOSIS — I2510 Atherosclerotic heart disease of native coronary artery without angina pectoris: Secondary | ICD-10-CM | POA: Diagnosis not present

## 2017-12-06 DIAGNOSIS — K7460 Unspecified cirrhosis of liver: Secondary | ICD-10-CM

## 2017-12-06 DIAGNOSIS — K7031 Alcoholic cirrhosis of liver with ascites: Secondary | ICD-10-CM | POA: Insufficient documentation

## 2017-12-06 DIAGNOSIS — I700 Atherosclerosis of aorta: Secondary | ICD-10-CM | POA: Diagnosis not present

## 2017-12-06 LAB — CBC WITH DIFFERENTIAL (CANCER CENTER ONLY)
ABS IMMATURE GRANULOCYTES: 0.01 10*3/uL (ref 0.00–0.07)
BASOS ABS: 0.1 10*3/uL (ref 0.0–0.1)
Basophils Relative: 2 %
Eosinophils Absolute: 0.4 10*3/uL (ref 0.0–0.5)
Eosinophils Relative: 8 %
HEMATOCRIT: 44.7 % (ref 39.0–52.0)
HEMOGLOBIN: 14.6 g/dL (ref 13.0–17.0)
Immature Granulocytes: 0 %
LYMPHS PCT: 18 %
Lymphs Abs: 1 10*3/uL (ref 0.7–4.0)
MCH: 35.1 pg — ABNORMAL HIGH (ref 26.0–34.0)
MCHC: 32.7 g/dL (ref 30.0–36.0)
MCV: 107.5 fL — ABNORMAL HIGH (ref 80.0–100.0)
MONO ABS: 0.5 10*3/uL (ref 0.1–1.0)
Monocytes Relative: 9 %
NEUTROS ABS: 3.4 10*3/uL (ref 1.7–7.7)
NRBC: 0 % (ref 0.0–0.2)
Neutrophils Relative %: 63 %
Platelet Count: 121 10*3/uL — ABNORMAL LOW (ref 150–400)
RBC: 4.16 MIL/uL — AB (ref 4.22–5.81)
RDW: 15.8 % — ABNORMAL HIGH (ref 11.5–15.5)
WBC: 5.3 10*3/uL (ref 4.0–10.5)

## 2017-12-06 LAB — CMP (CANCER CENTER ONLY)
ALK PHOS: 267 U/L — AB (ref 38–126)
ALT: 136 U/L — AB (ref 0–44)
AST: 115 U/L — AB (ref 15–41)
Albumin: 2.2 g/dL — ABNORMAL LOW (ref 3.5–5.0)
Anion gap: 7 (ref 5–15)
BUN: 16 mg/dL (ref 8–23)
CHLORIDE: 106 mmol/L (ref 98–111)
CO2: 27 mmol/L (ref 22–32)
CREATININE: 0.74 mg/dL (ref 0.61–1.24)
Calcium: 9.1 mg/dL (ref 8.9–10.3)
GFR, Est AFR Am: >60 mL/min (ref >60)
Glucose, Bld: 157 mg/dL — ABNORMAL HIGH (ref 70–99)
Potassium: 3.9 mmol/L (ref 3.5–5.1)
Sodium: 140 mmol/L (ref 135–145)
Total Bilirubin: 2.6 mg/dL — ABNORMAL HIGH (ref 0.3–1.2)
Total Protein: 7.3 g/dL (ref 6.5–8.1)

## 2017-12-06 LAB — TSH: TSH: 3.127 u[IU]/mL (ref 0.320–4.118)

## 2017-12-06 LAB — TOTAL PROTEIN, URINE DIPSTICK: PROTEIN: NEGATIVE mg/dL

## 2017-12-06 MED ORDER — SODIUM CHLORIDE 0.9 % IV SOLN
Freq: Once | INTRAVENOUS | Status: AC
  Administered 2017-12-06: 11:00:00 via INTRAVENOUS
  Filled 2017-12-06: qty 250

## 2017-12-06 MED ORDER — SODIUM CHLORIDE 0.9 % IV SOLN
14.2000 mg/kg | Freq: Once | INTRAVENOUS | Status: AC
  Administered 2017-12-06: 1000 mg via INTRAVENOUS
  Filled 2017-12-06: qty 32

## 2017-12-06 MED ORDER — XIFAXAN 550 MG PO TABS: 550.0000 mg | ORAL_TABLET | Freq: Two times a day (BID) | ORAL | 2 refills | Status: DC

## 2017-12-06 MED ORDER — SODIUM CHLORIDE 0.9 % IV SOLN
1200.0000 mg | Freq: Once | INTRAVENOUS | Status: AC
  Administered 2017-12-06: 1200 mg via INTRAVENOUS
  Filled 2017-12-06: qty 20

## 2017-12-06 NOTE — Progress Notes (Signed)
[place]  [phone] [phone]  Clinic Follow up Note   Patient Care Team: Default, Provider, MD as PCP - General   Date of Service:  12/06/2017  Chief complaint: Follow up for [facility]  Oncology History   Cancer Staging Hepatocellular carcinoma [facility]) Staging form: Liver, AJCC 8th Edition - Clinical stage from 10/01/2017: Stage IVB (cT4, cN0, cM1) - Signed by Truitt Merle, MD on 10/01/2017       Hepatocellular carcinoma ([place])   09/16/2017 Imaging    09/16/2017 Abdominal US IMPRESSION: 1. At least 2 large hepatic masses, both located within the RIGHT liver lobe, measuring 8 cm and 7.3 cm respectively. These are highly suspicious for neoplastic masses. If/when patient is clinically stable and able to follow directions and hold their breath (preferably as an outpatient) further evaluation with dedicated liver MRI should be considered. 2. Suspect underlying liver cirrhosis. 3. Small amount of ascites within the RIGHT upper quadrant. 4. Cholelithiasis without convincing evidence of acute cholecystitis. No bile duct dilatation.    09/16/2017 - 09/22/2017 Hospital Admission    Admit date: 09/16/2017 Admission diagnosis: [facility] Additional comments: He was hospitalized on 09/16/2017 for weakness and fever, and was treated empirically for possible pneumonia and presumed sepsis. Abdominal US showed multiple liver lesions suspicious of malignancy and Dr. Alen Blew was consulted. Patient was discharged on 09/22/2017.     09/17/2017 Imaging    09/17/2017 CT CAP IMPRESSION: 1. Large malignant-appearing mass in the right lobe of the liver measuring 7.5 x 10.0 x 12.7 cm. Given the underlying cirrhosis, this presumably reflects a primary hepatocellular carcinoma. 2. 1.3 x 0.7 x 1.3 cm nodule in the left upper lobe posteriorly. The possibility of a metastatic lesion should be considered, however, this may alternatively reflect an infectious or inflammatory nodule. Close  attention on future follow-up imaging is recommended. 3. Small to moderate volume of ascites. 4. Moderate to large bilateral pleural effusions lying dependently with associated passive subsegmental atelectasis in the lower lobes of the lungs bilaterally. 5. Mild cardiomegaly. 6. Aortic atherosclerosis, in addition to left main and 3 vessel coronary artery disease. Assessment for potential risk factor modification, dietary therapy or pharmacologic therapy may be warranted, if clinically indicated. 7. There are calcifications of the aortic valve and mitral annulus. Echocardiographic correlation for evaluation of potential valvular dysfunction may be warranted if clinically indicated. 8. Colonic diverticulosis. 9. Additional incidental findings, as above.     09/18/2017 Procedure    09/18/2017 Thoracocentesis  IMPRESSION: Successful ultrasound guided right thoracentesis yielding 680 mL of pleural fluid.    09/18/2017 Pathology Results    Pathology 09/18/2017 Cytology Diagnosis PLEURAL FLUID, RIGHT (SPECIMEN 1 OF 1, COLLECTED ON 09/18/17): NO MALIGNANT CELLS IDENTIFIED.    09/18/2017 Tumor Marker    Tumor Markers AFP Results for SHAHEEN, MENDE (MRN [id number]) as of 09/30/2017 11:42  Ref. Range 09/18/2017 14:34  AFP, Serum, Tumor Marker Latest Ref Range: 0.0 - 8.3 ng/mL 78.6 (H)       09/20/2017 Imaging    09/20/2017 MRI Liver IMPRESSION: 1. Again demonstrated is a large hepatic mass with intravascular invasion involving the hepatic veins, IVC and portal vein. This remains most consistent with hepatocellular carcinoma. 2. Generalized soft tissue edema with ascites and bilateral pleural effusions. Right pleural effusion has decreased in volume from previous CT. 3. Underlying hepatic cirrhosis with multiple portosystemic collaterals consistent with portal hypertension.    09/30/2017 Initial Diagnosis    Hepatocellular carcinoma ([place])    10/01/2017 Cancer Staging  Staging form:  Liver, AJCC 8th Edition - Clinical stage from 10/01/2017: Stage IVB (cT4, cN0, cM1) - Signed by Truitt Merle, MD on 10/01/2017    10/05/2017 -  Chemotherapy    atezolizumab 1200 mg and bevacizumab 15 mg/kg q3 weeks, began 10/05/17      HISTORY OF PRESENTING ILLNESS (According to Dr. Alen Blew on 09/20/2017) I was asked by Dr. Broadus John  to evaluate Mr. [place] for new finding of liver mass in the setting of cirrhosis of the liver.  He is a 77 year old gentleman native of [place] currently resides in this area with his wife.  His children predominate live in [place].  He has a history of hypertension as well as heavy alcohol use in the past who was hospitalized on 09/16/2017 with complaints of fevers and bilateral pulmonary infiltrates as well as effusion.  He was treated empirically for possible pneumonia and presumed sepsis.  He underwent thoracentesis on the right side of the lung which yielded 680 cc with a cytology did not reveal any malignancy.  Imaging study of the chest abdomen and pelvis obtained on 09/17/2017 showed a large hepatic mass measuring 7.5 x 10.0 x 12.7 cm with cirrhosis findings associated with it.  There is a 1.3 x 0.7 x 1.3 cm nodule area of the left upper lobe of the lung was also noted.  Moderate to large bilateral effusion were noted as well.  His alpha-fetoprotein was elevated at 78.6.  MRI of the liver obtained on 09/20/2017 showed the same large hepatic mass with intravascular invasion into the hepatic veins, IVC and portal vein which is consistent with hepatocellular carcinoma.  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  Clinically, he is still quite debilitated although he is eating slightly better.  He still overall weak and reporting his respiratory symptoms slightly improved.  He still has intermittent fevers.  He denies any abdominal discomfort.  He does not report any headaches, blurry vision, syncope or seizures.  Memory issues has been reported by his  family.  Does not report any fevers, chills or sweats. Does not report any cough, wheezing or hemoptysis. Does not report any chest pain, palpitation, orthopnea or leg edema. Does not report any nausea, vomiting or abdominal pain. Does not report any constipation or diarrhea. Does not report any skeletal complaints.   Does not report frequency, urgency or hematuria.  Does not report any skin rashes or lesions. Does not report any heat or cold intolerance.  Does not report any lymphadenopathy or petechiae.  Remaining review of systems is negative.   CURRENT THERAPY: atezolizumab 1200 mg and bevacizumab 15 mg/kg q3 weeks, began 10/05/17    INTERVAL HISTORY:  Dev[place] Reeves is a 77 y.o. male who is here for follow-up post hospitalization for severe scrotal edema and cellulitis.  He presents to the clinic today accompanied by his family member. Since being discharged he has been doing PT. He was prescribed Xifaxan after discharge, but pt does not have one to fill. His other antibiotics were completed. He notes he is mostly clear and not confused. His wife notes he can have a hard time to understand.  The swelling of his scrotum has improved and reduced as he urinates. He note longer has catheter. He was seen by his urologist and his urine was clear, but is still not able to completely empty out his bladder. He was also started on Lasix BID. He does not like urinating this frequently so he reduced himself to once a day.  His urologist told him to increase back to BID.  He notes he had adequate but hard stools lately. He took [place]. He is willing to use Lactulose. His wife notes she feels he is getting better but he is losing weight although he has adequate appetite.    REVIEW OF SYSTEMS:   Constitutional: Denies fevers, chills or abnormal weight loss (+) wheelchair  Eyes: Denies blurriness of vision Ears, nose, mouth, throat, and face: Denies mucositis or sore throat Respiratory: Denies cough or  wheezes Cardiovascular: Denies palpitation, chest discomfort (+) lower extremity swelling up to his groin, improved   Gastrointestinal:  Denies nausea, heartburn or change in bowel habits (+) ascites with bloating  (+) recent hard stool Urinary: (+) Frequent urination form Lasix (+) does not empty bladder Skin: Denies abnormal skin rashes (+) multiple skin bruises  Lymphatics: Denies new lymphadenopathy or easy bruising Neurological:Denies numbness, tingling or new weaknesses Behavioral/Psych: Mood is stable, no new changes  All other systems were reviewed with the patient and are negative.  MEDICAL HISTORY:  Past Medical History:  Diagnosis Date  . Acne rosacea   . Hyperlipidemia   . Hypertension   . Liver cirrhosis, alcoholic ([place])   . Nocturia   . Skin cancer     SURGICAL HISTORY: Past Surgical History:  Procedure Laterality Date  . prostectomy  2012  . TONSILLECTOMY      I have reviewed the social history and family history with the patient and they are unchanged from previous note.  ALLERGIES:  is allergic to oysters [shellfish allergy]; doxycycline; and hydroxyzine hcl.  MEDICATIONS:  Current Outpatient Medications  Medication Sig Dispense Refill  . Ascorbic Acid (VITAMIN C) 100 MG tablet Take 300 mg by mouth daily.    . feeding supplement, ENSURE ENLIVE, (ENSURE ENLIVE) LIQD Take 237 mLs by mouth 2 (two) times daily between meals. 237 mL 2  . furosemide (LASIX) 40 MG tablet Take 1 tablet (40 mg total) by mouth 2 (two) times daily. (Patient taking differently: Take 40 mg by mouth daily. ) 60 tablet 0  . Magnesium 250 MG TABS Take 250 mg by mouth once.    . potassium chloride SA (K-DUR,KLOR-CON) 20 MEQ tablet Take 2 tablets (40 mEq total) by mouth daily. (Patient taking differently: Take 20 mEq by mouth daily. ) 30 tablet 0  . triamcinolone cream (KENALOG) 0.1 % Apply 1 application topically as needed (rosacea).     . vitamin E 1000 UNIT capsule Take 1,000 Units by mouth  daily.    Marland Kitchen XIFAXAN 550 MG TABS tablet Take 1 tablet (550 mg total) by mouth 2 (two) times daily. 60 tablet 2  . acetaminophen (TYLENOL) 500 MG tablet Take 1,000 mg by mouth daily as needed for fever.     No current facility-administered medications for this visit.    Facility-Administered Medications Ordered in Other Visits  Medication Dose Route Frequency Provider Last Rate Last Dose  . atezolizumab (TECENTRIQ) 1,200 mg in sodium chloride 0.9 % 250 mL chemo infusion  1,200 mg Intravenous Once Truitt Merle, MD      . bevacizumab (AVASTIN) 1,000 mg in sodium chloride 0.9 % 100 mL chemo infusion  14.2 mg/kg (Treatment Plan Recorded) Intravenous Once Truitt Merle, MD 280 mL/hr at 12/06/17 1153 1,000 mg at 12/06/17 1153    PHYSICAL EXAMINATION:  ECOG PERFORMANCE STATUS: 3 - Symptomatic, >50% confined to bed  Vitals:   12/06/17 0950  BP: (!) 144/64  Pulse: 62  Resp: 17  SpO2: 100%  Filed Weights   12/06/17 0950  Weight: 151 lb 9.6 oz (68.8 kg)    GENERAL:alert, no distress and comfortable SKIN: skin color, texture, turgor are normal, no rashes or significant lesions (+) multiple skin ecchymosis on her arms, and head  EYES: normal, Conjunctiva are pink and non-injected, sclera clear OROPHARYNX:no exudate, no erythema and lips, buccal mucosa, and tongue normal  NECK: supple, thyroid normal size, non-tender, without nodularity LYMPH:  no palpable lymphadenopathy in the cervical, axillary or inguinal LUNGS: clear to auscultation and percussion with normal breathing effort (+) diminished air entry at lung bases likely due to pleural effusion  HEART: regular rate & rhythm (+) Mild lower extremity edema b/l (+) heart murmur ABDOMEN:abdomen soft, non-tender and normal bowel sounds (+) liver 10 cm below costal margin (+) distended abdomen, with ascites Musculoskeletal:no cyanosis of digits and no clubbing  NEURO: alert & oriented x 3 with fluent speech, no focal motor/sensory deficits Scrotum:  mild edema, much improved. Previous rash at the left inguinal area resolved,  (+) skin erythema in the inner of the left groin and perianal area, next to the scrotum, no ulcers or discharge   LABORATORY DATA:  I have reviewed the data as listed CBC Latest Ref Rng & Units 12/06/2017 11/25/2017 11/24/2017  WBC 4.0 - 10.5 K/uL 5.3 6.1 7.1  Hemoglobin 13.0 - 17.0 g/dL 14.6 14.1 15.0  Hematocrit 39.0 - 52.0 % 44.7 43.4 45.0  Platelets 150 - 400 K/uL 121(L) 145(L) 156     CMP Latest Ref Rng & Units 12/06/2017 11/27/2017 11/26/2017  Glucose 70 - 99 mg/dL 157(H) - -  BUN 8 - 23 mg/dL 16 - -  Creatinine 0.61 - 1.24 mg/dL 0.74 0.82 0.65  Sodium 135 - 145 mmol/L 140 - -  Potassium 3.5 - 5.1 mmol/L 3.9 - -  Chloride 98 - 111 mmol/L 106 - -  CO2 22 - 32 mmol/L 27 - -  Calcium 8.9 - 10.3 mg/dL 9.1 - -  Total Protein 6.5 - 8.1 g/dL 7.3 - -  Total Bilirubin 0.3 - 1.2 mg/dL 2.6(H) - -  Alkaline Phos 38 - 126 U/L 267(H) - -  AST 15 - 41 U/L 115(H) - -  ALT 0 - 44 U/L 136(H) - -   Tumor Markers AFP  Results for [place], CHAMPNEY (MRN [id number]) as of 12/06/2017 08:08  Ref. Range 09/18/2017 14:34 10/01/2017 09:58 11/01/2017 12:38  AFP, Serum, Tumor Marker Latest Ref Range: 0.0 - 8.3 ng/mL 78.6 (H) 86.4 (H) 85.8 (H)     Procedures  09/18/2017 Thoracocentesis  IMPRESSION: Successful ultrasound guided right thoracentesis yielding 680 mL of pleural fluid.   Pathology 09/18/2017 Cytology Diagnosis PLEURAL FLUID, RIGHT (SPECIMEN 1 OF 1, COLLECTED ON 09/18/17): NO MALIGNANT CELLS IDENTIFIED.    RADIOGRAPHIC STUDIES: I have personally reviewed the radiological images as listed and agreed with the findings in the report.  09/20/2017 MRI Liver IMPRESSION: 1. Again demonstrated is a large hepatic mass with intravascular invasion involving the hepatic veins, IVC and portal vein. This remains most consistent with hepatocellular carcinoma. 2. Generalized soft tissue edema with ascites and bilateral  pleural effusions. Right pleural effusion has decreased in volume from previous CT. 3. Underlying hepatic cirrhosis with multiple portosystemic collaterals consistent with portal hypertension.  09/17/2017 CT CAP IMPRESSION: 1. Large malignant-appearing mass in the right lobe of the liver measuring 7.5 x 10.0 x 12.7 cm. Given the underlying cirrhosis, this presumably reflects a primary hepatocellular carcinoma. 2. 1.3 x 0.7 x 1.3 cm nodule  in the left upper lobe posteriorly. The possibility of a metastatic lesion should be considered, however, this may alternatively reflect an infectious or inflammatory nodule. Close attention on future follow-up imaging is recommended. 3. Small to moderate volume of ascites. 4. Moderate to large bilateral pleural effusions lying dependently with associated passive subsegmental atelectasis in the lower lobes of the lungs bilaterally. 5. Mild cardiomegaly. 6. Aortic atherosclerosis, in addition to left main and 3 vessel coronary artery disease. Assessment for potential risk factor modification, dietary therapy or pharmacologic therapy may be warranted, if clinically indicated. 7. There are calcifications of the aortic valve and mitral annulus. Echocardiographic correlation for evaluation of potential valvular dysfunction may be warranted if clinically indicated. 8. Colonic diverticulosis. 9. Additional incidental findings, as above.  09/16/2017 Abdominal US IMPRESSION: 1. At least 2 large hepatic masses, both located within the RIGHT liver lobe, measuring 8 cm and 7.3 cm respectively. These are highly suspicious for neoplastic masses. If/when patient is clinically stable and able to follow directions and hold their breath (preferably as an outpatient) further evaluation with dedicated liver MRI should be considered. 2. Suspect underlying liver cirrhosis. 3. Small amount of ascites within the RIGHT upper quadrant. 4. Cholelithiasis without  convincing evidence of acute cholecystitis. No bile duct dilatation.   ASSESSMENT & PLAN:  [place] Re[place] is a 77 y.o. male with history of HTN and HLD, heavy smoking and alcohol abuse history, presented with 2 months history of leg swelling, abdominal discomfort, weakness and intermittent confusion.  1. 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 Image showed evidence of liver cirrhosis with portal hypertension, and a large hepatic mass measuring 7.5 x 10.0 x 12.7 cm, a 1.3 x 0.7 x 1.3 cm with intravascular invasion typical image findings of hepatocellular carcinoma, a nodule area of the left upper lobe of the lung which is suspicious for metastasis. I personally reviewed his image with patient and his family members in details.  -Given his typical image findings, and elevated AFP, he does not need tissue biopsy to confirm the diagnosis. Hep C serology negative -We previously discussed that his liver is not curable at this stage, and the goal of therapy is palliative. -Given the probable lung metastasis, he is not a candidate for liver targeted therapy -Discussed systemic options, including oral TKIs such as sorafenib, lenvatinib, cabozantinib, regorafenib, immunotherapy, and the recently new data of atezolizumab and bevacizumab in [facility] which showed great response (up to 60-70% partial response). -Given his decompensated liver function, with child Pugh score 9 (class B), and overall very poor performance status, in may not be able to tolerate sorafenib or Lenvatinib  -We previously discussed palliative care alone, due to his advanced age, poor performance status, and severely decompensated liver cirrhosis.  His overall  prognosis is guarded, expected life expectancy is likely a few months to several months. After lengthy discussion, patient and his family members would like him to try atezo and beva first  -He started Atexo 1200mg  and Beva 15 mg/kg q3 weeks on 10/05/17. He tolerated fairly well, but activity level remains low. His wife feels his fatigue and confusion were worse after the first treatment but he is tolerating treatment well overall. His strength is improving. He has no significant toxicities from treatment.    -He received flu  shot on 11/01/17.  -He is tolerating treatment well, no significant side effects. -He and his wife notes he is doing better, but has been losing weight. I referred him to dietician.  -CT scan in 1 week.  -If he has stable disease or partial response, will continue current treatment. He notes he is interested in continuing treatment.  If he has disease progression, we will discuss hospice and palliative care. -Labs revewied and adequate to proceed with treatment today  -F/u on 11/27   2. History of pneumonia, cellulitis -He initially presented with fever and bilateral effusion, treated with empiric antibiotics for pneumonia, completed 5 day course -all cultures negative -He had recurrent fever 101.9 and went to ED on 9/18, cultures negative; CXR showed hazy opacity at left base consistent with small layering left pleural effusion which was unchanged, and probable left basilar atelectasis and/or pneumonia, but no new abnormality. Dr. Alen Blew on call med onc was consulted and antibiotics were not recommended -he was also treated with oral antibiotics as outpt for recurrent fever  -He was recently admitted for left inguinal cellulitis, treated with antibiotics including fluconazole.  Much improved now. -he has been afebrile lately   3. Pleural effusions and leg edema - likely secondary to liver cirrhosis, worsened by hypoalbuminemia from Malignancy -2-D echocardiogram with EF of  50-55%, unable to assess diastolic parameters -continue Lasix 40 mg twice a day with potassium -Underwent thoracentesis on 8/21, which yielded 680 cc and cytology negative for malignancy -improved and stable  4. 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  -Lasix twice a dayat DC, hepatitis serology negative -He is not constipated, has bowel movement 2-3 times a day, not on lactulose.  Due to his recent constipation, I strongly encouraged him to restart lactulose. -Due to his recent hospitalization and encephalopathy, he was prescribed Xifaxan  -I previously referred him to liver clinic NP Dawn Drazak to manage his liver cirrhosis  5. Generalized weakness, deconditioning   -multifactorial -improving overall -continue home health care including PT -home health services set up, family concerned about need for more help, case manager consult requested  6. Cognitive decline -Family reports progressive symptoms of memory loss for past several months.  -B12, TSH unremarkable -His wife notes he can get confused at times, but he is mostly clear in his understanding.   7.Goal of care discussion  -We again discussed the incurable nature of his cancer, and the overall poor prognosis, especially if he does not have good response to systemic therapy -The patient understands the goal of care is palliative. -I recommend DNR/DNI, he will think about it     Plan Labs revewied and adequate to proceed with treatment today  Lab, f/u, treatment in 3 weeks with restaging CT, which is scheduled for next week    All questions were answered. The patient knows to call the clinic with any problems, questions or concerns. No barriers to learning was detected. I spent 25 minutes counseling the patient face to face. The total time  spent in the appointment was 30 minutes and more than 50% was on counseling and review of test results  I, Joslyn Devon, am acting as scribe for Truitt Merle, MD.   I have reviewed the above documentation for accuracy and completeness, and I agree with the above.       Krista Blue  Burr Medico, MD 12/06/2017

## 2017-12-06 NOTE — Patient Instructions (Signed)
[place] Discharge Instructions for Patients Receiving Chemotherapy  Today you received the following chemotherapy agents Avastin and Tecentriq  To help prevent nausea and vomiting after your treatment, we encourage you to take your nausea medication as directed.    If you develop nausea and vomiting that is not controlled by your nausea medication, call the clinic.   BELOW ARE SYMPTOMS THAT SHOULD BE REPORTED IMMEDIATELY:  *FEVER GREATER THAN 100.5 F  *CHILLS WITH OR WITHOUT FEVER  NAUSEA AND VOMITING THAT IS NOT CONTROLLED WITH YOUR NAUSEA MEDICATION  *UNUSUAL SHORTNESS OF BREATH  *UNUSUAL BRUISING OR BLEEDING  TENDERNESS IN MOUTH AND THROAT WITH OR WITHOUT PRESENCE OF ULCERS  *URINARY PROBLEMS  *BOWEL PROBLEMS  UNUSUAL RASH Items with * indicate a potential emergency and should be followed up as soon as possible.  Feel free to call the clinic should you have any questions or concerns. The clinic phone number is [phone].  Please show the [facility] at check-in to the Emergency Department and triage nurse.

## 2017-12-06 NOTE — Telephone Encounter (Signed)
No los 11/8

## 2017-12-06 NOTE — Progress Notes (Signed)
Per Dr. Burr Medico okay to treat with elevated liver enzymes

## 2017-12-07 LAB — AFP TUMOR MARKER: AFP, Serum, Tumor Marker: 117 ng/mL — ABNORMAL HIGH (ref 0.0–8.3)

## 2017-12-10 ENCOUNTER — Ambulatory Visit (HOSPITAL_COMMUNITY)
Admission: RE | Admit: 2017-12-10 | Discharge: 2017-12-10 | Disposition: A | Discharge status: Home / Self Care | Payer: Medicare Other | Source: Ambulatory Visit | Attending: Nurse Practitioner | Admitting: Nurse Practitioner

## 2017-12-10 ENCOUNTER — Ambulatory Visit (HOSPITAL_COMMUNITY): Payer: Medicare Other

## 2017-12-10 ENCOUNTER — Encounter (HOSPITAL_COMMUNITY): Payer: Self-pay

## 2017-12-10 DIAGNOSIS — K766 Portal hypertension: Secondary | ICD-10-CM | POA: Diagnosis not present

## 2017-12-10 DIAGNOSIS — R188 Other ascites: Secondary | ICD-10-CM | POA: Diagnosis not present

## 2017-12-10 DIAGNOSIS — J90 Pleural effusion, not elsewhere classified: Secondary | ICD-10-CM | POA: Insufficient documentation

## 2017-12-10 DIAGNOSIS — C220 Liver cell carcinoma: Secondary | ICD-10-CM

## 2017-12-10 MED ORDER — IOHEXOL 300 MG/ML  SOLN
100.0000 mL | Freq: Once | INTRAMUSCULAR | Status: AC | PRN
  Administered 2017-12-10: 100 mL via INTRAVENOUS

## 2017-12-10 MED ORDER — SODIUM CHLORIDE (PF) 0.9 % IJ SOLN
INTRAMUSCULAR | Status: AC
  Filled 2017-12-10: qty 50

## 2017-12-12 ENCOUNTER — Encounter: Payer: Self-pay | Admitting: General Practice

## 2017-12-12 NOTE — Progress Notes (Signed)
[place] CSW Progress Note  Call from wife, Janyce Llanos.  Experiencing significant caregiver stress, requests referrals.  CSW can refer to local provider and/or [facility] counseling intern, left VM requesting wife call back to direct referrals.  Also referred to [facility] via [place] 325for caregiver support groups which are monthly and ongoing resources in community.  Agency will reach out to wife directly.  Edwyna Shell, LCSW Clinical Social Worker Phone:  [phone]

## 2017-12-25 ENCOUNTER — Inpatient Hospital Stay (HOSPITAL_BASED_OUTPATIENT_CLINIC_OR_DEPARTMENT_OTHER): Payer: Medicare Other | Admitting: Nurse Practitioner

## 2017-12-25 ENCOUNTER — Inpatient Hospital Stay: Payer: Medicare Other

## 2017-12-25 ENCOUNTER — Encounter: Payer: Self-pay | Admitting: Nurse Practitioner

## 2017-12-25 VITALS — BP 140/70 | HR 60 | Temp 97.4°F | Resp 17 | Ht 68.0 in | Wt 153.9 lb

## 2017-12-25 DIAGNOSIS — C220 Liver cell carcinoma: Secondary | ICD-10-CM | POA: Diagnosis not present

## 2017-12-25 DIAGNOSIS — Z79899 Other long term (current) drug therapy: Secondary | ICD-10-CM

## 2017-12-25 DIAGNOSIS — R740 Nonspecific elevation of levels of transaminase and lactic acid dehydrogenase [LDH]: Secondary | ICD-10-CM | POA: Diagnosis not present

## 2017-12-25 DIAGNOSIS — G9341 Metabolic encephalopathy: Secondary | ICD-10-CM

## 2017-12-25 DIAGNOSIS — K766 Portal hypertension: Secondary | ICD-10-CM

## 2017-12-25 DIAGNOSIS — J90 Pleural effusion, not elsewhere classified: Secondary | ICD-10-CM | POA: Diagnosis not present

## 2017-12-25 DIAGNOSIS — E8809 Other disorders of plasma-protein metabolism, not elsewhere classified: Secondary | ICD-10-CM

## 2017-12-25 DIAGNOSIS — K7460 Unspecified cirrhosis of liver: Secondary | ICD-10-CM

## 2017-12-25 DIAGNOSIS — R17 Unspecified jaundice: Secondary | ICD-10-CM

## 2017-12-25 DIAGNOSIS — Z5112 Encounter for antineoplastic immunotherapy: Secondary | ICD-10-CM | POA: Diagnosis not present

## 2017-12-25 LAB — CBC WITH DIFFERENTIAL (CANCER CENTER ONLY)
ABS IMMATURE GRANULOCYTES: 0.02 10*3/uL (ref 0.00–0.07)
BASOS ABS: 0.1 10*3/uL (ref 0.0–0.1)
Basophils Relative: 2 %
EOS ABS: 0.7 10*3/uL — AB (ref 0.0–0.5)
Eosinophils Relative: 10 %
HEMATOCRIT: 46.4 % (ref 39.0–52.0)
HEMOGLOBIN: 15.2 g/dL (ref 13.0–17.0)
IMMATURE GRANULOCYTES: 0 %
LYMPHS PCT: 20 %
Lymphs Abs: 1.5 10*3/uL (ref 0.7–4.0)
MCH: 35 pg — ABNORMAL HIGH (ref 26.0–34.0)
MCHC: 32.8 g/dL (ref 30.0–36.0)
MCV: 106.9 fL — ABNORMAL HIGH (ref 80.0–100.0)
MONOS PCT: 10 %
Monocytes Absolute: 0.8 10*3/uL (ref 0.1–1.0)
NEUTROS ABS: 4.3 10*3/uL (ref 1.7–7.7)
NEUTROS PCT: 58 %
NRBC: 0 % (ref 0.0–0.2)
Platelet Count: 151 10*3/uL (ref 150–400)
RBC: 4.34 MIL/uL (ref 4.22–5.81)
RDW: 15.8 % — AB (ref 11.5–15.5)
WBC: 7.4 10*3/uL (ref 4.0–10.5)

## 2017-12-25 LAB — CMP (CANCER CENTER ONLY)
ALK PHOS: 307 U/L — AB (ref 38–126)
ALT: 202 U/L — ABNORMAL HIGH (ref 0–44)
AST: 137 U/L — ABNORMAL HIGH (ref 15–41)
Albumin: 2.1 g/dL — ABNORMAL LOW (ref 3.5–5.0)
Anion gap: 7 (ref 5–15)
BUN: 16 mg/dL (ref 8–23)
CALCIUM: 9 mg/dL (ref 8.9–10.3)
CHLORIDE: 108 mmol/L (ref 98–111)
CO2: 26 mmol/L (ref 22–32)
Creatinine: 0.71 mg/dL (ref 0.61–1.24)
Glucose, Bld: 94 mg/dL (ref 70–99)
Potassium: 4 mmol/L (ref 3.5–5.1)
SODIUM: 141 mmol/L (ref 135–145)
Total Bilirubin: 3.8 mg/dL (ref 0.3–1.2)
Total Protein: 7.3 g/dL (ref 6.5–8.1)

## 2017-12-25 LAB — TOTAL PROTEIN, URINE DIPSTICK: Protein, ur: NEGATIVE mg/dL

## 2017-12-25 LAB — PROTIME-INR
INR: 1.31
Prothrombin Time: 16.2 seconds — ABNORMAL HIGH (ref 11.4–15.2)

## 2017-12-25 NOTE — Progress Notes (Addendum)
Devin Reeves  [phone] [phone]  Clinic Follow up Note   Patient Care Team: Default, Provider, MD as PCP - General 12/25/2017  SUMMARY OF ONCOLOGIC HISTORY: Oncology History   Cancer Staging Hepatocellular carcinoma [facility]) Staging form: Liver, AJCC 8th Edition - Clinical stage from 10/01/2017: Stage IVB (cT4, cN0, cM1) - Signed by Devin Merle, MD on 10/01/2017       Hepatocellular carcinoma ([place])   09/16/2017 Imaging    09/16/2017 Abdominal US IMPRESSION: 1. At least 2 large hepatic masses, both located within the RIGHT liver lobe, measuring 8 cm and 7.3 cm respectively. These are highly suspicious for neoplastic masses. If/when patient is clinically stable and able to follow directions and hold their breath (preferably as an outpatient) further evaluation with dedicated liver MRI should be considered. 2. Suspect underlying liver cirrhosis. 3. Small amount of ascites within the RIGHT upper quadrant. 4. Cholelithiasis without convincing evidence of acute cholecystitis. No bile duct dilatation.    09/16/2017 - 09/22/2017 Hospital Admission    Admit date: 09/16/2017 Admission diagnosis: [facility] Additional comments: He was hospitalized on 09/16/2017 for weakness and fever, and was treated empirically for possible pneumonia and presumed sepsis. Abdominal US showed multiple liver lesions suspicious of malignancy and Dr. Alen Reeves was consulted. Patient was discharged on 09/22/2017.     09/17/2017 Imaging    09/17/2017 CT CAP IMPRESSION: 1. Large malignant-appearing mass in the right lobe of the liver measuring 7.5 x 10.0 x 12.7 cm. Given the underlying cirrhosis, this presumably reflects a primary hepatocellular carcinoma. 2. 1.3 x 0.7 x 1.3 cm nodule in the left upper lobe posteriorly. The possibility of a metastatic lesion should be considered, however, this may alternatively reflect an infectious or inflammatory nodule. Close attention on future  follow-up imaging is recommended. 3. Small to moderate volume of ascites. 4. Moderate to large bilateral pleural effusions lying dependently with associated passive subsegmental atelectasis in the lower lobes of the lungs bilaterally. 5. Mild cardiomegaly. 6. Aortic atherosclerosis, in addition to left main and 3 vessel coronary artery disease. Assessment for potential risk factor modification, dietary therapy or pharmacologic therapy may be warranted, if clinically indicated. 7. There are calcifications of the aortic valve and mitral annulus. Echocardiographic correlation for evaluation of potential valvular dysfunction may be warranted if clinically indicated. 8. Colonic diverticulosis. 9. Additional incidental findings, as above.     09/18/2017 Procedure    09/18/2017 Thoracocentesis  IMPRESSION: Successful ultrasound guided right thoracentesis yielding 680 mL of pleural fluid.    09/18/2017 Pathology Results    Pathology 09/18/2017 Cytology Diagnosis PLEURAL FLUID, RIGHT (SPECIMEN 1 OF 1, COLLECTED ON 09/18/17): NO MALIGNANT CELLS IDENTIFIED.    09/18/2017 Tumor Marker    Tumor Markers AFP Results for Devin Reeves (MRN [id number]) as of 09/30/2017 11:42  Ref. Range 09/18/2017 14:34  AFP, Serum, Tumor Marker Latest Ref Range: 0.0 - 8.3 ng/mL 78.6 (H)       09/20/2017 Imaging    09/20/2017 MRI Liver IMPRESSION: 1. Again demonstrated is a large hepatic mass with intravascular invasion involving the hepatic veins, IVC and portal vein. This remains most consistent with hepatocellular carcinoma. 2. Generalized soft tissue edema with ascites and bilateral pleural effusions. Right pleural effusion has decreased in volume from previous CT. 3. Underlying hepatic cirrhosis with multiple portosystemic collaterals consistent with portal hypertension.    09/30/2017 Initial Diagnosis    Hepatocellular carcinoma ([place])    10/01/2017 Cancer Staging    Staging form: Liver, AJCC 8th  Edition -  Clinical stage from 10/01/2017: Stage IVB (cT4, cN0, cM1) - Signed by Devin Merle, MD on 10/01/2017    10/05/2017 -  Chemotherapy    atezolizumab 1200 mg and bevacizumab 15 mg/kg q3 weeks, began 10/05/17     12/10/2017 Imaging    IMPRESSION: Chest Impression: 1. Persistent bilateral moderate pleural effusions which are increased from comparison exam. 2. No evidence of thoracic metastasis. Abdomen / Pelvis Impression: 1. Progressive hepatic tumor extension into the hepatic veins and IVC. 2. Interval increase in size of dominant mass in the superior RIGHT hepatic lobe. 3. Interval decrease in size and enhancement of large mass in the inferior posterior aspect the RIGHT hepatic lobe. 4. Interval increase in ascites. 5. Portal hypertension with extensive venous collaterals inferior to the spleen.    CURRENT THERAPY: atezolizumab 1200 mg and bevacizumab 15 mg/kg q3 weeks, began 10/05/17  INTERVAL HISTORY: Devin Reeves returns for follow up as scheduled. He completed cycle 4 atezolizumab and bevacizumab on 12/06/17. He underwent restaging CT on 12/10/17. He had some difficulty with the last cycle, felt more fatigued with decreased appetite, heartburn, and constipation for 4 days after treatment. Heartburn resolved with tums; constipation resolved with senna. He recovered well and has been active lately, doing more exercises at home and feeling more independent. He continues to have cough with clear sputum, at baseline, and heavy breathing. He denies chest pain or dyspnea. His abdomen seems smaller and softer, per his wife. Swelling in his legs is stable, he wears compression socks. Wife notes his eyes are slightly yellow, urine is yellow also. He denies abdominal pain, bleeding, neuropathy, or significant nausea or vomiting.    MEDICAL HISTORY:  Past Medical History:  Diagnosis Date  . Acne rosacea   . Hyperlipidemia   . Hypertension   . Liver cirrhosis, alcoholic ([place])   . Nocturia   .  Skin cancer     SURGICAL HISTORY: Past Surgical History:  Procedure Laterality Date  . prostectomy  2012  . TONSILLECTOMY      I have reviewed the social history and family history with the patient and they are unchanged from previous note.  ALLERGIES:  is allergic to oysters [shellfish allergy]; doxycycline; and hydroxyzine hcl.  MEDICATIONS:  Current Outpatient Medications  Medication Sig Dispense Refill  . acetaminophen (TYLENOL) 500 MG tablet Take 1,000 mg by mouth daily as needed for fever.    . Ascorbic Acid (VITAMIN C) 100 MG tablet Take 300 mg by mouth daily.    . feeding supplement, ENSURE ENLIVE, (ENSURE ENLIVE) LIQD Take 237 mLs by mouth 2 (two) times daily between meals. 237 mL 2  . furosemide (LASIX) 40 MG tablet Take 1 tablet (40 mg total) by mouth 2 (two) times daily. (Patient taking differently: Take 40 mg by mouth daily. ) 60 tablet 0  . Magnesium 250 MG TABS Take 250 mg by mouth once.    . potassium chloride SA (K-DUR,KLOR-CON) 20 MEQ tablet Take 2 tablets (40 mEq total) by mouth daily. (Patient taking differently: Take 20 mEq by mouth daily. ) 30 tablet 0  . triamcinolone cream (KENALOG) 0.1 % Apply 1 application topically as needed (rosacea).     . vitamin E 1000 UNIT capsule Take 1,000 Units by mouth daily.    Marland Kitchen XIFAXAN 550 MG TABS tablet Take 1 tablet (550 mg total) by mouth 2 (two) times daily. 60 tablet 2   No current facility-administered medications for this visit.     PHYSICAL EXAMINATION: ECOG PERFORMANCE STATUS: 3 -  Symptomatic, >50% confined to bed  Vitals:   12/25/17 1250  BP: 140/70  Pulse: 60  Resp: 17  Temp: (!) 97.4 F (36.3 C)  SpO2: 100%   Filed Weights   12/25/17 1250  Weight: 153 lb 14.4 oz (69.8 kg)    GENERAL:alert, no distress and comfortable. Requires assistance up to exam table.  SKIN: no rashes. Generalized bruising  EYES: slight scleral icterus  OROPHARYNX:no thrush or ulcers LYMPH:  no palpable cervical or  supraclavicular lymphadenopathy  LUNGS: clear to auscultation with normal breathing effort HEART: regular rate & rhythm; mild lower extremity edema ABDOMEN:abdomen soft, round, non-tender and normal bowel sounds. Distended, consistent with ascites. Hepatomegaly.  NEURO: alert & oriented x 3 with fluent speech.   LABORATORY DATA:  I have reviewed the data as listed CBC Latest Ref Rng & Units 12/25/2017 12/06/2017 11/25/2017  WBC 4.0 - 10.5 K/uL 7.4 5.3 6.1  Hemoglobin 13.0 - 17.0 g/dL 15.2 14.6 14.1  Hematocrit 39.0 - 52.0 % 46.4 44.7 43.4  Platelets 150 - 400 K/uL 151 121(L) 145(L)     CMP Latest Ref Rng & Units 12/25/2017 12/06/2017 11/27/2017  Glucose 70 - 99 mg/dL 94 157(H) -  BUN 8 - 23 mg/dL 16 16 -  Creatinine 0.61 - 1.24 mg/dL 0.71 0.74 0.82  Sodium 135 - 145 mmol/L 141 140 -  Potassium 3.5 - 5.1 mmol/L 4.0 3.9 -  Chloride 98 - 111 mmol/L 108 106 -  CO2 22 - 32 mmol/L 26 27 -  Calcium 8.9 - 10.3 mg/dL 9.0 9.1 -  Total Protein 6.5 - 8.1 g/dL 7.3 7.3 -  Total Bilirubin 0.3 - 1.2 mg/dL 3.8(HH) 2.6(H) -  Alkaline Phos 38 - 126 U/L 307(H) 267(H) -  AST 15 - 41 U/L 137(H) 115(H) -  ALT 0 - 44 U/L 202(H) 136(H) -   AFP:  09/18/17: 78.6 10/01/17: 86.4 11/01/17: 85.8 12/25/17: PENDING    RADIOGRAPHIC STUDIES: I have personally reviewed the radiological images as listed and agreed with the findings in the report. No results found.   ASSESSMENT & PLAN: Devin Reeves is a 77 y.o. male with history of HTN and HLD, heavy smoking and alcohol abuse history, presented with 2 months history of leg swelling, abdominal discomfort, weakness and intermittent confusion.  1. 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 He toleratedfairly well; His wife felt his fatigue and confusion were worse after the first treatment but he is tolerating treatment well overall. His strength is improving. He has no  significant toxicities from treatment.   -He received flu shot on 11/01/17.  -He had slightly more fatigue, decreased po intake, and GI symptoms after cycle 4, lasting 4 days; he has recovered well. He continues to increase activities and strength at home, he is becoming more independent. -he underwent restaging CT on 11/12 which was reviewed with him today. He has persistent moderate bilateral pleural effusions. There is progressive hepatic tumor extension into the hepatic veins and IVC and interval increase in ascites. There is mixed response in the liver with increase in size of dominant mass in the superior right lobe and decrease in size and enhancement of the large mass in the inferior posterior right lobe. The patient was seen with Dr. Burr Medico who reviewed the scan with him and feels it is overall stable. He is tolerating treatment moderately well overall, and recommends to continue current regimen. The patient agrees. -Labs  reviewed, CBC is stable, however he has worsening transaminitis and hyperbilirubinemia 3.8 on CMP today (after cycle 4), and mild juandice. Unclear if related to disease and/or treatment. No evidence of obstruction on CT -Will give him 2 week treatment break. He was encouraged to gain strength, improve nutrition and hydration in the meantime. I encouraged his spouse to call if he has new or worsening concerns, such as increased juandice.  -return for lab, f/u with Dr. Burr Medico, and treatment in 2 weeks, then plan to resume q3 weeks schedule   2.History of pneumonia, cellulitis -He initially presented with fever and bilateral effusion, treated with empiric antibiotics for pneumonia,completed 5 day course -all cultures negative -He had recurrent fever 101.9 and went to ED on 9/18, cultures negative; CXR showed hazy opacity at left base consistent with small layering left pleural effusion which was unchanged, and probable left basilar atelectasis and/or pneumonia, but no new  abnormality. Dr. Alen Reeves on call med onc was consulted and antibiotics were not recommended -he was also treated with oral antibiotics as outpt for recurrent fever  -He was recently admitted for left inguinal cellulitis, treated with antibiotics including fluconazole.  Much improved now. -resolved; he has been afebrile lately  3. Pleural effusions and leg edema - likely secondary to liver cirrhosis, worsened by hypoalbuminemia from Malignancy -2-D echocardiogram with EF of 50-55%, unable to assess diastolic parameters -continue Lasix 40 mg daily with potassium -Underwent thoracentesis on 8/21, which yielded 680 cc and cytology negative for malignancy -his cough and work of breathing are stable  4. Liver cirrhosis, child Pugh class B,  likely related to alcohol, portal hypertension and metabolic encephalopathy -pt has a heavy alcohol history when he was younger, he reports only drinking wine occasionally now.  -on lasix -hepatitis serology negative -constipation managed well with senna at this time; not on lactulose  -Due to his recent hospitalization and encephalopathy, he was prescribed Xifaxan  -Previously referred to liver clinic NP Dawn Drazak to manage his liver cirrhosis  5. Generalized weakness, deconditioning  -multifactorial -improving overall -continue home health care including PT  6. Cognitive decline -progressive symptoms of memory loss for past several months.  -B12, TSH unremarkable -Improved overall  7.Goal of care discussion  -The patient understands the goal of care is palliative.   Plan -Labs and CT reviewed, overall stable disease  -hold treatment today for transaminitis and hyperbilirubinemia  -Continue atezolizumab and bevacizumab q3 weeks, resume after 2 week break  -f/u in 2 weeks to resume treatment   All questions were answered. The patient knows to call the clinic with any problems, questions or concerns. No barriers to learning was  detected.     Alla Feeling, NP 12/25/17   Addendum  I have seen the patient, examined him. I agree with the assessment and and plan and have edited the notes.   Devin Reeves is clinically doing well, with more energy and better appetite, and has been more active at home. I reviewed his restaging CT scan in person and agree with the radiologist's interpretations. He has had mixed response in liver, overall stable disease, no new lesions.  Since he is clinically doing well, with possible pseudo-progression from immunotherapy, I recommend him to continue current therapy. He agrees. His lab reviewed, his total bilirubin has increased to 3.8 today, not sure if it is related to treatment, or related to his liver cirrhosis and oral cancer.  He does have significant fatigue for 3 to 4 days after treatment, and  tomorrow is Thanksgiving day, I recommend her to postpone today's treatment to 2 weeks later, to see if his liver function improves.  He agrees with the plan.  We will see him back in 2 weeks.  Devin Reeves  12/25/2017

## 2017-12-26 LAB — AFP TUMOR MARKER: AFP, Serum, Tumor Marker: 122 ng/mL — ABNORMAL HIGH (ref 0.0–8.3)

## 2017-12-27 ENCOUNTER — Telehealth: Payer: Self-pay

## 2017-12-27 NOTE — Telephone Encounter (Signed)
Spoke with patient concerning his upcoming appointment. Per 11/27 los. Mailed a letter with a calender enclosed.

## 2018-01-08 ENCOUNTER — Ambulatory Visit: Payer: Medicare Other | Admitting: Hematology

## 2018-01-08 ENCOUNTER — Other Ambulatory Visit: Payer: Medicare Other

## 2018-01-08 NOTE — Progress Notes (Signed)
[facility]   [phone] [phone]) [phone]   Clinic Follow up Note   Patient Care Team: Default, Provider, MD as PCP - General 01/09/2018  CHIEF COMPLAINT: F/u on [place]   SUMMARY OF ONCOLOGIC HISTORY: Oncology History   Cancer Staging Hepatocellular carcinoma [facility]) Staging form: Liver, AJCC 8th Edition - Clinical stage from 10/01/2017: Stage IVB (cT4, cN0, cM1) - Signed by Truitt Merle, MD on 10/01/2017       Hepatocellular carcinoma ([place])   09/16/2017 Imaging    09/16/2017 Abdominal US IMPRESSION: 1. At least 2 large hepatic masses, both located within the RIGHT liver lobe, measuring 8 cm and 7.3 cm respectively. These are highly suspicious for neoplastic masses. If/when patient is clinically stable and able to follow directions and hold their breath (preferably as an outpatient) further evaluation with dedicated liver MRI should be considered. 2. Suspect underlying liver cirrhosis. 3. Small amount of ascites within the RIGHT upper quadrant. 4. Cholelithiasis without convincing evidence of acute cholecystitis. No bile duct dilatation.    09/16/2017 - 09/22/2017 Hospital Admission    Admit date: 09/16/2017 Admission diagnosis: [facility] Additional comments: He was hospitalized on 09/16/2017 for weakness and fever, and was treated empirically for possible pneumonia and presumed sepsis. Abdominal US showed multiple liver lesions suspicious of malignancy and Dr. Alen Blew was consulted. Patient was discharged on 09/22/2017.     09/17/2017 Imaging    09/17/2017 CT CAP IMPRESSION: 1. Large malignant-appearing mass in the right lobe of the liver measuring 7.5 x 10.0 x 12.7 cm. Given the underlying cirrhosis, this presumably reflects a primary hepatocellular carcinoma. 2. 1.3 x 0.7 x 1.3 cm nodule in the left upper lobe posteriorly. The possibility of a metastatic lesion should be considered, however, this may alternatively reflect an infectious or inflammatory  nodule. Close attention on future follow-up imaging is recommended. 3. Small to moderate volume of ascites. 4. Moderate to large bilateral pleural effusions lying dependently with associated passive subsegmental atelectasis in the lower lobes of the lungs bilaterally. 5. Mild cardiomegaly. 6. Aortic atherosclerosis, in addition to left main and 3 vessel coronary artery disease. Assessment for potential risk factor modification, dietary therapy or pharmacologic therapy may be warranted, if clinically indicated. 7. There are calcifications of the aortic valve and mitral annulus. Echocardiographic correlation for evaluation of potential valvular dysfunction may be warranted if clinically indicated. 8. Colonic diverticulosis. 9. Additional incidental findings, as above.     09/18/2017 Procedure    09/18/2017 Thoracocentesis  IMPRESSION: Successful ultrasound guided right thoracentesis yielding 680 mL of pleural fluid.    09/18/2017 Pathology Results    Pathology 09/18/2017 Cytology Diagnosis PLEURAL FLUID, RIGHT (SPECIMEN 1 OF 1, COLLECTED ON 09/18/17): NO MALIGNANT CELLS IDENTIFIED.    09/18/2017 Tumor Marker    Tumor Markers AFP Results for Devin, Reeves (MRN [id number]) as of 09/30/2017 11:42  Ref. Range 09/18/2017 14:34  AFP, Serum, Tumor Marker Latest Ref Range: 0.0 - 8.3 ng/mL 78.6 (H)       09/20/2017 Imaging    09/20/2017 MRI Liver IMPRESSION: 1. Again demonstrated is a large hepatic mass with intravascular invasion involving the hepatic veins, IVC and portal vein. This remains most consistent with hepatocellular carcinoma. 2. Generalized soft tissue edema with ascites and bilateral pleural effusions. Right pleural effusion has decreased in volume from previous CT. 3. Underlying hepatic cirrhosis with multiple portosystemic collaterals consistent with portal hypertension.    09/30/2017 Initial Diagnosis    Hepatocellular carcinoma ([place])    10/01/2017 Cancer Staging  Staging form: Liver, AJCC 8th Edition - Clinical stage from 10/01/2017: Stage IVB (cT4, cN0, cM1) - Signed by Truitt Merle, MD on 10/01/2017    10/05/2017 -  Chemotherapy    atezolizumab 1200 mg and bevacizumab 15 mg/kg q3 weeks, began 10/05/17     12/10/2017 Imaging    IMPRESSION: Chest Impression: 1. Persistent bilateral moderate pleural effusions which are increased from comparison exam. 2. No evidence of thoracic metastasis. Abdomen / Pelvis Impression: 1. Progressive hepatic tumor extension into the hepatic veins and IVC. 2. Interval increase in size of dominant mass in the superior RIGHT hepatic lobe. 3. Interval decrease in size and enhancement of large mass in the inferior posterior aspect the RIGHT hepatic lobe. 4. Interval increase in ascites. 5. Portal hypertension with extensive venous collaterals inferior to the spleen.     CURRENT THERAPY atezolizumab 1200 mg and bevacizumab 15 mg/kg q3 weeks, began 10/05/17   INTERVAL HISTORY: Devin Reeves is a 77 y.o. male who is here for follow-up. Today, he is here with his wife. His last treatment was held due to abnormal liver function. He is doing well today and is eating well. He denies any pains and is trying to stay active. His urine is light in color, but is not sure because his toilet light changes in color and makes it hard to know the color of urine. His wife says that he still coughs and wheezes occasionally.    Pertinent positives and negatives of review of systems are listed and detailed within the above HPI.  REVIEW OF SYSTEMS:   Constitutional: Denies fevers, chills or abnormal weight loss Eyes: Denies blurriness of vision Ears, nose, mouth, throat, and face: Denies mucositis or sore throat Respiratory: Denies dyspnea (+) occasional wheezes and cough Cardiovascular: Denies palpitation, chest discomfort or lower extremity swelling Gastrointestinal:  Denies nausea, heartburn or change in bowel habits Skin: Denies abnormal  skin rashes Lymphatics: Denies new lymphadenopathy or easy bruising Neurological:Denies numbness, tingling or new weaknesses Behavioral/Psych: Mood is stable, no new changes  All other systems were reviewed with the patient and are negative.  MEDICAL HISTORY:  Past Medical History:  Diagnosis Date  . Acne rosacea   . Hyperlipidemia   . Hypertension   . Liver cirrhosis, alcoholic ([place])   . Nocturia   . Skin cancer     SURGICAL HISTORY: Past Surgical History:  Procedure Laterality Date  . prostectomy  2012  . TONSILLECTOMY      I have reviewed the social history and family history with the patient and they are unchanged from previous note.  ALLERGIES:  is allergic to oysters [shellfish allergy]; doxycycline; and hydroxyzine hcl.  MEDICATIONS:  Current Outpatient Medications  Medication Sig Dispense Refill  . acetaminophen (TYLENOL) 500 MG tablet Take 1,000 mg by mouth daily as needed for fever.    . Ascorbic Acid (VITAMIN C) 100 MG tablet Take 300 mg by mouth daily.    . feeding supplement, ENSURE ENLIVE, (ENSURE ENLIVE) LIQD Take 237 mLs by mouth 2 (two) times daily between meals. 237 mL 2  . furosemide (LASIX) 40 MG tablet Take 1 tablet (40 mg total) by mouth 2 (two) times daily. (Patient taking differently: Take 40 mg by mouth daily. ) 60 tablet 0  . Magnesium 250 MG TABS Take 250 mg by mouth once.    . potassium chloride SA (K-DUR,KLOR-CON) 20 MEQ tablet Take 2 tablets (40 mEq total) by mouth daily. (Patient taking differently: Take 20 mEq by mouth daily. ) 30  tablet 0  . spironolactone (ALDACTONE) 50 MG tablet Take 50 mg by mouth daily.    Marland Kitchen triamcinolone cream (KENALOG) 0.1 % Apply 1 application topically as needed (rosacea).     Marland Kitchen XIFAXAN 550 MG TABS tablet Take 1 tablet (550 mg total) by mouth 2 (two) times daily. 60 tablet 2   No current facility-administered medications for this visit.     PHYSICAL EXAMINATION: ECOG PERFORMANCE STATUS: 3 - Symptomatic, >50%  confined to bed  Vitals:   01/09/18 0828  BP: (!) 157/62  Pulse: (!) 58  Resp: 17  SpO2: 100%   Filed Weights   01/09/18 0828  Weight: 165 lb 4.8 oz (75 kg)    GENERAL:alert, no distress and comfortable (+) uses cane to walk  SKIN: skin color, texture, turgor are normal, no rashes or significant lesions EYES: normal, Conjunctiva are pink and non-injected, (+) mild scleral icterus OROPHARYNX:no exudate, no erythema and lips, buccal mucosa, and tongue normal  NECK: supple, thyroid normal size, non-tender, without nodularity LYMPH:  no palpable lymphadenopathy in the cervical, axillary or inguinal LUNGS: clear to auscultation and percussion with normal breathing effort HEART: regular rate & rhythm and no murmurs and no lower extremity edema ABDOMEN:abdomen soft, non-tender and normal bowel sounds (+) mild ascitics  Musculoskeletal:no cyanosis of digits and no clubbing  NEURO: alert & oriented x 3 with fluent speech, no focal motor/sensory deficits  LABORATORY DATA:  I have reviewed the data as listed CBC Latest Ref Rng & Units 01/09/2018 12/25/2017 12/06/2017  WBC 4.0 - 10.5 K/uL 6.3 7.4 5.3  Hemoglobin 13.0 - 17.0 g/dL 14.0 15.2 14.6  Hematocrit 39.0 - 52.0 % 42.7 46.4 44.7  Platelets 150 - 400 K/uL 144(L) 151 121(L)     CMP Latest Ref Rng & Units 01/09/2018 12/25/2017 12/06/2017  Glucose 70 - 99 mg/dL 150(H) 94 157(H)  BUN 8 - 23 mg/dL 19 16 16   Creatinine 0.61 - 1.24 mg/dL 0.92 0.71 0.74  Sodium 135 - 145 mmol/L 142 141 140  Potassium 3.5 - 5.1 mmol/L 3.8 4.0 3.9  Chloride 98 - 111 mmol/L 104 108 106  CO2 22 - 32 mmol/L 32 26 27  Calcium 8.9 - 10.3 mg/dL 8.4(L) 9.0 9.1  Total Protein 6.5 - 8.1 g/dL 6.7 7.3 7.3  Total Bilirubin 0.3 - 1.2 mg/dL 4.5(HH) 3.8(HH) 2.6(H)  Alkaline Phos 38 - 126 U/L 331(H) 307(H) 267(H)  AST 15 - 41 U/L 139(H) 137(H) 115(H)  ALT 0 - 44 U/L 182(H) 202(H) 136(H)   Tumor Marker AFP:  09/18/17: 78.6 10/01/17: 86.4 11/01/17: 85.8 12/25/17:  122.0   RADIOGRAPHIC STUDIES: I have personally reviewed the radiological images as listed and agreed with the findings in the report. No results found.   ASSESSMENT & PLAN:  Devin Reeves is a 77 y.o. male with history of  1. 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 Currently on first line atezolizumab 1200 mg and bevacizumab 15 mg/kg q3 weeks. Tolerating well overall with mild nausea, fatigue, heartburn, and constipation. -Labs reviewed, CBC showed Hg 14.0 MCV 108.4 PLT 144K. CMP showed bilirubin 4.5, albumin 1.8 AST 139 and ALT 182. TSH pending.  His ferritin has been trending up lately, treatment was held 2 weeks ago.  Due to the worsening hyperbilirubinemia, I will obtain a ultrasound of history of today or tomorrow, and hold chemotherapy -I explained to him that his elevated bilirubin might be due to worsening livre cancer, autoimmune hepatitis from immunotherapy, or a  side effect of medications. He understands. -There was mild scleral icterus and abdominal ascitics today on examination -Will order an abdominal US today -will f/u after [place].  If he does have biliary obstruction, will consider GI or IR evaluation.  2. Pleural effusions and leg edema -Related to [facility] and associated hypoalbuminemia. -Currently on Lasix 40 mg daily with potassium.  -Last thoracocentesis was on 09/18/2017 -Lungs clear today  3. 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 Hepatitis serology was negative. -Was prescribed Xifaxan after hospitalization for encephalopathy. -I previously referred to liver clinic with NP Dawn Drazak. He has been following her   4. Goal of care discussion  -The patient understands the goal of care is palliative  5. Cognitive decline -Progressive memory loss noted for the last several months. -B12 TSH were normal -Stable    Plan  -Due to his worsening hyperbilirubinemia, will hold treatment today. -will order an abdominal US to be done tomorrow morning.  I will see him after the [place] to determine next step.   No problem-specific Assessment & Plan notes found for this encounter.   Orders Placed This Encounter  Procedures  . US Abdomen Limited    Standing Status:   Future    Standing Expiration Date:   01/09/2019    Order Specific Question:   Reason for Exam (SYMPTOM  OR DIAGNOSIS REQUIRED)    Answer:   jaundice, rule out biliary obstruction    Order Specific Question:   Preferred imaging location?    Answer:   [facility]   All questions were answered. The patient knows to call the clinic with any problems, questions or concerns. No barriers to learning was detected. I spent 20 minutes counseling the patient face to face. The total time spent in the appointment was 25 minutes and more than 50% was on counseling and review of test results  I, Noor Dweik am acting as scribe for Dr. Truitt Merle.  I have reviewed the above documentation for accuracy and completeness, and I agree with the above.     Truitt Merle, MD 01/09/2018

## 2018-01-09 ENCOUNTER — Inpatient Hospital Stay (HOSPITAL_BASED_OUTPATIENT_CLINIC_OR_DEPARTMENT_OTHER): Payer: Medicare Other | Admitting: Hematology

## 2018-01-09 ENCOUNTER — Encounter: Payer: Self-pay | Admitting: Hematology

## 2018-01-09 ENCOUNTER — Inpatient Hospital Stay: Payer: Medicare Other | Attending: Hematology

## 2018-01-09 VITALS — BP 157/62 | HR 58 | Resp 17 | Ht 68.0 in | Wt 165.3 lb

## 2018-01-09 DIAGNOSIS — Z79899 Other long term (current) drug therapy: Secondary | ICD-10-CM | POA: Insufficient documentation

## 2018-01-09 DIAGNOSIS — R413 Other amnesia: Secondary | ICD-10-CM | POA: Insufficient documentation

## 2018-01-09 DIAGNOSIS — C220 Liver cell carcinoma: Secondary | ICD-10-CM | POA: Diagnosis present

## 2018-01-09 DIAGNOSIS — R600 Localized edema: Secondary | ICD-10-CM | POA: Diagnosis not present

## 2018-01-09 DIAGNOSIS — Z9221 Personal history of antineoplastic chemotherapy: Secondary | ICD-10-CM | POA: Insufficient documentation

## 2018-01-09 DIAGNOSIS — E8809 Other disorders of plasma-protein metabolism, not elsewhere classified: Secondary | ICD-10-CM

## 2018-01-09 DIAGNOSIS — J90 Pleural effusion, not elsewhere classified: Secondary | ICD-10-CM | POA: Insufficient documentation

## 2018-01-09 DIAGNOSIS — I10 Essential (primary) hypertension: Secondary | ICD-10-CM

## 2018-01-09 DIAGNOSIS — G9340 Encephalopathy, unspecified: Secondary | ICD-10-CM

## 2018-01-09 LAB — CMP (CANCER CENTER ONLY)
ALBUMIN: 1.8 g/dL — AB (ref 3.5–5.0)
ALK PHOS: 331 U/L — AB (ref 38–126)
ALT: 182 U/L — AB (ref 0–44)
ANION GAP: 6 (ref 5–15)
AST: 139 U/L — ABNORMAL HIGH (ref 15–41)
BUN: 19 mg/dL (ref 8–23)
CALCIUM: 8.4 mg/dL — AB (ref 8.9–10.3)
CHLORIDE: 104 mmol/L (ref 98–111)
CO2: 32 mmol/L (ref 22–32)
CREATININE: 0.92 mg/dL (ref 0.61–1.24)
GFR, Est AFR Am: >60 mL/min (ref >60)
Glucose, Bld: 150 mg/dL — ABNORMAL HIGH (ref 70–99)
POTASSIUM: 3.8 mmol/L (ref 3.5–5.1)
SODIUM: 142 mmol/L (ref 135–145)
Total Bilirubin: 4.5 mg/dL (ref 0.3–1.2)
Total Protein: 6.7 g/dL (ref 6.5–8.1)

## 2018-01-09 LAB — CBC WITH DIFFERENTIAL (CANCER CENTER ONLY)
Abs Immature Granulocytes: 0.03 10*3/uL (ref 0.00–0.07)
Basophils Absolute: 0.1 10*3/uL (ref 0.0–0.1)
Basophils Relative: 1 %
Eosinophils Absolute: 0.4 10*3/uL (ref 0.0–0.5)
Eosinophils Relative: 7 %
HEMATOCRIT: 42.7 % (ref 39.0–52.0)
HEMOGLOBIN: 14 g/dL (ref 13.0–17.0)
Immature Granulocytes: 1 %
LYMPHS ABS: 1.1 10*3/uL (ref 0.7–4.0)
Lymphocytes Relative: 18 %
MCH: 35.5 pg — ABNORMAL HIGH (ref 26.0–34.0)
MCHC: 32.8 g/dL (ref 30.0–36.0)
MCV: 108.4 fL — ABNORMAL HIGH (ref 80.0–100.0)
MONOS PCT: 10 %
Monocytes Absolute: 0.6 10*3/uL (ref 0.1–1.0)
NEUTROS PCT: 63 %
Neutro Abs: 4 10*3/uL (ref 1.7–7.7)
PLATELETS: 144 10*3/uL — AB (ref 150–400)
RBC: 3.94 MIL/uL — ABNORMAL LOW (ref 4.22–5.81)
RDW: 16.2 % — ABNORMAL HIGH (ref 11.5–15.5)
WBC Count: 6.3 10*3/uL (ref 4.0–10.5)
nRBC: 0 % (ref 0.0–0.2)

## 2018-01-09 LAB — TSH: TSH: 3.269 u[IU]/mL (ref 0.320–4.118)

## 2018-01-10 ENCOUNTER — Ambulatory Visit (HOSPITAL_COMMUNITY)
Admission: RE | Admit: 2018-01-10 | Discharge: 2018-01-10 | Disposition: A | Discharge status: Home / Self Care | Payer: Medicare Other | Source: Ambulatory Visit | Attending: Hematology | Admitting: Hematology

## 2018-01-10 ENCOUNTER — Other Ambulatory Visit: Payer: Self-pay | Admitting: Hematology

## 2018-01-10 ENCOUNTER — Telehealth: Payer: Self-pay | Admitting: Hematology

## 2018-01-10 ENCOUNTER — Inpatient Hospital Stay: Payer: Medicare Other

## 2018-01-10 DIAGNOSIS — C220 Liver cell carcinoma: Secondary | ICD-10-CM | POA: Insufficient documentation

## 2018-01-10 DIAGNOSIS — R188 Other ascites: Secondary | ICD-10-CM | POA: Insufficient documentation

## 2018-01-10 DIAGNOSIS — J90 Pleural effusion, not elsewhere classified: Secondary | ICD-10-CM | POA: Insufficient documentation

## 2018-01-10 DIAGNOSIS — K8020 Calculus of gallbladder without cholecystitis without obstruction: Secondary | ICD-10-CM | POA: Insufficient documentation

## 2018-01-10 MED ORDER — PREDNISONE 20 MG PO TABS: 60.0000 mg | ORAL_TABLET | Freq: Every day | ORAL | 0 refills | Status: DC

## 2018-01-10 MED ORDER — OMEPRAZOLE 40 MG PO CPDR: 40.0000 mg | DELAYED_RELEASE_CAPSULE | Freq: Every day | ORAL | 0 refills | Status: DC

## 2018-01-10 NOTE — Telephone Encounter (Signed)
Per 12/12 los. Patient last treatment is already scheduled.

## 2018-01-10 NOTE — Telephone Encounter (Signed)
Patient underwent ultrasound of abdomen this morning.  I called him in the late afternoon, and discussed the results with his wife.  There is no evidence of biliary obstruction or dilatation, due to known liver mass measures 8.2 and 6.9 cm.  I think his hyperbilirubinemia is likely related to his cancer, also I cannot rule out autoimmune hepatitis from [place].  I will call in prednisone 60 mg for 5 days, then 40 mg for 5 days, to see if he is liver function improves.  I plan to see him back on December 24 with lab. If no improvement, I will taper off prednisone quickly, and discuss hospice with patient. I recommend him to take omeprazole when he is on prednisone, I will call in today.  He agrees with the plan.  Truitt Merle  01/10/2018

## 2018-01-13 ENCOUNTER — Encounter (HOSPITAL_COMMUNITY): Payer: Self-pay

## 2018-01-13 ENCOUNTER — Telehealth: Payer: Self-pay

## 2018-01-13 ENCOUNTER — Inpatient Hospital Stay (HOSPITAL_COMMUNITY)
Admission: EM | Admit: 2018-01-13 | Discharge: 2018-01-15 | DRG: 432 | Disposition: A | Discharge status: Hospice | Payer: Medicare Other | Attending: Internal Medicine | Admitting: Internal Medicine

## 2018-01-13 ENCOUNTER — Emergency Department (HOSPITAL_COMMUNITY): Payer: Medicare Other

## 2018-01-13 ENCOUNTER — Other Ambulatory Visit: Payer: Self-pay

## 2018-01-13 DIAGNOSIS — C220 Liver cell carcinoma: Secondary | ICD-10-CM | POA: Diagnosis not present

## 2018-01-13 DIAGNOSIS — Z7189 Other specified counseling: Secondary | ICD-10-CM | POA: Diagnosis not present

## 2018-01-13 DIAGNOSIS — Z9221 Personal history of antineoplastic chemotherapy: Secondary | ICD-10-CM

## 2018-01-13 DIAGNOSIS — G9341 Metabolic encephalopathy: Secondary | ICD-10-CM | POA: Diagnosis present

## 2018-01-13 DIAGNOSIS — Z6825 Body mass index (BMI) 25.0-25.9, adult: Secondary | ICD-10-CM

## 2018-01-13 DIAGNOSIS — K7040 Alcoholic hepatic failure without coma: Principal | ICD-10-CM | POA: Diagnosis present

## 2018-01-13 DIAGNOSIS — R627 Adult failure to thrive: Secondary | ICD-10-CM | POA: Diagnosis not present

## 2018-01-13 DIAGNOSIS — G929 Unspecified toxic encephalopathy: Secondary | ICD-10-CM | POA: Diagnosis present

## 2018-01-13 DIAGNOSIS — Z79899 Other long term (current) drug therapy: Secondary | ICD-10-CM

## 2018-01-13 DIAGNOSIS — R4182 Altered mental status, unspecified: Secondary | ICD-10-CM

## 2018-01-13 DIAGNOSIS — Z91013 Allergy to seafood: Secondary | ICD-10-CM

## 2018-01-13 DIAGNOSIS — Z66 Do not resuscitate: Secondary | ICD-10-CM | POA: Diagnosis present

## 2018-01-13 DIAGNOSIS — K766 Portal hypertension: Secondary | ICD-10-CM | POA: Diagnosis present

## 2018-01-13 DIAGNOSIS — K7290 Hepatic failure, unspecified without coma: Secondary | ICD-10-CM | POA: Diagnosis not present

## 2018-01-13 DIAGNOSIS — Z515 Encounter for palliative care: Secondary | ICD-10-CM | POA: Diagnosis present

## 2018-01-13 DIAGNOSIS — E876 Hypokalemia: Secondary | ICD-10-CM | POA: Diagnosis present

## 2018-01-13 DIAGNOSIS — G92 Toxic encephalopathy: Secondary | ICD-10-CM

## 2018-01-13 DIAGNOSIS — Z7952 Long term (current) use of systemic steroids: Secondary | ICD-10-CM

## 2018-01-13 DIAGNOSIS — E860 Dehydration: Secondary | ICD-10-CM | POA: Diagnosis present

## 2018-01-13 DIAGNOSIS — E785 Hyperlipidemia, unspecified: Secondary | ICD-10-CM | POA: Diagnosis present

## 2018-01-13 DIAGNOSIS — I10 Essential (primary) hypertension: Secondary | ICD-10-CM | POA: Diagnosis present

## 2018-01-13 DIAGNOSIS — J90 Pleural effusion, not elsewhere classified: Secondary | ICD-10-CM | POA: Diagnosis present

## 2018-01-13 DIAGNOSIS — K7030 Alcoholic cirrhosis of liver without ascites: Secondary | ICD-10-CM | POA: Diagnosis present

## 2018-01-13 DIAGNOSIS — E43 Unspecified severe protein-calorie malnutrition: Secondary | ICD-10-CM | POA: Diagnosis present

## 2018-01-13 DIAGNOSIS — Z87891 Personal history of nicotine dependence: Secondary | ICD-10-CM

## 2018-01-13 DIAGNOSIS — Z85828 Personal history of other malignant neoplasm of skin: Secondary | ICD-10-CM

## 2018-01-13 DIAGNOSIS — E162 Hypoglycemia, unspecified: Secondary | ICD-10-CM | POA: Diagnosis present

## 2018-01-13 LAB — URINALYSIS, ROUTINE W REFLEX MICROSCOPIC
BILIRUBIN URINE: NEGATIVE
Glucose, UA: NEGATIVE mg/dL
Hgb urine dipstick: NEGATIVE
KETONES UR: NEGATIVE mg/dL
LEUKOCYTES UA: NEGATIVE
NITRITE: NEGATIVE
PH: 7 (ref 5.0–8.0)
PROTEIN: NEGATIVE mg/dL
Specific Gravity, Urine: 1.018 (ref 1.005–1.030)

## 2018-01-13 LAB — CBC WITH DIFFERENTIAL/PLATELET
Abs Immature Granulocytes: 0.03 10*3/uL (ref 0.00–0.07)
BASOS ABS: 0 10*3/uL (ref 0.0–0.1)
BASOS PCT: 0 %
EOS ABS: 0.3 10*3/uL (ref 0.0–0.5)
EOS PCT: 3 %
HEMATOCRIT: 42.8 % (ref 39.0–52.0)
Hemoglobin: 14 g/dL (ref 13.0–17.0)
Immature Granulocytes: 0 %
LYMPHS ABS: 1 10*3/uL (ref 0.7–4.0)
Lymphocytes Relative: 12 %
MCH: 36.1 pg — ABNORMAL HIGH (ref 26.0–34.0)
MCHC: 32.7 g/dL (ref 30.0–36.0)
MCV: 110.3 fL — ABNORMAL HIGH (ref 80.0–100.0)
Monocytes Absolute: 1 10*3/uL (ref 0.1–1.0)
Monocytes Relative: 11 %
NRBC: 0 % (ref 0.0–0.2)
Neutro Abs: 6.8 10*3/uL (ref 1.7–7.7)
Neutrophils Relative %: 74 %
PLATELETS: 152 10*3/uL (ref 150–400)
RBC: 3.88 MIL/uL — AB (ref 4.22–5.81)
RDW: 16.5 % — AB (ref 11.5–15.5)
WBC: 9.1 10*3/uL (ref 4.0–10.5)

## 2018-01-13 LAB — COMPREHENSIVE METABOLIC PANEL
ALT: 169 U/L — ABNORMAL HIGH (ref 0–44)
ANION GAP: 5 (ref 5–15)
AST: 148 U/L — ABNORMAL HIGH (ref 15–41)
Albumin: 1.8 g/dL — ABNORMAL LOW (ref 3.5–5.0)
Alkaline Phosphatase: 236 U/L — ABNORMAL HIGH (ref 38–126)
BILIRUBIN TOTAL: 3.3 mg/dL — AB (ref 0.3–1.2)
BUN: 19 mg/dL (ref 8–23)
CHLORIDE: 107 mmol/L (ref 98–111)
CO2: 28 mmol/L (ref 22–32)
Calcium: 7.7 mg/dL — ABNORMAL LOW (ref 8.9–10.3)
Creatinine, Ser: 0.63 mg/dL (ref 0.61–1.24)
GFR calc Af Amer: >60 mL/min (ref >60)
GFR calc non Af Amer: >60 mL/min (ref >60)
GLUCOSE: 83 mg/dL (ref 70–99)
POTASSIUM: 4.7 mmol/L (ref 3.5–5.1)
Sodium: 140 mmol/L (ref 135–145)
TOTAL PROTEIN: 6 g/dL — AB (ref 6.5–8.1)

## 2018-01-13 LAB — AMMONIA: Ammonia: 61 umol/L — ABNORMAL HIGH (ref 9–35)

## 2018-01-13 LAB — I-STAT CHEM 8, ED
BUN: 14 mg/dL (ref 8–23)
Calcium, Ion: 0.87 mmol/L — CL (ref 1.15–1.40)
Chloride: 112 mmol/L — ABNORMAL HIGH (ref 98–111)
Creatinine, Ser: 0.4 mg/dL — ABNORMAL LOW (ref 0.61–1.24)
Glucose, Bld: 62 mg/dL — ABNORMAL LOW (ref 70–99)
HEMATOCRIT: 32 % — AB (ref 39.0–52.0)
HEMOGLOBIN: 10.9 g/dL — AB (ref 13.0–17.0)
Potassium: 3 mmol/L — ABNORMAL LOW (ref 3.5–5.1)
SODIUM: 146 mmol/L — AB (ref 135–145)
TCO2: 21 mmol/L — AB (ref 22–32)

## 2018-01-13 LAB — LIPASE, BLOOD: LIPASE: 47 U/L (ref 11–51)

## 2018-01-13 LAB — PROTIME-INR
INR: 1.41
PROTHROMBIN TIME: 17.1 s — AB (ref 11.4–15.2)

## 2018-01-13 MED ORDER — ONDANSETRON HCL 4 MG/2ML IJ SOLN: 4.0000 mg | Freq: Four times a day (QID) | INTRAMUSCULAR | Status: DC | PRN

## 2018-01-13 MED ORDER — FUROSEMIDE 10 MG/ML IJ SOLN
40.0000 mg | Freq: Every day | INTRAMUSCULAR | Status: DC
  Administered 2018-01-13 – 2018-01-14 (×2): 40 mg via INTRAVENOUS
  Filled 2018-01-13 (×2): qty 4

## 2018-01-13 MED ORDER — ACETAMINOPHEN 650 MG RE SUPP: 650.0000 mg | Freq: Four times a day (QID) | RECTAL | Status: DC | PRN

## 2018-01-13 MED ORDER — ONDANSETRON HCL 4 MG PO TABS: 4.0000 mg | ORAL_TABLET | Freq: Four times a day (QID) | ORAL | Status: DC | PRN

## 2018-01-13 MED ORDER — SODIUM CHLORIDE 0.9 % IV BOLUS
500.0000 mL | Freq: Once | INTRAVENOUS | Status: AC
  Administered 2018-01-13: 500 mL via INTRAVENOUS

## 2018-01-13 MED ORDER — LACTULOSE ENEMA
300.0000 mL | Freq: Once | ORAL | Status: DC
  Filled 2018-01-13: qty 300

## 2018-01-13 MED ORDER — SODIUM CHLORIDE 0.9 % IV SOLN: INTRAVENOUS | Status: DC

## 2018-01-13 MED ORDER — ACETAMINOPHEN 325 MG PO TABS: 650.0000 mg | ORAL_TABLET | Freq: Four times a day (QID) | ORAL | Status: DC | PRN

## 2018-01-13 MED ORDER — DEXTROSE-NACL 5-0.9 % IV SOLN
INTRAVENOUS | Status: DC
  Administered 2018-01-13 – 2018-01-14 (×2): via INTRAVENOUS

## 2018-01-13 MED ORDER — MORPHINE SULFATE (PF) 2 MG/ML IV SOLN: 1.0000 mg | INTRAVENOUS | Status: DC | PRN

## 2018-01-13 MED ORDER — DEXTROSE 50 % IV SOLN
1.0000 | Freq: Once | INTRAVENOUS | Status: AC
  Administered 2018-01-13: 50 mL via INTRAVENOUS
  Filled 2018-01-13: qty 50

## 2018-01-13 NOTE — ED Provider Notes (Signed)
[facility] DEPT Provider Note   CSN: [id number] Arrival date & time: 01/13/18  1010     History   Chief Complaint Chief Complaint  Patient presents with  . Altered Mental Status    HPI Vitali Seibert is a 77 y.o. male.  Patient with end-stage liver cancer.  A DNR.  Patient has not started hospice care yet.  Patient's oncologist is Dr. Annamaria Boots.  Patient today had altered mental status very less responsive not been eating or drinking very well for the past couple days.     Past Medical History:  Diagnosis Date  . Acne rosacea   . Hyperlipidemia   . Hypertension   . Liver cirrhosis, alcoholic ([place])   . Nocturia   . Skin cancer     Patient Active Problem List   Diagnosis Date Noted  . Failure to thrive in adult 01/13/2018  . Hypoglycemia 01/13/2018  . Altered mental status   . Hypokalemia   . Scrotal swelling   . Cellulitis 11/23/2017  . Candida infection 11/23/2017  . Anasarca   . Goals of care, counseling/discussion 10/01/2017  . Hepatocellular carcinoma ([place]) 09/30/2017  . Protein-calorie malnutrition, severe 09/21/2017  . Liver masses 09/17/2017  . Sepsis due to pneumonia ([employer]) 09/16/2017  . Elevated liver function tests 09/16/2017  . Weakness 09/16/2017  . Toxic encephalopathy 09/16/2017  . Nocturia     Past Surgical History:  Procedure Laterality Date  . prostectomy  2012  . TONSILLECTOMY          Home Medications    Prior to Admission medications   Medication Sig Start Date End Date Taking? Authorizing Provider  acetaminophen (TYLENOL) 500 MG tablet Take 1,000 mg by mouth daily as needed for fever.    [provider]  Ascorbic Acid (VITAMIN C) 100 MG tablet Take 300 mg by mouth daily.    [provider]  feeding supplement, ENSURE ENLIVE, (ENSURE ENLIVE) LIQD Take 237 mLs by mouth 2 (two) times daily between meals. 09/22/17   Domenic Polite, MD  furosemide (LASIX) 40 MG tablet Take 1 tablet (40  mg total) by mouth 2 (two) times daily. Patient taking differently: Take 40 mg by mouth daily.  09/22/17   Domenic Polite, MD  Magnesium 250 MG TABS Take 250 mg by mouth once.    [provider]  omeprazole (PRILOSEC) 40 MG capsule TAKE 1 CAPSULE(40 MG) BY MOUTH DAILY 01/13/18   Truitt Merle, MD  potassium chloride SA (K-DUR,KLOR-CON) 20 MEQ tablet Take 2 tablets (40 mEq total) by mouth daily. Patient taking differently: Take 20 mEq by mouth daily.  09/22/17   Domenic Polite, MD  predniSONE (DELTASONE) 20 MG tablet Take 3 tablets (60 mg total) by mouth daily with breakfast. Take 3 tabs daily with breakfast for 5 days then change to 2 tabs daily for 5 days. 01/10/18   Truitt Merle, MD  spironolactone (ALDACTONE) 50 MG tablet Take 50 mg by mouth daily.    [provider]  triamcinolone cream (KENALOG) 0.1 % Apply 1 application topically as needed (rosacea).     [provider]  XIFAXAN 550 MG TABS tablet Take 1 tablet (550 mg total) by mouth 2 (two) times daily. 12/06/17   Truitt Merle, MD    Family History Family History  Problem Relation Age of Onset  . Cancer Maternal Grandfather        bone cancer    Social History Social History   Tobacco Use  . Smoking status:  Former Smoker    Packs/day: 2.00    Years: 20.00    Pack years: 40.00  . Smokeless tobacco: Never Used  . Tobacco comment: quit in 1999  Substance Use Topics  . Alcohol use: Yes    Alcohol/week: 14.0 standard drinks    Types: 14 Glasses of wine per week    Comment: quit completely in 06/2017  . Drug use: No     Allergies   Oysters [shellfish allergy]; Doxycycline; and Hydroxyzine hcl   Review of Systems Review of Systems  Unable to perform ROS: Mental status change     Physical Exam Updated Vital Signs BP (!) 149/70   Pulse 70   Temp (!) 96 F (35.6 C) (Rectal)   Resp 19   SpO2 95%   Physical Exam Vitals signs and nursing note reviewed.  Constitutional:      Appearance: He is  ill-appearing.  HENT:     Head: Normocephalic and atraumatic.     Mouth/Throat:     Mouth: Mucous membranes are dry.  Eyes:     Conjunctiva/sclera: Conjunctivae normal.     Pupils: Pupils are equal, round, and reactive to light.  Neck:     Musculoskeletal: Neck supple.  Cardiovascular:     Rate and Rhythm: Normal rate and regular rhythm.     Pulses: Normal pulses.     Heart sounds: Normal heart sounds.  Pulmonary:     Effort: No respiratory distress.     Breath sounds: Normal breath sounds.  Abdominal:     Palpations: Abdomen is soft.     Tenderness: There is no abdominal tenderness.  Musculoskeletal:        General: Swelling present.  Skin:    General: Skin is warm.  Neurological:     Comments: Patient with depressed mental status.  But he will wake up but not able to recognize family members.  Will look around.  Not able to follow commands.      ED Treatments / Results  Labs (all labs ordered are listed, but only abnormal results are displayed) Labs Reviewed  URINALYSIS, ROUTINE W REFLEX MICROSCOPIC - Abnormal; Notable for the following components:      Result Value   Color, Urine AMBER (*)    All other components within normal limits  CBC WITH DIFFERENTIAL/PLATELET - Abnormal; Notable for the following components:   RBC 3.88 (*)    MCV 110.3 (*)    MCH 36.1 (*)    RDW 16.5 (*)    All other components within normal limits  COMPREHENSIVE METABOLIC PANEL - Abnormal; Notable for the following components:   Calcium 7.7 (*)    Total Protein 6.0 (*)    Albumin 1.8 (*)    AST 148 (*)    ALT 169 (*)    Alkaline Phosphatase 236 (*)    Total Bilirubin 3.3 (*)    All other components within normal limits  AMMONIA - Abnormal; Notable for the following components:   Ammonia 61 (*)    All other components within normal limits  PROTIME-INR - Abnormal; Notable for the following components:   Prothrombin Time 17.1 (*)    All other components within normal limits  I-STAT  CHEM 8, ED - Abnormal; Notable for the following components:   Sodium 146 (*)    Potassium 3.0 (*)    Chloride 112 (*)    Creatinine, Ser 0.40 (*)    Glucose, Bld 62 (*)    Calcium, Ion 0.87 (*)  TCO2 21 (*)    Hemoglobin 10.9 (*)    HCT 32.0 (*)    All other components within normal limits  LIPASE, BLOOD  CBG MONITORING, ED    EKG None  Radiology Ct Head Wo Contrast  Result Date: 01/13/2018 CLINICAL DATA:  Altered mental status with decreased responsiveness today. History of cirrhosis and liver mass. EXAM: CT HEAD WITHOUT CONTRAST TECHNIQUE: Contiguous axial images were obtained from the base of the skull through the vertex without intravenous contrast. COMPARISON:  09/10/2017 FINDINGS: Brain: There is no evidence of acute infarct, intracranial hemorrhage, mass, midline shift, or extra-axial fluid collection. 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 Cerebral white matter hypodensities are similar to the prior study and nonspecific but compatible with mild chronic small vessel ischemic disease. There is mild cerebral atrophy. Vascular: Calcified atherosclerosis at the skull base. No hyperdense vessel. Skull: No fracture or focal osseous lesion. Sinuses/Orbits: Left sphenoid sinus mucous retention cyst or polyp. Clear mastoid air cells. Bilateral cataract extraction. Other: None. IMPRESSION: 1. No evidence of acute intracranial abnormality. 2. Mild chronic small vessel ischemic disease. Electronically Signed   By: Logan Bores M.D.   On: 01/13/2018 11:52   Dg Chest Port 1 View  Result Date: 01/13/2018 CLINICAL DATA:  Altered mental status. EXAM: PORTABLE CHEST 1 VIEW COMPARISON:  Radiographs of October 16, 2017. FINDINGS: Stable cardiomegaly is noted. Increased central pulmonary vascular congestion is noted with probable bilateral pulmonary edema. Mild bilateral pleural effusions are noted as well. No pneumothorax is noted. Bony thorax is  unremarkable. IMPRESSION: Stable cardiomegaly with increased central pulmonary vascular congestion and probable bilateral pulmonary edema and pleural effusions. Electronically Signed   By: Marijo Conception, M.D.   On: 01/13/2018 12:13    Procedures Procedures (including critical care time)  Medications Ordered in ED Medications  dextrose 5 %-0.9 % sodium chloride infusion ( Intravenous New Bag/Given 01/13/18 1159)  sodium chloride 0.9 % bolus 500 mL (0 mLs Intravenous Stopped 01/13/18 1202)  dextrose 50 % solution 50 mL (50 mLs Intravenous Given 01/13/18 1159)     Initial Impression / Assessment and Plan / ED Course  I have reviewed the triage vital signs and the nursing notes.  Pertinent labs & imaging results that were available during my care of the patient were reviewed by me and considered in my medical decision making (see chart for details).     Work-up for the altered mental status.  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  Discussed with his oncologist Dr. Annamaria Boots.  She is recommending admission for hydration and moving him into hospice care making arrangements at home for hospice care.  There are no further treatments for his cancer he is end-stage.  Patient is a DNR.  Here patient's blood sugar was around 62.  Patient did get some fluids and got an amp of D50 and he did become more alert.  Chest x-ray shows some concerns for bilateral pleural effusions head CT was no acute findings.  Hospitalist will admit.  Final Clinical Impressions(s) / ED Diagnoses   Final diagnoses:  Hepatocellular carcinoma ([place])  Altered mental status, unspecified altered mental status type  Failure to thrive in adult    ED Discharge Orders    None       Fredia Sorrow, MD 01/13/18 1700

## 2018-01-13 NOTE — H&P (Signed)
TRH H&P   Patient Demographics:    Devin Reeves, is a 77 y.o. male  MRN: [id number]   DOB - [birth year]  Admit Date - 01/13/2018  Outpatient Primary MD for the patient is Default, Provider, MD  Referring MD Dr. Rogene Houston  Outpatient Specialists: Dr. Burr Medico (oncology)  Patient coming from: Home  Chief Complaint  Patient presents with  . Altered Mental Status      HPI:    Devin Reeves  is a 77 y.o. male, with hepatocellular carcinoma diagnosed in August 2019 (was initially hospitalized for pneumonia with possible sepsis with abdominal ultrasound showing multiple liver lesions).  His initial staging was stage IVb.  In September he was started on atezolizumab and bevacizumab.  He appeared to tolerate treatment well initially.  Follow-up imaging 1 month back showed bilateral pleural effusion and progressive hepatic tumor extension into the hepatic veins and IVC.  There was also increase in size in the dominant mass in the right hepatic lobe.  He was seen by his oncologist 4 days back and his last treatment was held due to worsened LFTs.  Patient over the past few weeks also showing cognitive decline with overall failure to thrive including nausea, constipation heartburn, poor appetite and generalized fatigue. Patient also having features of pleural effusion and bilateral leg edema along with metabolic encephalopathy. Oncologist had discussed with patient and his wife that the treatment would be palliative and they had agreed. Patient brought to the ED today by his wife as he has shown progressive decline in his function, confused and poorly responsive.  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 In the ED he was somnolent  and poorly arousable.  CBG was in the 60s.  A blood work done showed normal WBC, hemoglobin dropped to 10.9 from baseline around 14, normal platelets.  Chemistry showed albumin of 1.8, transaminitis with AST of 148, ALT of 169, ALK of 236 and total bilirubin of 3.3 (not worsened from last lab). Head CT negative for acute findings.  Chest x-ray showed pulmonary vascular congestion and bilateral pulmonary edema with pleural effusions. Dr. Burr Medico consulted by ED physician who recommended consult hospice.  I discussed with patient's wife at bedside who is interested in discussing with hospice and would like to take him home with home hospice.       Review of systems:    In addition to the HPI above,  No Fever-chills, confusion, generalized weakness, No Headache, No changes with Vision or hearing, Poor p.o. intake No Chest pain, Cough, shortness of breath + No abdominal pain, no nausea or vomiting, no diarrhea No Blood in stool or Urine, No dysuria, No new skin rashes or bruises, No new joints pains-aches,  No tingling, numbness in  any extremity, Weight loss + No polyuria, polydypsia or polyphagia,   With Past History of the following :    Past Medical History:  Diagnosis Date  . Acne rosacea   . Hyperlipidemia   . Hypertension   . Liver cirrhosis, alcoholic ([place])   . Nocturia   . Skin cancer       Past Surgical History:  Procedure Laterality Date  . prostectomy  2012  . TONSILLECTOMY        Social History:     Social History   Tobacco Use  . Smoking status: Former Smoker    Packs/day: 2.00    Years: 20.00    Pack years: 40.00  . Smokeless tobacco: Never Used  . Tobacco comment: quit in 1999  Substance Use Topics  . Alcohol use: Yes    Alcohol/week: 14.0 standard drinks    Types: 14 Glasses of wine per week    Comment: quit completely in 06/2017     Lives -home with wife Mobility -poorly ambulatory in the last few days.     Family History :     Family  History  Problem Relation Age of Onset  . Cancer Maternal Grandfather        bone cancer      Home Medications:   Prior to Admission medications   Medication Sig Start Date End Date Taking? Authorizing Provider  acetaminophen (TYLENOL) 500 MG tablet Take 1,000 mg by mouth daily as needed for fever.    [provider]  Ascorbic Acid (VITAMIN C) 100 MG tablet Take 300 mg by mouth daily.    [provider]  feeding supplement, ENSURE ENLIVE, (ENSURE ENLIVE) LIQD Take 237 mLs by mouth 2 (two) times daily between meals. 09/22/17   Domenic Polite, MD  furosemide (LASIX) 40 MG tablet Take 1 tablet (40 mg total) by mouth 2 (two) times daily. Patient taking differently: Take 40 mg by mouth daily.  09/22/17   Domenic Polite, MD  Magnesium 250 MG TABS Take 250 mg by mouth once.    [provider]  omeprazole (PRILOSEC) 40 MG capsule TAKE 1 CAPSULE(40 MG) BY MOUTH DAILY 01/13/18   Truitt Merle, MD  potassium chloride SA (K-DUR,KLOR-CON) 20 MEQ tablet Take 2 tablets (40 mEq total) by mouth daily. Patient taking differently: Take 20 mEq by mouth daily.  09/22/17   Domenic Polite, MD  predniSONE (DELTASONE) 20 MG tablet Take 3 tablets (60 mg total) by mouth daily with breakfast. Take 3 tabs daily with breakfast for 5 days then change to 2 tabs daily for 5 days. 01/10/18   Truitt Merle, MD  spironolactone (ALDACTONE) 50 MG tablet Take 50 mg by mouth daily.    [provider]  triamcinolone cream (KENALOG) 0.1 % Apply 1 application topically as needed (rosacea).     [provider]  XIFAXAN 550 MG TABS tablet Take 1 tablet (550 mg total) by mouth 2 (two) times daily. 12/06/17   Truitt Merle, MD     Allergies:     Allergies  Allergen Reactions  . Oysters [Shellfish Allergy]   . Doxycycline Rash  . Hydroxyzine Hcl Rash     Physical Exam:   Vitals  Blood pressure (!) 153/71, pulse 64, temperature (!) 96 F (35.6 C), temperature source Rectal, resp. rate 13,  SpO2 98 %.   General: Early male lying in bed somnolent, minimally arousable but nonverbal HEENT: No pallor, no icterus, pupils reactive bilaterally, moist mucosa Chest: Diminished bibasilar breath sounds  CVS: Normal S1-S2, no murmurs GI: Soft, mild distention, nontender, bowel sounds present Musculoskeletal: Warm, 1+ pitting edema bilaterally CNS: Awake to command, nonverbal, not oriented   Data Review:    CBC Recent Labs  Lab 01/09/18 0810 01/13/18 1110 01/13/18 1118  WBC 6.3 9.1  --   HGB 14.0 14.0 10.9*  HCT 42.7 42.8 32.0*  PLT 144* 152  --   MCV 108.4* 110.3*  --   MCH 35.5* 36.1*  --   MCHC 32.8 32.7  --   RDW 16.2* 16.5*  --   LYMPHSABS 1.1 1.0  --   MONOABS 0.6 1.0  --   EOSABS 0.4 0.3  --   BASOSABS 0.1 0.0  --    ------------------------------------------------------------------------------------------------------------------  Chemistries  Recent Labs  Lab 01/09/18 0810 01/13/18 1110 01/13/18 1118  NA 142 140 146*  K 3.8 4.7 3.0*  CL 104 107 112*  CO2 32 28  --   GLUCOSE 150* 83 62*  BUN _0 CREATININE 0.92 0.63 0.40*  CALCIUM 8.4* 7.7*  --   AST 139* 148*  --   ALT 182* 169*  --   ALKPHOS 331* 236*  --   BILITOT 4.5* 3.3*  --    ------------------------------------------------------------------------------------------------------------------ estimated creatinine clearance is 73.6 mL/min (A) (by C-G formula based on SCr of 0.4 mg/dL (L)). ------------------------------------------------------------------------------------------------------------------ No results for input(s): TSH, T4TOTAL, T3FREE, THYROIDAB in the last 72 hours.  Invalid input(s): FREET3  Coagulation profile Recent Labs  Lab 01/13/18 1118  INR 1.41   ------------------------------------------------------------------------------------------------------------------- No results for input(s): DDIMER in the last 72  hours. -------------------------------------------------------------------------------------------------------------------  Cardiac Enzymes No results for input(s): CKMB, TROPONINI, MYOGLOBIN in the last 168 hours.  Invalid input(s): CK ------------------------------------------------------------------------------------------------------------------    Component Value Date/Time   BNP 713.9 (H) 09/16/2017 1558     ---------------------------------------------------------------------------------------------------------------  Urinalysis    Component Value Date/Time   COLORURINE AMBER (A) 01/13/2018 1030   APPEARANCEUR CLEAR 01/13/2018 1030   LABSPEC 1.018 01/13/2018 1030   PHURINE 7.0 01/13/2018 1030   GLUCOSEU NEGATIVE 01/13/2018 1030   HGBUR NEGATIVE 01/13/2018 1030   BILIRUBINUR NEGATIVE 01/13/2018 1030   KETONESUR NEGATIVE 01/13/2018 1030   PROTEINUR NEGATIVE 01/13/2018 1030   NITRITE NEGATIVE 01/13/2018 1030   LEUKOCYTESUR NEGATIVE 01/13/2018 1030    ----------------------------------------------------------------------------------------------------------------   Imaging Results:    Ct Head Wo Contrast  Result Date: 01/13/2018 CLINICAL DATA:  Altered mental status with decreased responsiveness today. History of cirrhosis and liver mass. EXAM: CT HEAD WITHOUT CONTRAST TECHNIQUE: Contiguous axial images were obtained from the base of the skull through the vertex without intravenous contrast. COMPARISON:  09/10/2017 FINDINGS: Brain: There is no evidence of acute infarct, intracranial hemorrhage, mass, midline shift, or extra-axial fluid collection. 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 Cerebral white matter hypodensities are similar to the prior study and nonspecific but compatible with mild chronic small vessel ischemic disease. There is mild cerebral atrophy. Vascular: Calcified atherosclerosis at the skull base. No hyperdense vessel.  Skull: No fracture or focal osseous lesion. Sinuses/Orbits: Left sphenoid sinus mucous retention cyst or polyp. Clear mastoid air cells. Bilateral cataract extraction. Other: None. IMPRESSION: 1. No evidence of acute intracranial abnormality. 2. Mild chronic small vessel ischemic disease. Electronically Signed   By: Logan Bores M.D.   On: 01/13/2018 11:52   Dg Chest Port 1 View  Result Date: 01/13/2018 CLINICAL DATA:  Altered mental status. EXAM: PORTABLE CHEST 1 VIEW COMPARISON:  Radiographs of October 16, 2017.  FINDINGS: Stable cardiomegaly is noted. Increased central pulmonary vascular congestion is noted with probable bilateral pulmonary edema. Mild bilateral pleural effusions are noted as well. No pneumothorax is noted. Bony thorax is unremarkable. IMPRESSION: Stable cardiomegaly with increased central pulmonary vascular congestion and probable bilateral pulmonary edema and pleural effusions. Electronically Signed   By: Marijo Conception, M.D.   On: 01/13/2018 12:13    My personal review of EKG: Not done  Assessment & Plan:   Acute toxic metabolic encephalopathy Possibly due to hepatic encephalopathy and generalized failure to thrive with progressive hepatocellular carcinoma. Wife reports that she does not want any further aggressive measures.  He has not been tolerating chemotherapy.  She would like to discuss goals of care with hospice and wants to take him home with home hospice (declined residential hospice option) Placed on observation overnight.  Mental status seems to have slightly improved (spontaneous eye opening) in the ED.  Hold all home medications.  Added low-dose PRN morphine for comfort.  Switch to low-dose IV morphine drip if in distress. Keep n.p.o.  Care management consulted for home hospice arrangement.  Possibly can be discharged home tomorrow once home hospice arranged.  Hepatocellular carcinoma stage IVb Progressive disease and unable to tolerate further therapy.   Discussed plan with his oncologist who agrees with hospice consult.  Held all home medications  Hypokalemia Goal for comfort.  Bilateral pleural effusion We will place him on daily IV Lasix for comfort.  Adult failure to thrive Comfort measures   DVT Prophylaxis: Comfort measures  AM Labs Ordered, also please review Full Orders  Family Communication: Admission, patients condition and plan of care including tests being ordered have been discussed with wife at bed  Code Status DNR, goal for comfort  Likely DC to home with hospice tomorrow  Condition GUARDED    Consults called: Oncology (Dr. Burr Medico)  Admission status: Observation  Time spent in minutes : 45   Nishant Dhungel M.D on 01/13/2018 at 2:39 PM  Between 7am to 7pm - Pager - [phone]. After 7pm go to [email or web address] - password [facility]  [facility] - Office  [phone]

## 2018-01-13 NOTE — ED Triage Notes (Signed)
EMS reports from home, wife called for AMS and less responsive today. Hx end stage liver cancer. DNR  BP 151/78 HR 68 RR 18 Sp02 97 RA CBG 92  18 LAC

## 2018-01-13 NOTE — Telephone Encounter (Signed)
Spoke with patient's wife and a friend (neighbor) Mr. Sherene Sires regarding patient's status, very ill, coughing, confused, shallow breathing.  They feel Hospice may be needed at this point however the patient's wife is very scared and concerned.  Consulted with Dr. Burr Medico instructed them to go to [facility] ED and she will come over to see him.  She verbalized an understanding and will proceed.

## 2018-01-13 NOTE — ED Notes (Signed)
Bed: WA15 Expected date:  Expected time:  Means of arrival:  Comments: ResB 

## 2018-01-13 NOTE — Progress Notes (Signed)
Devin Reeves   DOB:20-Jun-1940   FF#:[id number]   JTT#:[id number]  Oncology f/u   Subjective: Pt is well-known to me, has been under my care for his liver cancer lately.  He presented to the emergency room for worsening confusion, lethargy, generalized weakness and poor oral intake.  His wife called me this morning, and I referred him to [facility] emergency room.  It was lethargic, but arousable, does not answer questions, when I saw him in the ED.    Objective:  Vitals:   01/13/18 1800 01/13/18 1830  BP: (!) 146/65 (!) 166/72  Pulse: 70 77  Resp: 18 14  Temp:    SpO2: 95% 94%    There is no height or weight on file to calculate BMI.  Intake/Output Summary (Last 24 hours) at 01/13/2018 1940 Last data filed at 01/13/2018 1202 Gross per 24 hour  Intake 500 ml  Output -  Net 500 ml     Sclerae (+) jaundice   Oropharynx clear  No peripheral adenopathy  Lungs clear -- no rales or rhonchi  Heart regular rate and rhythm  Abdomen benign  MSK no focal spinal tenderness, no peripheral edema  Neuro, lethargic, able to open eyes, but did not questions or follow commands.    CBG (last 3)  No results for input(s): GLUCAP in the last 72 hours.   Labs:  Lab Results  Component Value Date   WBC 9.1 01/13/2018   HGB 10.9 (L) 01/13/2018   HCT 32.0 (L) 01/13/2018   MCV 110.3 (H) 01/13/2018   PLT 152 01/13/2018   NEUTROABS 6.8 01/13/2018   CMP Latest Ref Rng & Units 01/13/2018 01/13/2018 01/09/2018  Glucose 70 - 99 mg/dL 62(L) 83 150(H)  BUN 8 - 23 mg/dL 14 19 19   Creatinine 0.61 - 1.24 mg/dL 0.40(L) 0.63 0.92  Sodium 135 - 145 mmol/L 146(H) 140 142  Potassium 3.5 - 5.1 mmol/L 3.0(L) 4.7 3.8  Chloride 98 - 111 mmol/L 112(H) 107 104  CO2 22 - 32 mmol/L - 28 32  Calcium 8.9 - 10.3 mg/dL - 7.7(L) 8.4(L)  Total Protein 6.5 - 8.1 g/dL - 6.0(L) 6.7  Total Bilirubin 0.3 - 1.2 mg/dL - 3.3(H) 4.5(HH)  Alkaline Phos 38 - 126 U/L - 236(H) 331(H)  AST 15 - 41 U/L - 148(H) 139(H)  ALT 0 - 44  U/L - 169(H) 182(H)    Urine Studies No results for input(s): UHGB, CRYS in the last 72 hours.  Invalid input(s): UACOL, UAPR, USPG, UPH, UTP, UGL, UKET, UBIL, UNIT, UROB, ULEU, UEPI, UWBC, URBC, UBAC, CAST, [place], [place]  Basic Metabolic Panel: Recent Labs  Lab 01/09/18 0810 01/13/18 1110 01/13/18 1118  NA 142 140 146*  K 3.8 4.7 3.0*  CL 104 107 112*  CO2 32 28  --   GLUCOSE 150* 83 62*  BUN 19 19 14   CREATININE 0.92 0.63 0.40*  CALCIUM 8.4* 7.7*  --    GFR Estimated Creatinine Clearance: 73.6 mL/min (A) (by C-G formula based on SCr of 0.4 mg/dL (L)). Liver Function Tests: Recent Labs  Lab 01/09/18 0810 01/13/18 1110  AST 139* 148*  ALT 182* 169*  ALKPHOS 331* 236*  BILITOT 4.5* 3.3*  PROT 6.7 6.0*  ALBUMIN 1.8* 1.8*   Recent Labs  Lab 01/13/18 1110  LIPASE 47   Recent Labs  Lab 01/13/18 1110  AMMONIA 61*   Coagulation profile Recent Labs  Lab 01/13/18 1118  INR 1.41    CBC: Recent Labs  Lab 01/09/18 0810  01/13/18 1110 01/13/18 1118  WBC 6.3 9.1  --   NEUTROABS 4.0 6.8  --   HGB 14.0 14.0 10.9*  HCT 42.7 42.8 32.0*  MCV 108.4* 110.3*  --   PLT 144* 152  --    Cardiac Enzymes: No results for input(s): CKTOTAL, CKMB, CKMBINDEX, TROPONINI in the last 168 hours. BNP: Invalid input(s): POCBNP CBG: No results for input(s): GLUCAP in the last 168 hours. D-Dimer No results for input(s): DDIMER in the last 72 hours. Hgb A1c No results for input(s): HGBA1C in the last 72 hours. Lipid Profile No results for input(s): CHOL, HDL, LDLCALC, TRIG, CHOLHDL, LDLDIRECT in the last 72 hours. Thyroid function studies No results for input(s): TSH, T4TOTAL, T3FREE, THYROIDAB in the last 72 hours.  Invalid input(s): FREET3 Anemia work up No results for input(s): VITAMINB12, FOLATE, FERRITIN, TIBC, IRON, RETICCTPCT in the last 72 hours. Microbiology No results found for this or any previous visit (from the past 240 hour(s)).    Studies:  Ct Head Wo  Contrast  Result Date: 01/13/2018 CLINICAL DATA:  Altered mental status with decreased responsiveness today. History of cirrhosis and liver mass. EXAM: CT HEAD WITHOUT CONTRAST TECHNIQUE: Contiguous axial images were obtained from the base of the skull through the vertex without intravenous contrast. COMPARISON:  09/10/2017 FINDINGS: Brain: There is no evidence of acute infarct, intracranial hemorrhage, mass, midline shift, or extra-axial fluid collection. 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 Cerebral white matter hypodensities are similar to the prior study and nonspecific but compatible with mild chronic small vessel ischemic disease. There is mild cerebral atrophy. Vascular: Calcified atherosclerosis at the skull base. No hyperdense vessel. Skull: No fracture or focal osseous lesion. Sinuses/Orbits: Left sphenoid sinus mucous retention cyst or polyp. Clear mastoid air cells. Bilateral cataract extraction. Other: None. IMPRESSION: 1. No evidence of acute intracranial abnormality. 2. Mild chronic small vessel ischemic disease. Electronically Signed   By: Logan Bores M.D.   On: 01/13/2018 11:52   Dg Chest Port 1 View  Result Date: 01/13/2018 CLINICAL DATA:  Altered mental status. EXAM: PORTABLE CHEST 1 VIEW COMPARISON:  Radiographs of October 16, 2017. FINDINGS: Stable cardiomegaly is noted. Increased central pulmonary vascular congestion is noted with probable bilateral pulmonary edema. Mild bilateral pleural effusions are noted as well. No pneumothorax is noted. Bony thorax is unremarkable. IMPRESSION: Stable cardiomegaly with increased central pulmonary vascular congestion and probable bilateral pulmonary edema and pleural effusions. Electronically Signed   By: Marijo Conception, M.D.   On: 01/13/2018 12:13    Assessment: 77 y.o. with history of liver cirrhosis, secondary to alcohol, portal hypertension and metabolic encephalopathy, recently diagnosed  hepatocellular carcinoma with possible lung metastasis in August 2019, on the palliative chemotherapy, presented with progressive lethargy  1.  Acute hepatic encephalopathy 2.  Metastatic hepatocellular carcinoma, on atezolizumab and bevacizumab, recently held due to worsening liver function 3.  Hyperbilirubinemia, likely secondary to #2 vs immunotherapy related hepatitis 4.  Bilateral pleural effusion and leg edema  5. Liver cirrhosis secondary to alcohol, portal hypertension and history of metabolic encephalopathy   Plan:  -He recently developed worsening hyperbilirubinemia, outpatient work-up showed no biliary obstruction, this is likely related to his progression of liver cancer, also autoimmune hepatitis from immunotherapy is also a possibility.  I did start him on high-dose prednisone last week, due to the progressive encephalopathy, I will stop prednisone for now -Due to his advanced age, medical comorbidities, incurable malignancy, his overall prognosis is very poor.  I have discussed hospice with patient's wife last week, and again this morning, and she is in agreement. Please consult palliative care and hospice  -he would benefit from lactulose, his ammonia level on admission was elevated, I will order it.  -If his overall condition does not improve, he would be a candidate for inpt hospice  -I will f/u tomorrow   Truitt Merle, MD 01/13/2018  7:40 PM

## 2018-01-13 NOTE — ED Notes (Signed)
Bed: RESB Expected date:  Expected time:  Means of arrival:  Comments: EMS AMS liver cancer

## 2018-01-13 NOTE — ED Notes (Signed)
ED TO INPATIENT HANDOFF REPORT  Name/Age/Gender Devin Reeves 77 y.o. male  Code Status    Code Status Orders  (From admission, onward)         Start     Ordered   01/13/18 1045  Do not attempt resuscitation/DNR  Continuous    Question Answer Comment  In the event of cardiac or respiratory ARREST Do not call a "code blue"   In the event of cardiac or respiratory ARREST Do not perform Intubation, CPR, defibrillation or ACLS   In the event of cardiac or respiratory ARREST Use medication by any route, position, wound care, and other measures to relive pain and suffering. May use oxygen, suction and manual treatment of airway obstruction as needed for comfort.      01/13/18 1044        Code Status History    Date Active Date Inactive Code Status Order ID Comments User Context   11/23/2017 2157 11/27/2017 1801 Full Code [id number]  Phillips Grout, MD Inpatient   09/16/2017 1826 09/22/2017 1846 Full Code [id number]  Donne Hazel, MD ED    Advance Directive Documentation     Most Recent Value  Type of Advance Directive  Out of facility DNR (pink MOST or yellow form)  Pre-existing out of facility DNR order (yellow form or pink MOST form)  Yellow form placed in chart (order not valid for inpatient use)  "MOST" Form in Place?  -      Home/SNF/Other Home  Chief Complaint ams  Level of Care/Admitting Diagnosis ED Disposition    ED Disposition Condition Devin: Reeves [100102]  Level of Care: Med-Surg [16]  Diagnosis: FTT (failure to thrive) in adult [782423]  Admitting Physician: Louellen Molder [phone]  Attending Physician: Louellen Molder [[id number]]  PT Class (Do Not Modify): Observation [104]  PT Acc Code (Do Not Modify): Observation [10022]       Medical History Past Medical History:  Diagnosis Date  . Acne rosacea   . Hyperlipidemia   . Hypertension   . Liver cirrhosis, alcoholic ([place])   . Nocturia   . Skin  cancer     Allergies Allergies  Allergen Reactions  . Oysters [Shellfish Allergy]   . Doxycycline Rash  . Hydroxyzine Hcl Rash    IV Location/Drains/Wounds Patient Lines/Drains/Airways Status   Active Line/Drains/Airways    Name:   Placement date:   Placement time:   Site:   Days:   Peripheral IV 01/13/18 Left Antecubital   01/13/18    1024    Antecubital   less than 1          Labs/Imaging Results for orders placed or performed during the hospital encounter of 01/13/18 (from the past 48 hour(s))  Urinalysis, Routine w reflex microscopic     Status: Abnormal   Collection Time: 01/13/18 10:30 AM  Result Value Ref Range   Color, Urine AMBER (A) YELLOW    Comment: BIOCHEMICALS MAY BE AFFECTED BY COLOR   APPearance CLEAR CLEAR   Specific Gravity, Urine 1.018 1.005 - 1.030   pH 7.0 5.0 - 8.0   Glucose, UA NEGATIVE NEGATIVE mg/dL   Hgb urine dipstick NEGATIVE NEGATIVE   Bilirubin Urine NEGATIVE NEGATIVE   Ketones, ur NEGATIVE NEGATIVE mg/dL   Protein, ur NEGATIVE NEGATIVE mg/dL   Nitrite NEGATIVE NEGATIVE   Leukocytes, UA NEGATIVE NEGATIVE    Comment: Performed at [facility], [place] [address] [place], [place]  [address]  CBC with Differential/Platelet     Status: Abnormal   Collection Time: 01/13/18 11:10 AM  Result Value Ref Range   WBC 9.1 4.0 - 10.5 K/uL   RBC 3.88 (L) 4.22 - 5.81 MIL/uL   Hemoglobin 14.0 13.0 - 17.0 g/dL   HCT 42.8 39.0 - 52.0 %   MCV 110.3 (H) 80.0 - 100.0 fL   MCH 36.1 (H) 26.0 - 34.0 pg   MCHC 32.7 30.0 - 36.0 g/dL   RDW 16.5 (H) 11.5 - 15.5 %   Platelets 152 150 - 400 K/uL   nRBC 0.0 0.0 - 0.2 %   Neutrophils Relative % 74 %   Neutro Abs 6.8 1.7 - 7.7 K/uL   Lymphocytes Relative 12 %   Lymphs Abs 1.0 0.7 - 4.0 K/uL   Monocytes Relative 11 %   Monocytes Absolute 1.0 0.1 - 1.0 K/uL   Eosinophils Relative 3 %   Eosinophils Absolute 0.3 0.0 - 0.5 K/uL   Basophils Relative 0 %   Basophils Absolute 0.0 0.0 - 0.1 K/uL    Immature Granulocytes 0 %   Abs Immature Granulocytes 0.03 0.00 - 0.07 K/uL    Comment: Performed at [facility], [place] [address], [place], [place] [address]  Comprehensive metabolic panel     Status: Abnormal   Collection Time: 01/13/18 11:10 AM  Result Value Ref Range   Sodium 140 135 - 145 mmol/L   Potassium 4.7 3.5 - 5.1 mmol/L   Chloride 107 98 - 111 mmol/L   CO2 28 22 - 32 mmol/L   Glucose, Bld 83 70 - 99 mg/dL   BUN 19 8 - 23 mg/dL   Creatinine, Ser 0.63 0.61 - 1.24 mg/dL   Calcium 7.7 (L) 8.9 - 10.3 mg/dL   Total Protein 6.0 (L) 6.5 - 8.1 g/dL   Albumin 1.8 (L) 3.5 - 5.0 g/dL   AST 148 (H) 15 - 41 U/L   ALT 169 (H) 0 - 44 U/L   Alkaline Phosphatase 236 (H) 38 - 126 U/L   Total Bilirubin 3.3 (H) 0.3 - 1.2 mg/dL   GFR calc non Af Amer >60 >60 mL/min   GFR calc Af Amer >60 >60 mL/min   Anion gap 5 5 - 15    Comment: Performed at [facility], [place] [address], [place], [place] [address]  Lipase, blood     Status: None   Collection Time: 01/13/18 11:10 AM  Result Value Ref Range   Lipase 47 11 - 51 U/L    Comment: Performed at [facility], [place] [address] [place], [place] [address]  Ammonia     Status: Abnormal   Collection Time: 01/13/18 11:10 AM  Result Value Ref Range   Ammonia 61 (H) 9 - 35 umol/L    Comment: Performed at [facility], [place] [address] [place], [place] [address]  I-stat Chem 8, ED     Status: Abnormal   Collection Time: 01/13/18 11:18 AM  Result Value Ref Range   Sodium 146 (H) 135 - 145 mmol/L   Potassium 3.0 (L) 3.5 - 5.1 mmol/L   Chloride 112 (H) 98 - 111 mmol/L   BUN 14 8 - 23 mg/dL   Creatinine, Ser 0.40 (L) 0.61 - 1.24 mg/dL   Glucose, Bld 62 (L) 70 - 99 mg/dL   Calcium, Ion 0.87 (LL) 1.15 - 1.40 mmol/L   TCO2 21 (L) 22 - 32 mmol/L   Hemoglobin 10.9 (  L) 13.0 - 17.0 g/dL   HCT 32.0 (L) 39.0 - 52.0 %   Comment NOTIFIED PHYSICIAN   Protime-INR     Status: Abnormal    Collection Time: 01/13/18 11:18 AM  Result Value Ref Range   Prothrombin Time 17.1 (H) 11.4 - 15.2 seconds   INR 1.41     Comment: Performed at [facility], [place] [address], [place], [place] [address]   Ct Head Wo Contrast  Result Date: 01/13/2018 CLINICAL DATA:  Altered mental status with decreased responsiveness today. History of cirrhosis and liver mass. EXAM: CT HEAD WITHOUT CONTRAST TECHNIQUE: Contiguous axial images were obtained from the base of the skull through the vertex without intravenous contrast. COMPARISON:  09/10/2017 FINDINGS: Brain: There is no evidence of acute infarct, intracranial hemorrhage, mass, midline shift, or extra-axial fluid collection. 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 Cerebral white matter hypodensities are similar to the prior study and nonspecific but compatible with mild chronic small vessel ischemic disease. There is mild cerebral atrophy. Vascular: Calcified atherosclerosis at the skull base. No hyperdense vessel. Skull: No fracture or focal osseous lesion. Sinuses/Orbits: Left sphenoid sinus mucous retention cyst or polyp. Clear mastoid air cells. Bilateral cataract extraction. Other: None. IMPRESSION: 1. No evidence of acute intracranial abnormality. 2. Mild chronic small vessel ischemic disease. Electronically Signed   By: Logan Bores M.D.   On: 01/13/2018 11:52   Dg Chest Port 1 View  Result Date: 01/13/2018 CLINICAL DATA:  Altered mental status. EXAM: PORTABLE CHEST 1 VIEW COMPARISON:  Radiographs of October 16, 2017. FINDINGS: Stable cardiomegaly is noted. Increased central pulmonary vascular congestion is noted with probable bilateral pulmonary edema. Mild bilateral pleural effusions are noted as well. No pneumothorax is noted. Bony thorax is unremarkable. IMPRESSION: Stable cardiomegaly with increased central pulmonary vascular congestion and probable bilateral pulmonary edema and pleural  effusions. Electronically Signed   By: Marijo Conception, M.D.   On: 01/13/2018 12:13   None  Pending Labs Unresulted Labs (From admission, onward)   None      Vitals/Pain Today's Vitals   01/13/18 1730 01/13/18 1800 01/13/18 1830 01/13/18 2000  BP: (!) 144/66 (!) 146/65 (!) 166/72 (!) 146/93  Pulse: 64 70 77 82  Resp: 10 18 14 18   Temp:      TempSrc:      SpO2: 94% 95% 94% 94%  PainSc:        Isolation Precautions No active isolations  Medications Medications  dextrose 5 %-0.9 % sodium chloride infusion ( Intravenous Transfusing/Transfer 01/13/18 2017)  lactulose (CHRONULAC) enema 200 gm (has no administration in time range)  sodium chloride 0.9 % bolus 500 mL (0 mLs Intravenous Stopped 01/13/18 1202)  dextrose 50 % solution 50 mL (50 mLs Intravenous Given 01/13/18 1159)    Mobility non-ambulatory

## 2018-01-14 DIAGNOSIS — I10 Essential (primary) hypertension: Secondary | ICD-10-CM | POA: Diagnosis present

## 2018-01-14 DIAGNOSIS — R627 Adult failure to thrive: Secondary | ICD-10-CM | POA: Diagnosis present

## 2018-01-14 DIAGNOSIS — Z85828 Personal history of other malignant neoplasm of skin: Secondary | ICD-10-CM | POA: Diagnosis not present

## 2018-01-14 DIAGNOSIS — Z91013 Allergy to seafood: Secondary | ICD-10-CM | POA: Diagnosis not present

## 2018-01-14 DIAGNOSIS — Z7952 Long term (current) use of systemic steroids: Secondary | ICD-10-CM | POA: Diagnosis not present

## 2018-01-14 DIAGNOSIS — R4182 Altered mental status, unspecified: Secondary | ICD-10-CM | POA: Diagnosis present

## 2018-01-14 DIAGNOSIS — K7040 Alcoholic hepatic failure without coma: Secondary | ICD-10-CM | POA: Diagnosis present

## 2018-01-14 DIAGNOSIS — E43 Unspecified severe protein-calorie malnutrition: Secondary | ICD-10-CM | POA: Diagnosis present

## 2018-01-14 DIAGNOSIS — Z9221 Personal history of antineoplastic chemotherapy: Secondary | ICD-10-CM | POA: Diagnosis not present

## 2018-01-14 DIAGNOSIS — G9341 Metabolic encephalopathy: Secondary | ICD-10-CM

## 2018-01-14 DIAGNOSIS — Z515 Encounter for palliative care: Secondary | ICD-10-CM | POA: Diagnosis present

## 2018-01-14 DIAGNOSIS — K7030 Alcoholic cirrhosis of liver without ascites: Secondary | ICD-10-CM | POA: Diagnosis present

## 2018-01-14 DIAGNOSIS — J90 Pleural effusion, not elsewhere classified: Secondary | ICD-10-CM | POA: Diagnosis present

## 2018-01-14 DIAGNOSIS — E162 Hypoglycemia, unspecified: Secondary | ICD-10-CM | POA: Diagnosis present

## 2018-01-14 DIAGNOSIS — Z66 Do not resuscitate: Secondary | ICD-10-CM | POA: Diagnosis present

## 2018-01-14 DIAGNOSIS — C220 Liver cell carcinoma: Secondary | ICD-10-CM | POA: Diagnosis present

## 2018-01-14 DIAGNOSIS — E876 Hypokalemia: Secondary | ICD-10-CM | POA: Diagnosis present

## 2018-01-14 DIAGNOSIS — Z87891 Personal history of nicotine dependence: Secondary | ICD-10-CM | POA: Diagnosis not present

## 2018-01-14 DIAGNOSIS — E785 Hyperlipidemia, unspecified: Secondary | ICD-10-CM | POA: Diagnosis present

## 2018-01-14 DIAGNOSIS — Z7189 Other specified counseling: Secondary | ICD-10-CM | POA: Diagnosis not present

## 2018-01-14 DIAGNOSIS — Z79899 Other long term (current) drug therapy: Secondary | ICD-10-CM | POA: Diagnosis not present

## 2018-01-14 DIAGNOSIS — K766 Portal hypertension: Secondary | ICD-10-CM | POA: Diagnosis present

## 2018-01-14 DIAGNOSIS — E860 Dehydration: Secondary | ICD-10-CM | POA: Diagnosis present

## 2018-01-14 DIAGNOSIS — Z6825 Body mass index (BMI) 25.0-25.9, adult: Secondary | ICD-10-CM | POA: Diagnosis not present

## 2018-01-14 MED ORDER — MORPHINE SULFATE (PF) 2 MG/ML IV SOLN: 1.0000 mg | INTRAVENOUS | Status: DC | PRN

## 2018-01-14 MED ORDER — MORPHINE SULFATE (CONCENTRATE) 10 MG/0.5ML PO SOLN: 2.5000 mg | ORAL | Status: DC | PRN

## 2018-01-14 NOTE — Care Management Obs Status (Signed)
[facility] NOTIFICATION   Patient Details  Name: Devin Reeves MRN: [id number] Date of Birth: 13-Feb-1940   Medicare Observation Status Notification Given:  Yes    [name], RN 01/14/2018, 4:03 PM

## 2018-01-14 NOTE — Progress Notes (Addendum)
[facility]  Received request from [facility] for family interest in hospice/[facility]. Chart reviewed and eligibility for [facility] confirmed. Paper work completed with spouse Charleston Ropes and daughter April at bedside. Plan is for patient to transfer to [facility] tomorrow 01/15/18. Spouse wants to be present before PTAR is called.   RN please call report to [phone].  Please send discharge summary to [phone].  Thank you,  Erling Conte, LCSW [phone]

## 2018-01-14 NOTE — Progress Notes (Signed)
PROGRESS NOTE  Devin Reeves JJH:[id number] DOB: 15-Nov-1940 DOA: 01/13/2018 PCP: Default, Provider, MD  HPI/Recap of past 24 hours: HPI from Dr Greer Ee  is a 77 y.o. male, with hepatocellular carcinoma diagnosed in August 2019, in September he was started on atezolizumab and bevacizumab.  He appeared to tolerate treatment well initially.  Follow-up imaging 1 month back showed progressive hepatic tumor extension into the hepatic veins and IVC. He was seen by his oncologist 4 days back and his last treatment was held due to worsened LFTs. Patient over the past few weeks also showing cognitive decline with overall failure to thrive, pleural effusion and bilateral leg edema along with encephalopathy. Patient brought to the ED due to progressive decline in his function, confused and poorly responsive.  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 In the ED he was somnolent and poorly arousable. Labs showed hemoglobin dropped to 10.9 from baseline around 14, normal platelets.  Chemistry showed albumin of 1.8, transaminitis with AST of 148, ALT of 169, ALK of 236 and total bilirubin of 3.3 (not worsened from last lab). Head CT negative for acute findings.  Chest x-ray showed pulmonary vascular congestion and bilateral pulmonary edema with pleural effusions. Dr. Burr Medico consulted who saw pt in ED and recommended hospice. Pt admitted for further management.    Today, spoke to daughter and wife at length, they want to speak to palliative and hospice team for more information. All questions answered. Wife leaning towards home hospice. Pt is very lethargic, easily arousable when name called. Not oriented. Had 2 large BM overnight. Pt noted to have LUE tremors which is new.  Assessment/Plan: Active Problems:   Toxic encephalopathy  Protein-calorie malnutrition, severe   Hepatocellular carcinoma (HCC)   Goals of care, counseling/discussion   Failure to thrive in adult   Hypoglycemia   Acute metabolic encephalopathy  Acute metabolic encephalopathy Very lethargic, not oriented, but easily arousable (not at baseline) Possibly due to hepatic encephalopathy and generalized failure to thrive with progressive hepatocellular carcinoma Afebrile, no leukocytosis CT head negative Palliative/hospice team consulted as family is interested in home hospice  Hepatocellular carcinoma/Liver cirrhosis Progressive disease and unable to tolerate further therapy Oncologist on board: Plan for hospice  Hypokalemia Replace prn  Bilateral pleural effusion/BLE edema/pulm vasc congestion ECHO EF 50-55% Daily IV Lasix for comfort.  Adult failure to thrive Comfort measures  [facility] Palliative consulted       Malnutrition Type:      Malnutrition Characteristics:      Nutrition Interventions:       Estimated body mass index is 25.13 kg/m as calculated from the following:   Height as of 01/09/18: 5' 8"  (1.727 m).   Weight as of 01/09/18: 75 kg.     Code Status: DNR   Family Communication: Spoke with wife and daughter  Disposition Plan: Likely home hospice   Consultants:  Palliative/hospice team  Procedures:  None   Antimicrobials:  None  DVT prophylaxis: SCDs   Objective: Vitals:   01/13/18 2000 01/13/18 2101 01/14/18 0505 01/14/18 0918  BP: (!) 146/93 (!) 156/78 125/60   Pulse: 82 77 77   Resp: 18 16 16    Temp:  (!) 97.3 F (36.3 C) 98 F (36.7 C) 98.1 F (36.7 C)  TempSrc:  Oral Oral Axillary  SpO2: 94% 95% 94%  Intake/Output Summary (Last 24 hours) at 01/14/2018 1249 Last data filed at 01/14/2018 0500 Gross per 24 hour  Intake 300 ml  Output 251 ml  Net 49 ml   There were no vitals filed for this visit.  Exam:   General: Very lethargic, not oriented, but easily  arousable   Cardiovascular: S1, S2 present  Respiratory: Poor respiratory effort   Abdomen: Soft, NT, ND, BS present  Musculoskeletal: 2+ BLE edema  Skin: Multiple bruising/ecchymosis  Psychiatry: Unable to assess   Data Reviewed: CBC: Recent Labs  Lab 01/09/18 0810 01/13/18 1110 01/13/18 1118  WBC 6.3 9.1  --   NEUTROABS 4.0 6.8  --   HGB 14.0 14.0 10.9*  HCT 42.7 42.8 32.0*  MCV 108.4* 110.3*  --   PLT 144* 152  --    Basic Metabolic Panel: Recent Labs  Lab 01/09/18 0810 01/13/18 1110 01/13/18 1118  NA 142 140 146*  K 3.8 4.7 3.0*  CL 104 107 112*  CO2 32 28  --   GLUCOSE 150* 83 62*  BUN 19 19 14   CREATININE 0.92 0.63 0.40*  CALCIUM 8.4* 7.7*  --    GFR: Estimated Creatinine Clearance: 73.6 mL/min (A) (by C-G formula based on SCr of 0.4 mg/dL (L)). Liver Function Tests: Recent Labs  Lab 01/09/18 0810 01/13/18 1110  AST 139* 148*  ALT 182* 169*  ALKPHOS 331* 236*  BILITOT 4.5* 3.3*  PROT 6.7 6.0*  ALBUMIN 1.8* 1.8*   Recent Labs  Lab 01/13/18 1110  LIPASE 47   Recent Labs  Lab 01/13/18 1110  AMMONIA 61*   Coagulation Profile: Recent Labs  Lab 01/13/18 1118  INR 1.41   Cardiac Enzymes: No results for input(s): CKTOTAL, CKMB, CKMBINDEX, TROPONINI in the last 168 hours. BNP (last 3 results) No results for input(s): PROBNP in the last 8760 hours. HbA1C: No results for input(s): HGBA1C in the last 72 hours. CBG: No results for input(s): GLUCAP in the last 168 hours. Lipid Profile: No results for input(s): CHOL, HDL, LDLCALC, TRIG, CHOLHDL, LDLDIRECT in the last 72 hours. Thyroid Function Tests: No results for input(s): TSH, T4TOTAL, FREET4, T3FREE, THYROIDAB in the last 72 hours. Anemia Panel: No results for input(s): VITAMINB12, FOLATE, FERRITIN, TIBC, IRON, RETICCTPCT in the last 72 hours. Urine analysis:    Component Value Date/Time   COLORURINE AMBER (A) 01/13/2018 1030   APPEARANCEUR CLEAR 01/13/2018 1030   LABSPEC 1.018  01/13/2018 1030   PHURINE 7.0 01/13/2018 1030   GLUCOSEU NEGATIVE 01/13/2018 1030   HGBUR NEGATIVE 01/13/2018 1030   BILIRUBINUR NEGATIVE 01/13/2018 1030   KETONESUR NEGATIVE 01/13/2018 1030   PROTEINUR NEGATIVE 01/13/2018 1030   NITRITE NEGATIVE 01/13/2018 1030   LEUKOCYTESUR NEGATIVE 01/13/2018 1030   Sepsis Labs: @LABRCNTIP (procalcitonin:4,lacticidven:4)  )No results found for this or any previous visit (from the past 240 hour(s)).    Studies: No results found.  Scheduled Meds: . furosemide  40 mg Intravenous Daily  . lactulose  300 mL Rectal Once    Continuous Infusions: . dextrose 5 % and 0.9% NaCl 75 mL/hr at 01/14/18 1150     LOS: 0 days     Alma Friendly, MD [facility]  If 7PM-7AM, please contact night-coverage [email or web address] 01/14/2018, 12:49 PM

## 2018-01-14 NOTE — Progress Notes (Signed)
Nutrition Brief Note  Patient identified via MST score.  Chart reviewed.  Pt now on comfort measures. No nutrition interventions warranted at this time.    Clayton Bibles, MS, RD, [place] Dietitian Pager: [phone] After Hours Pager: [phone]

## 2018-01-14 NOTE — Care Management Note (Signed)
Case Management Note  Patient Details  Name: Devin Reeves MRN: [id number] Date of Birth: May 08, 1940  Subjective/Objective:                  DISCHARGE PLANNING  Action/Plan: tct-jennifer [facility] and [facility] [place]/message left phone for referral/wife has chosen this agency for hospice home care.  Expected Discharge Date:  (unknown)               Expected Discharge Plan:  Home w Hospice Care  In-House Referral:     Discharge planning Services  CM Consult  Post Acute Care Choice:    Choice offered to:  Spouse  DME Arranged:    DME Agency:     HH Arranged:  RN [place] Agency:  [facility] [place]  Status of Service:     If discussed at [employer] of Stay Meetings, dates discussed:    Additional Comments:  [name], RN 01/14/2018, 1:06 PM

## 2018-01-14 NOTE — Progress Notes (Signed)
Unsuccessfully attempted to administer lactulose by rectal balloon cath enema. Was only able to administer 140 mL of the saline/lactouse mix. Patient had two large formed stools while pushing the enema out. Attempted twice. Patient was not able to retain med.  Will continue to monitor patient.

## 2018-01-14 NOTE — Consult Note (Signed)
Consultation Note Date: 01/14/2018   Patient Name: Devin Reeves  DOB: 1940/12/02  MRN: [id number]  Age / Sex: 77 y.o., male  PCP: Default, Provider, Reeves Referring Physician: Alma Friendly, Reeves  Reason for Consultation: Establishing goals of care and Hospice Evaluation  HPI/Patient Profile: 77 y.o. male  with past medical history of hepatocellular cancer (diagnosed Aug 2019), liver cirrhosis, HTN, HLD admitted on 01/13/2018 with altered mental status with liver failure associated with progression of hepatocellular carcinoma.   Clinical Assessment and Goals of Care: I met today at Devin Reeves bedside with wife, Devin Reeves, and daughter, Devin Reeves +1 Reeves. Devin Reeves is very tearful even when I enter the room. Devin Reeves was updating me on her father's progression and that he was doing fairly well and functioning (even washing dishes at home) up until ~1 week ago. He has quickly declined over the past few days and was even requesting to prepare his funeral arrangements. Dr. Burr Reeves came and updated family on his progression at [facility].    We further discussed his current state and options to move forward with hospice and comfort care. Devin Reeves discusses taking him home but she is so exhausted and emotional I encouraged her to consider hospice facility. Devin Reeves reinforces that they cannot care for Devin Reeves at home even the 2 of them as they were struggling before his decline. I encouraged Devin Reeves that hospice facilities are nothing like skilled nursing and described our local hospice facility, Devin Reeves became much more at ease. After having a clearer understanding of Devin Reeves feels much better and wishes to move forward with placement at Devin Reeves as soon as possible. They plan to bring their dog to visit there as well.   I spent time providing support and answering questions. I notified Devin Reeves and  hospice liaison for Devin Reeves.    Primary Decision Maker NEXT OF KIN wife Devin Reeves (with support from daughter Devin Reeves)    SUMMARY OF RECOMMENDATIONS   - Transition to Devin Reeves for comfort care at [place]:  Devin Reeves   Symptom Management:   PRN medications ordered for comfort.   Comfort care. IVF to d/c upon transfer to hospice.   Palliative Prophylaxis:   Aspiration, Bowel Regimen and Delirium Protocol  Additional Recommendations (Limitations, Scope, Preferences):  Full Comfort Care  Psycho-social/Spiritual:   Desire for further Chaplaincy support:yes  Additional Recommendations: Grief/Bereavement Support  Prognosis:   < 2 weeks  Discharge Planning: Hospice facility      Primary Diagnoses: Present on Admission: . Hepatocellular carcinoma ([place]) . Protein-calorie malnutrition, severe . Toxic encephalopathy . Hypoglycemia . Acute metabolic encephalopathy   I have reviewed the medical record, interviewed the patient and family, and examined the patient. The following aspects are pertinent.  Past Medical History:  Diagnosis Date  . Acne rosacea   . Hyperlipidemia   . Hypertension   . Liver cirrhosis, alcoholic ([place])   . Nocturia   . Skin cancer    Social History   Socioeconomic History  .  Marital status: Married    Spouse name: Not on file  . Number of children: Not on file  . Years of education: Not on file  . Highest education level: Not on file  Occupational History  . Not on file  Social Needs  . Financial resource strain: Not on file  . Food insecurity:    Worry: Not on file    Inability: Not on file  . Transportation needs:    Medical: Not on file    Non-medical: Not on file  Tobacco Use  . Smoking status: Former Smoker    Packs/day: 2.00    Years: 20.00    Pack years: 40.00  . Smokeless tobacco: Never Used  . Tobacco comment: quit in 1999  Substance and Sexual Activity  . Alcohol use: Yes     Alcohol/week: 14.0 standard drinks    Types: 14 Glasses of wine per week    Comment: quit completely in 06/2017  . Drug use: No  . Sexual activity: Not on file  Lifestyle  . Physical activity:    Days per week: Not on file    Minutes per session: Not on file  . Stress: Not on file  Relationships  . Social connections:    Talks on phone: Not on file    Gets together: Not on file    Attends religious service: Not on file    Active member of club or organization: Not on file    Attends meetings of clubs or organizations: Not on file    Relationship status: Not on file  Other Topics Concern  . Not on file  Social History Narrative  . Not on file   Family History  Problem Relation Age of Onset  . Cancer Maternal Grandfather        bone cancer   Scheduled Meds: . furosemide  40 mg Intravenous Daily  . lactulose  300 mL Rectal Once   Continuous Infusions: . dextrose 5 % and 0.9% NaCl 50 mL/hr at 01/14/18 1334   PRN Meds:.acetaminophen **OR** acetaminophen, morphine injection, ondansetron **OR** ondansetron (ZOFRAN) IV Allergies  Allergen Reactions  . Oysters [Shellfish Allergy]   . Doxycycline Rash  . Hydroxyzine Hcl Rash   Review of Systems  Unable to perform ROS: Acuity of condition    Physical Exam Vitals signs and nursing note reviewed.  Cardiovascular:     Rate and Rhythm: Normal rate.  Pulmonary:     Effort: Pulmonary effort is normal. No tachypnea, accessory muscle usage or respiratory distress.  Abdominal:     General: Abdomen is flat.  Neurological:     Mental Status: He is lethargic and confused.     Vital Signs: BP (!) 159/75   Pulse 70   Temp 98.1 F (36.7 C) (Axillary)   Resp 19   SpO2 97%  Pain Scale: 0-10   Pain Score: 0-No pain   SpO2: SpO2: 97 % O2 Device:SpO2: 97 % O2 Flow Rate: .   IO: Intake/output summary:   Intake/Output Summary (Last 24 hours) at 01/14/2018 1618 Last data filed at 01/14/2018 1500 Gross per 24 hour  Intake  1083 ml  Output 2351 ml  Net -1268 ml    LBM: Last BM Date: 01/14/18 Baseline Weight:   Most recent weight:       Palliative Assessment/Data:     Time In: 1530 Time Out: 1640 Time Total: 70 min Greater than 50%  of this time was spent counseling and coordinating care related to the  above assessment and plan.  Signed by: Vinie Sill, NP Palliative Medicine Team Pager # [phone] (M-F 8a-5p) Team Phone # [phone] (Nights/Weekends)

## 2018-01-15 MED ORDER — MORPHINE SULFATE (CONCENTRATE) 10 MG/0.5ML PO SOLN: 2.5000 mg | ORAL | Status: AC | PRN

## 2018-01-15 NOTE — Progress Notes (Signed)
   01/15/18 1105  Clinical Encounter Type  Visited With Patient and family together  Visit Type Initial  Referral From Nurse  Consult/Referral To [place]  The chaplain responded to RN page for Pt. prayer before he transitions  to [facility].  The Pt. at the time of the visit was accompanied by his wife and daughter.  The Pt. exchanged conversation with the chaplain expressing his acceptance of God's love and his prayer requests for strength and peace during the transition.  The family and dog-Sweet Pea are hoping to have quality time with the Pt. at [facility].  The family and RN thanked the chaplain for the spiritual care.

## 2018-01-15 NOTE — Progress Notes (Signed)
Patient to transfer to [facility].   Family notified of transfer.   LCSW faxed dc docs to facility.   RN please call report to [place], [place]

## 2018-01-15 NOTE — Progress Notes (Signed)
Report given to Colletta [place], Therapist, sports at [facility]. Patient will be transferred via [place].

## 2018-01-15 NOTE — Discharge Summary (Addendum)
Physician Discharge Summary  Devin Reeves ENI:[id number] DOB: 29-Oct-1940 DOA: 01/13/2018  PCP: Default, Provider, MD  Admit date: 01/13/2018 Discharge date: 01/15/2018  Admitted From: Home Disposition:  Residential Hospice facility  Recommendations for Outpatient Follow-up:  1. Patient will go to residential hospice facility.  Home Health:No Equipment/Devices:none  Discharge Condition:stable CODE STATUS:full Diet recommendation: Heart Healthy  Brief/Interim Summary: 77 year old with past medical history of hepatocellular carcinoma diagnosed in August 2019, started on chemotherapy follow-up imaging showed progressive disease with tumor extending into hepatic veins and IVC he was seen by his oncologist during this time and chemo was held due to worsening LFTs.  Over the last several weeks prior to admission he has had progressive cognitive decline with pleural effusion lower extremity edema and encephalopathy.  Brought into the ED ED on the day of admission due to progressive confusion.  Was started on oral prednisone by his oncologist 2 days prior to admission  Discharge Diagnoses:  Acute metabolic encephalopathy due to hepatocellular carcinoma: Possibly due to hepatic encephalopathy and failure to drive in the setting of decreased oral intake and dehydration. Palliative care team was consulted and met with family and they decided to move towards comfort care. They relate the only one medication to make him comfortable and comfort feeds.  Hepatocellular carcinoma: No further chemotherapy patient is going to [facility].  Hypokalemia: It was repleted orally.  Bilateral pleural effusion/bilateral lower extremity edema: The third spacing due to hypoalbuminemia. He was given IV Lasix with some improvement. We will discontinue now that he is being transferred to [facility].  Protein-calorie malnutrition, severe   Discharge Instructions  Discharge Instructions    Diet - low  sodium heart healthy   Complete by:  As directed    Increase activity slowly   Complete by:  As directed      Allergies as of 01/15/2018      Reactions   Oysters [shellfish Allergy]    Doxycycline Rash   Hydroxyzine Hcl Rash      Medication List    STOP taking these medications   acetaminophen 500 MG tablet Commonly known as:  TYLENOL   feeding supplement (ENSURE ENLIVE) Liqd   furosemide 40 MG tablet Commonly known as:  LASIX   Magnesium 250 MG Tabs   omeprazole 40 MG capsule Commonly known as:  PRILOSEC   potassium chloride SA 20 MEQ tablet Commonly known as:  K-DUR,KLOR-CON   predniSONE 20 MG tablet Commonly known as:  DELTASONE   spironolactone 25 MG tablet Commonly known as:  ALDACTONE   triamcinolone cream 0.1 % Commonly known as:  KENALOG   vitamin C 100 MG tablet   XIFAXAN 550 MG Tabs tablet Generic drug:  rifaximin     TAKE these medications   morphine CONCENTRATE 10 MG/0.5ML Soln concentrated solution Place 0.13 mLs (2.6 mg total) under the tongue every 3 (three) hours as needed for severe pain or shortness of breath.       Allergies  Allergen Reactions  . Oysters [Shellfish Allergy]   . Doxycycline Rash  . Hydroxyzine Hcl Rash    Consultations:  Oncology  PMT   Procedures/Studies: Ct Head Wo Contrast  Result Date: 01/13/2018 CLINICAL DATA:  Altered mental status with decreased responsiveness today. History of cirrhosis and liver mass. EXAM: CT HEAD WITHOUT CONTRAST TECHNIQUE: Contiguous axial images were obtained from the base of the skull through the vertex without intravenous contrast. COMPARISON:  09/10/2017 FINDINGS: Brain: There is no evidence of acute infarct, intracranial hemorrhage, mass, midline  shift, or extra-axial fluid collection. 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 Cerebral white matter hypodensities are similar to the prior study and nonspecific but compatible with mild chronic  small vessel ischemic disease. There is mild cerebral atrophy. Vascular: Calcified atherosclerosis at the skull base. No hyperdense vessel. Skull: No fracture or focal osseous lesion. Sinuses/Orbits: Left sphenoid sinus mucous retention cyst or polyp. Clear mastoid air cells. Bilateral cataract extraction. Other: None. IMPRESSION: 1. No evidence of acute intracranial abnormality. 2. Mild chronic small vessel ischemic disease. Electronically Signed   By: Logan Bores M.D.   On: 01/13/2018 11:52   US Abdomen Limited  Result Date: 01/10/2018 CLINICAL DATA:  Hepatocellular carcinoma. EXAM: ULTRASOUND ABDOMEN LIMITED RIGHT UPPER QUADRANT COMPARISON:  CT scan of December 10, 2017. Ultrasound of September 16, 2017. FINDINGS: Gallbladder: Cholelithiasis is noted without gallbladder wall thickening or pericholecystic fluid. Sludge is also noted. Largest calculus measures 6 mm. Common bile duct: Diameter: 5 mm which is within normal limits. Liver: Heterogeneous echotexture of hepatic parenchyma is noted with nodular contours suggesting hepatic cirrhosis. Large mass measuring 8.2 x 7.5 x 6.8 cm is noted in dome of right hepatic lobe. Another mass measuring 6.9 x 6.4 x 5.6 cm is also noted in right hepatic lobe. Portal vein is patent on color Doppler imaging with normal direction of blood flow towards the liver. Moderate ascites is noted. Moderate right pleural effusion is noted as well. IMPRESSION: Two large right hepatic masses are again noted concerning for malignancy such as hepatocellular carcinoma. 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 Moderate ascites and moderate size right pleural effusion. Cholelithiasis and gallbladder sludge is noted. Electronically Signed   By: Marijo Conception, M.D.   On: 01/10/2018 08:36   Dg Chest Port 1 View  Result Date: 01/13/2018 CLINICAL DATA:  Altered mental status. EXAM: PORTABLE CHEST 1 VIEW COMPARISON:  Radiographs of October 16, 2017. FINDINGS: Stable cardiomegaly is  noted. Increased central pulmonary vascular congestion is noted with probable bilateral pulmonary edema. Mild bilateral pleural effusions are noted as well. No pneumothorax is noted. Bony thorax is unremarkable. IMPRESSION: Stable cardiomegaly with increased central pulmonary vascular congestion and probable bilateral pulmonary edema and pleural effusions. Electronically Signed   By: Marijo Conception, M.D.   On: 01/13/2018 12:13     Subjective: Patient has no new complaints this morning currently able to take orals.  Discharge Exam: Vitals:   01/14/18 2021 01/15/18 0533  BP: (!) 157/72 (!) 168/76  Pulse: 73 73  Resp: 16 16  Temp: 97.9 F (36.6 C) 97.8 F (36.6 C)  SpO2: 96% 96%   Vitals:   01/14/18 0918 01/14/18 1445 01/14/18 2021 01/15/18 0533  BP:  (!) 159/75 (!) 157/72 (!) 168/76  Pulse:  70 73 73  Resp:  19 16 16   Temp: 98.1 F (36.7 C)  97.9 F (36.6 C) 97.8 F (36.6 C)  TempSrc: Axillary  Oral Oral  SpO2:  97% 96% 96%    General: Pt is alert, awake, not in acute distress Cardiovascular: RRR, S1/S2 +, no rubs, no gallops Respiratory: CTA bilaterally, no wheezing, no rhonchi Abdominal: Soft, NT, ND, bowel sounds + Extremities: no edema, no cyanosis    The results of significant diagnostics from this hospitalization (including imaging, microbiology, ancillary and laboratory) are listed below for reference.     Microbiology: No results found for this or any previous visit (from the past 240 hour(s)).   Labs: BNP (last 3 results) Recent  Labs    09/16/17 1558  BNP 191.4*   Basic Metabolic Panel: Recent Labs  Lab 01/09/18 0810 01/13/18 1110 01/13/18 1118  NA 142 140 146*  K 3.8 4.7 3.0*  CL 104 107 112*  CO2 32 28  --   GLUCOSE 150* 83 62*  BUN 19 19 14   CREATININE 0.92 0.63 0.40*  CALCIUM 8.4* 7.7*  --    Liver Function Tests: Recent Labs  Lab 01/09/18 0810 01/13/18 1110  AST 139* 148*  ALT 182* 169*  ALKPHOS 331* 236*  BILITOT 4.5* 3.3*   PROT 6.7 6.0*  ALBUMIN 1.8* 1.8*   Recent Labs  Lab 01/13/18 1110  LIPASE 47   Recent Labs  Lab 01/13/18 1110  AMMONIA 61*   CBC: Recent Labs  Lab 01/09/18 0810 01/13/18 1110 01/13/18 1118  WBC 6.3 9.1  --   NEUTROABS 4.0 6.8  --   HGB 14.0 14.0 10.9*  HCT 42.7 42.8 32.0*  MCV 108.4* 110.3*  --   PLT 144* 152  --    Cardiac Enzymes: No results for input(s): CKTOTAL, CKMB, CKMBINDEX, TROPONINI in the last 168 hours. BNP: Invalid input(s): POCBNP CBG: No results for input(s): GLUCAP in the last 168 hours. D-Dimer No results for input(s): DDIMER in the last 72 hours. Hgb A1c No results for input(s): HGBA1C in the last 72 hours. Lipid Profile No results for input(s): CHOL, HDL, LDLCALC, TRIG, CHOLHDL, LDLDIRECT in the last 72 hours. Thyroid function studies No results for input(s): TSH, T4TOTAL, T3FREE, THYROIDAB in the last 72 hours.  Invalid input(s): FREET3 Anemia work up No results for input(s): VITAMINB12, FOLATE, FERRITIN, TIBC, IRON, RETICCTPCT in the last 72 hours. Urinalysis    Component Value Date/Time   COLORURINE AMBER (A) 01/13/2018 1030   APPEARANCEUR CLEAR 01/13/2018 1030   LABSPEC 1.018 01/13/2018 1030   PHURINE 7.0 01/13/2018 1030   GLUCOSEU NEGATIVE 01/13/2018 1030   HGBUR NEGATIVE 01/13/2018 1030   BILIRUBINUR NEGATIVE 01/13/2018 1030   KETONESUR NEGATIVE 01/13/2018 1030   PROTEINUR NEGATIVE 01/13/2018 1030   NITRITE NEGATIVE 01/13/2018 1030   LEUKOCYTESUR NEGATIVE 01/13/2018 1030   Sepsis Labs Invalid input(s): PROCALCITONIN,  WBC,  LACTICIDVEN Microbiology No results found for this or any previous visit (from the past 240 hour(s)).   Time coordinating discharge: 40 minutes  SIGNED:   Charlynne Cousins, MD  [facility] 01/15/2018, 8:56 AM Pager   If 7PM-7AM, please contact night-coverage [email or web address] Password [email or web address]

## 2018-01-15 NOTE — Care Management Note (Signed)
Case Management Note  Patient Details  Name: Devin Reeves MRN: [id number] Date of Birth: 06-24-1940  Subjective/Objective:                  Discharge planning  Action/Plan: To be transferred to [facility] today 01/15/2018  Expected Discharge Date:  01/15/18               Expected Discharge Plan:  [place]  In-House Referral:  Clinical Social Work  Discharge planning Services  CM Consult  Post Acute Care Choice:    Choice offered to:  Spouse  DME Arranged:    DME Agency:     HH Arranged:  RN [place] Agency:  [facility] [place] of Service:     If discussed at [employer], dates discussed:    Additional Comments:  [name], RN 01/15/2018, 10:14 AM

## 2018-01-17 ENCOUNTER — Other Ambulatory Visit: Payer: Medicare Other

## 2018-01-17 ENCOUNTER — Encounter: Payer: Medicare Other | Admitting: Nutrition

## 2018-01-17 ENCOUNTER — Ambulatory Visit: Payer: Medicare Other

## 2018-01-17 ENCOUNTER — Ambulatory Visit: Payer: Medicare Other | Admitting: Hematology

## 2018-01-28 ENCOUNTER — Telehealth: Payer: Self-pay

## 2018-01-28 NOTE — Telephone Encounter (Signed)
Received a call from patient's wife that he passed away at [facility] on 01/14/2018.

## 2018-01-29 DEATH — deceased

## 2018-01-31 ENCOUNTER — Other Ambulatory Visit: Payer: Medicare Other

## 2018-01-31 ENCOUNTER — Ambulatory Visit: Payer: Medicare Other | Admitting: Adult Health

## 2018-01-31 ENCOUNTER — Ambulatory Visit: Payer: Medicare Other

## 2019-10-28 IMAGING — CT CT HEAD W/O CM
3 series · 15 of 47 positions shown, 18 images · non-contrast
Comparison: None.

CLINICAL DATA: Altered level of consciousness and gait disturbance

EXAM:
CT HEAD WITHOUT CONTRAST
TECHNIQUE: Contiguous axial images were obtained from the base of the skull
through the vertex without intravenous contrast.

[Series 2: head wo · axial · 0.50mm/px · z∈[-103,+32]mm · 9 of 33 slices shown, 12 images]
[im 3/33  brain]
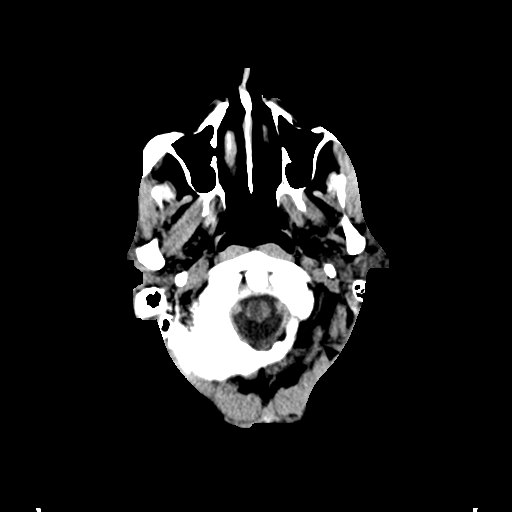
[im 3/33  bone]
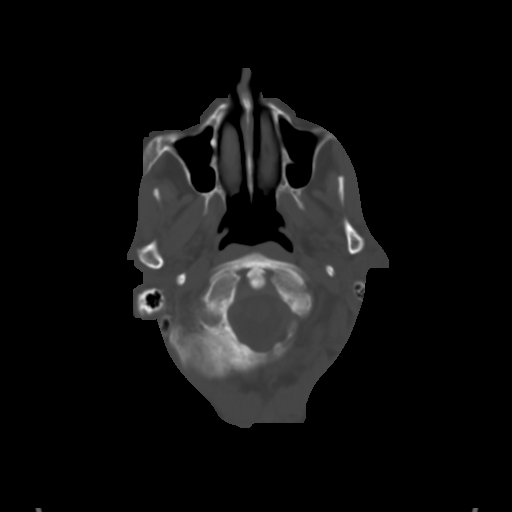
[im 6/33  brain]
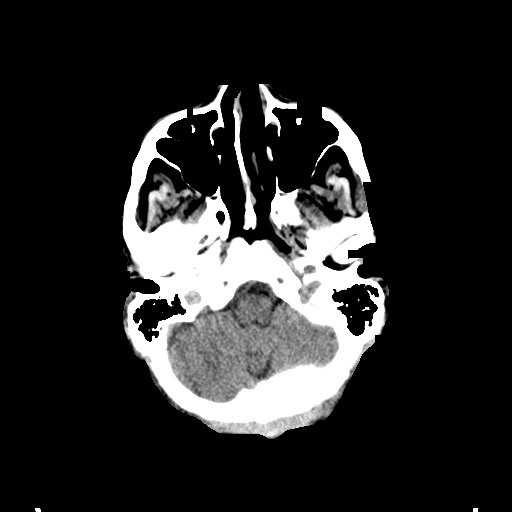
[im 9/33  brain]
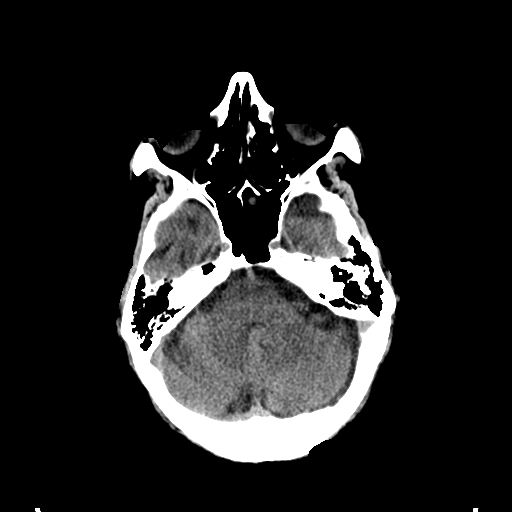
[im 13/33  brain]
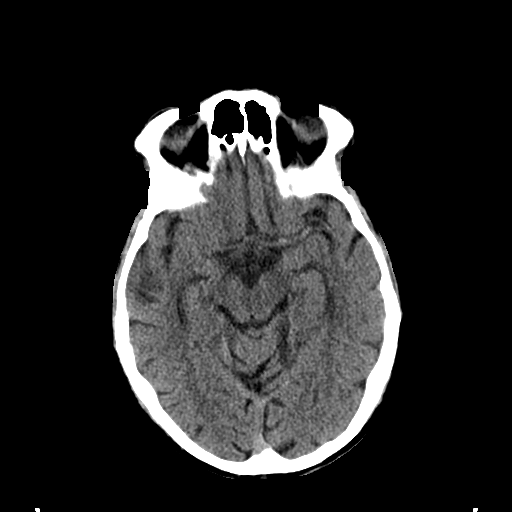
[im 17/33  brain]
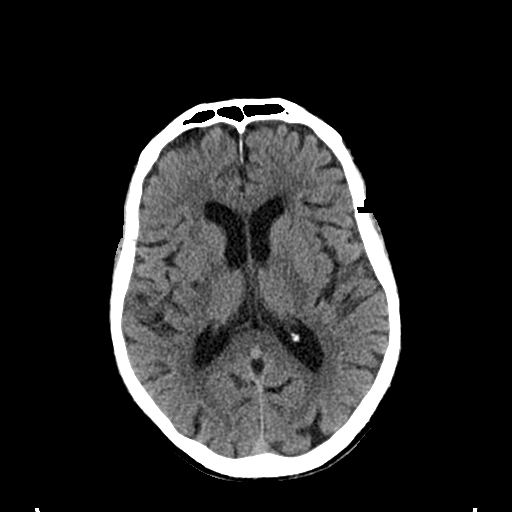
[im 17/33  bone]
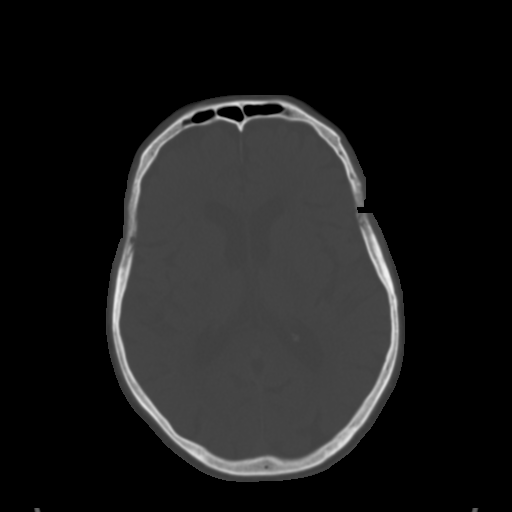
[im 20/33  brain]
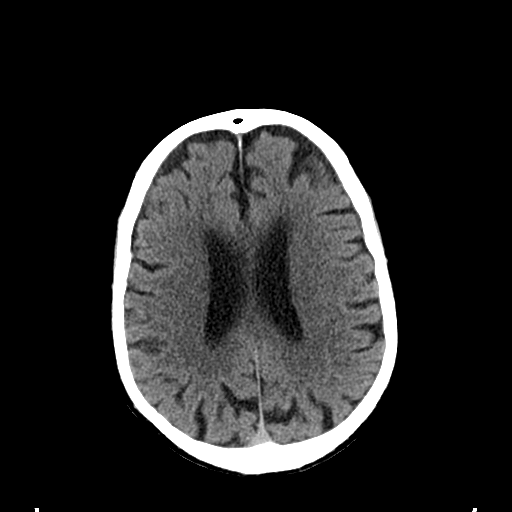
[im 24/33  brain]
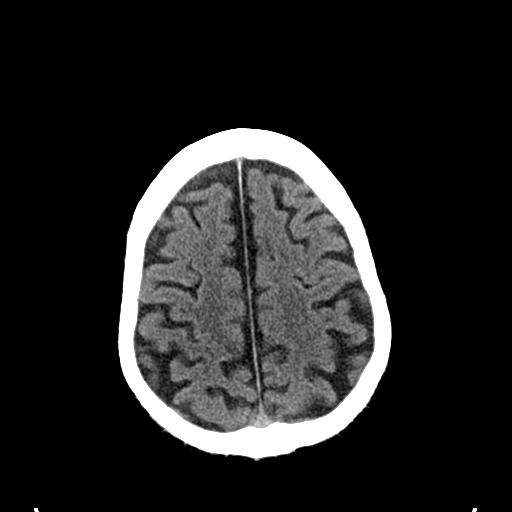
[im 27/33  brain]
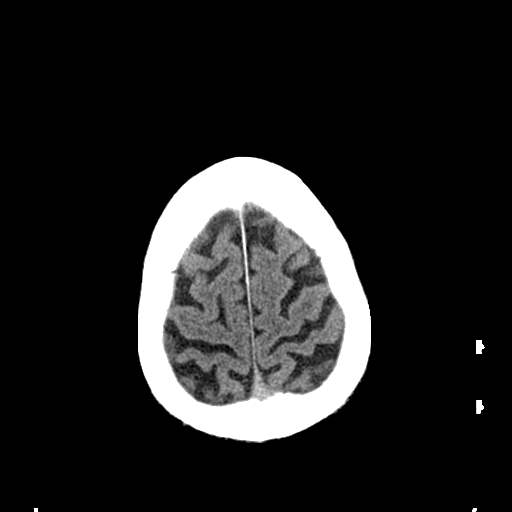
[im 30/33  brain]
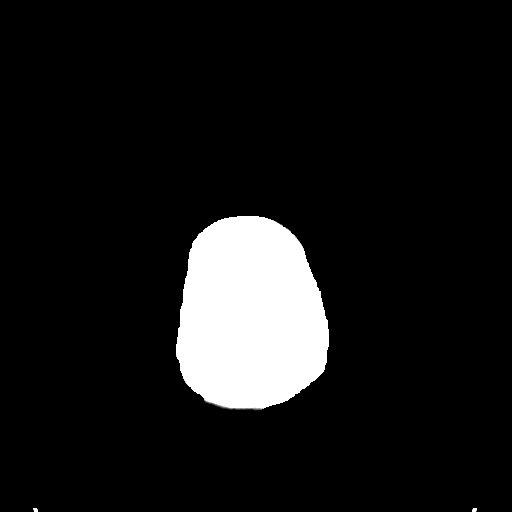
[im 30/33  bone]
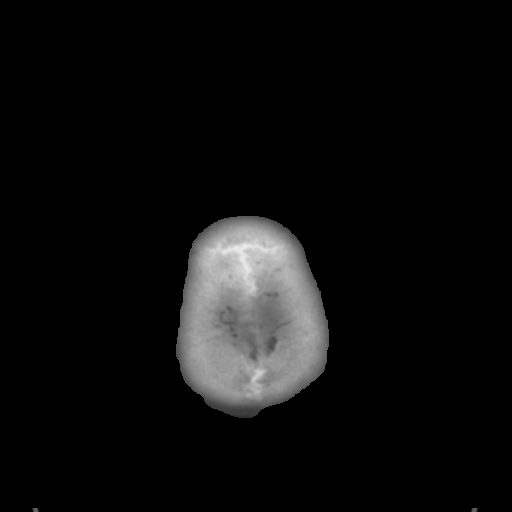

[Series 4: coronal soft tissue · coronal · 0.30mm/px · 3 of 70 slices shown]
[im 24/70  brain]
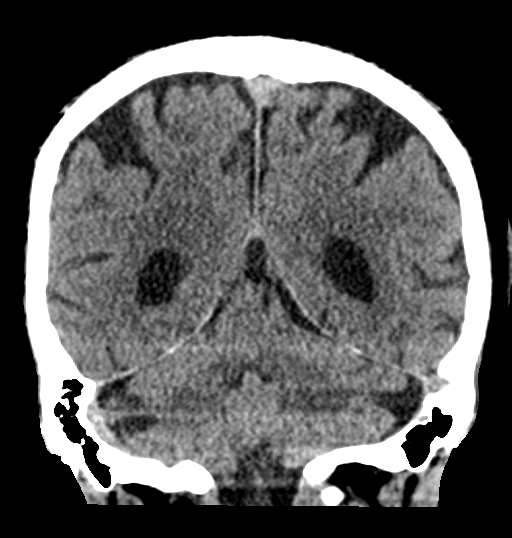
[im 31/70  brain]
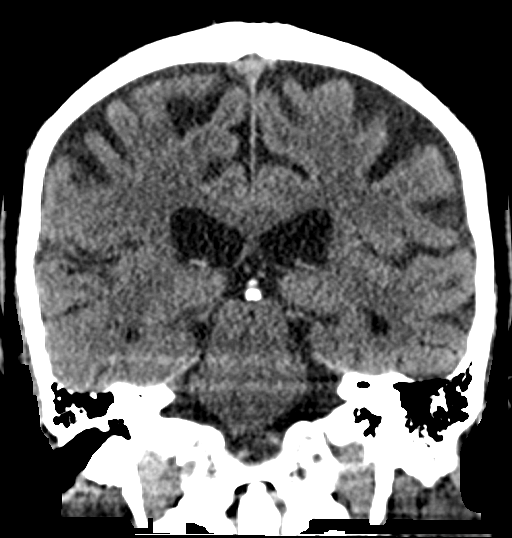
[im 39/70  brain]
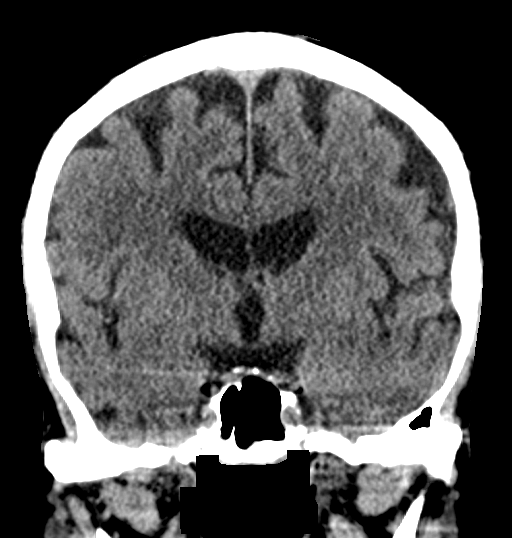

[Series 5: sagittal soft tissue · sagittal · 0.32mm/px · 3 of 52 slices shown]
[im 18/52  brain]
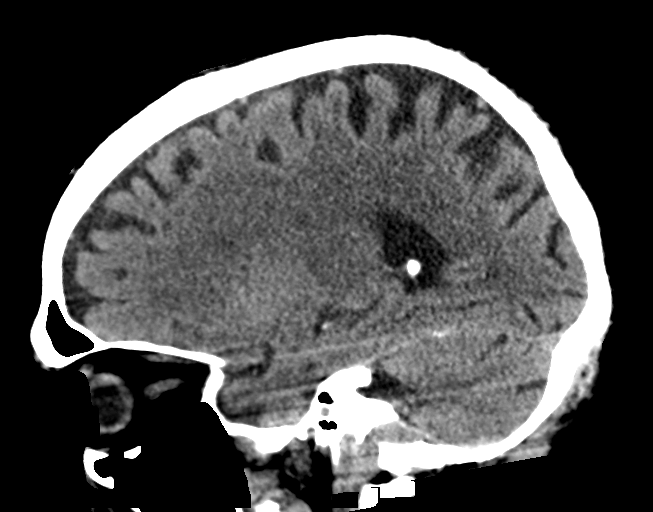
[im 26/52  brain]
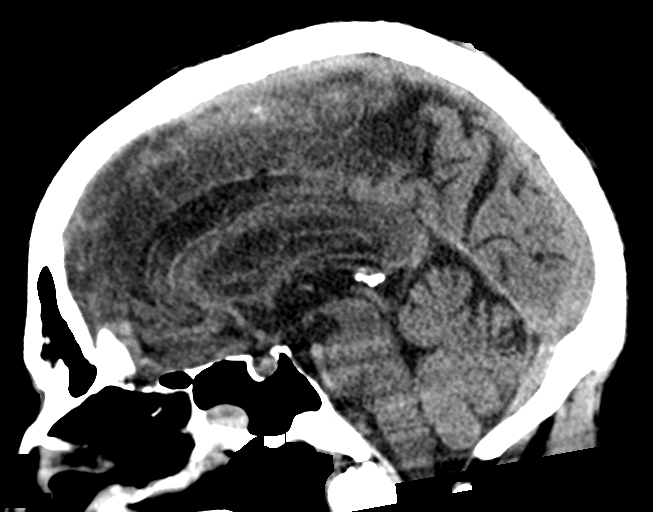
[im 35/52  brain]
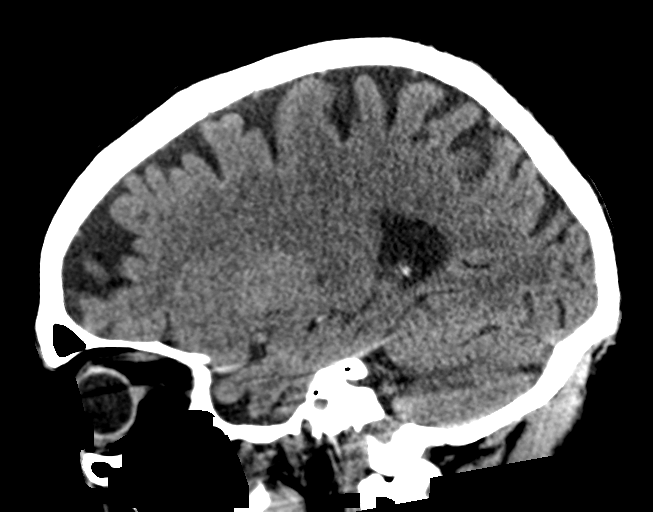

[15 of 47 positions shown; findings below may reference images not displayed]

FINDINGS: Brain: There is mild to moderate diffuse atrophy. There is no
intracranial mass, hemorrhage, extra-axial fluid collection or
midline shift. There is patchy small vessel disease in the centra
semiovale bilaterally. Elsewhere gray-white compartments appear
normal. No evident acute infarct.

Vascular: No hyperdense vessels. There is calcification in each
cavernous carotid artery region.

Skull: The bony calvarium appears intact.

Sinuses/Orbits: There is mucosal thickening in the inferior left
maxillary antrum. There is mucosal thickening in several ethmoid air
cells. There is a retention cyst in the anterior left sphenoid
sinus. Orbits appear symmetric bilaterally.

Other: Mastoid air cells are clear.
IMPRESSION: Atrophy with patchy supratentorial small vessel disease. No acute
infarct evident. No mass or hemorrhage.

Mild arterial vascular calcification noted. Multiple foci of
paranasal sinus disease.
